# Patient Record
Sex: Female | Born: 1935 | Race: White | Hispanic: No | Smoking: Never smoker
Health system: Southern US, Community
[De-identification: ages and names within clinical notes are randomized; demographics above are authoritative.]

## PROBLEM LIST (undated history)

## (undated) DIAGNOSIS — I340 Nonrheumatic mitral (valve) insufficiency: Secondary | ICD-10-CM

## (undated) DIAGNOSIS — K5790 Diverticulosis of intestine, part unspecified, without perforation or abscess without bleeding: Secondary | ICD-10-CM

## (undated) DIAGNOSIS — D649 Anemia, unspecified: Secondary | ICD-10-CM

## (undated) DIAGNOSIS — L4050 Arthropathic psoriasis, unspecified: Secondary | ICD-10-CM

## (undated) DIAGNOSIS — Z86010 Personal history of colonic polyps: Secondary | ICD-10-CM

## (undated) DIAGNOSIS — Z860101 Personal history of adenomatous and serrated colon polyps: Secondary | ICD-10-CM

## (undated) DIAGNOSIS — C4491 Basal cell carcinoma of skin, unspecified: Secondary | ICD-10-CM

## (undated) DIAGNOSIS — M1990 Unspecified osteoarthritis, unspecified site: Secondary | ICD-10-CM

## (undated) DIAGNOSIS — M810 Age-related osteoporosis without current pathological fracture: Secondary | ICD-10-CM

## (undated) DIAGNOSIS — I10 Essential (primary) hypertension: Secondary | ICD-10-CM

## (undated) DIAGNOSIS — D126 Benign neoplasm of colon, unspecified: Secondary | ICD-10-CM

## (undated) DIAGNOSIS — K625 Hemorrhage of anus and rectum: Secondary | ICD-10-CM

## (undated) DIAGNOSIS — M064 Inflammatory polyarthropathy: Secondary | ICD-10-CM

## (undated) HISTORY — PX: BREAST EXCISIONAL BIOPSY: SUR124

## (undated) HISTORY — DX: Inflammatory polyarthropathy: M06.4

## (undated) HISTORY — DX: Hemorrhage of anus and rectum: K62.5

## (undated) HISTORY — DX: Arthropathic psoriasis, unspecified: L40.50

## (undated) HISTORY — DX: Personal history of adenomatous and serrated colon polyps: Z86.0101

## (undated) HISTORY — DX: Benign neoplasm of colon, unspecified: D12.6

## (undated) HISTORY — DX: Nonrheumatic mitral (valve) insufficiency: I34.0

## (undated) HISTORY — PX: COLONOSCOPY: SHX174

## (undated) HISTORY — DX: Unspecified osteoarthritis, unspecified site: M19.90

## (undated) HISTORY — PX: ESOPHAGOGASTRODUODENOSCOPY: SHX1529

## (undated) HISTORY — DX: Diverticulosis of intestine, part unspecified, without perforation or abscess without bleeding: K57.90

## (undated) HISTORY — PX: ABDOMINAL HYSTERECTOMY: SHX81

## (undated) HISTORY — DX: Basal cell carcinoma of skin, unspecified: C44.91

## (undated) HISTORY — PX: SKIN BIOPSY: SHX1

## (undated) HISTORY — DX: Age-related osteoporosis without current pathological fracture: M81.0

## (undated) HISTORY — DX: Personal history of colonic polyps: Z86.010

## (undated) HISTORY — DX: Essential (primary) hypertension: I10

## (undated) HISTORY — DX: Anemia, unspecified: D64.9

---

## 2004-05-13 ENCOUNTER — Ambulatory Visit: Payer: Self-pay | Admitting: Internal Medicine

## 2004-09-29 ENCOUNTER — Encounter: Payer: Self-pay | Admitting: Rheumatology

## 2004-10-14 ENCOUNTER — Encounter: Payer: Self-pay | Admitting: Rheumatology

## 2005-01-22 ENCOUNTER — Ambulatory Visit: Payer: Self-pay | Admitting: Ophthalmology

## 2005-01-28 ENCOUNTER — Ambulatory Visit: Payer: Self-pay | Admitting: Ophthalmology

## 2005-08-27 ENCOUNTER — Ambulatory Visit: Payer: Self-pay | Admitting: Internal Medicine

## 2005-09-07 ENCOUNTER — Ambulatory Visit: Payer: Self-pay | Admitting: Internal Medicine

## 2006-02-11 ENCOUNTER — Ambulatory Visit: Payer: Self-pay | Admitting: Ophthalmology

## 2006-02-17 ENCOUNTER — Other Ambulatory Visit: Payer: Self-pay

## 2006-02-17 ENCOUNTER — Ambulatory Visit: Payer: Self-pay | Admitting: Ophthalmology

## 2006-09-10 ENCOUNTER — Ambulatory Visit: Payer: Self-pay | Admitting: Internal Medicine

## 2006-12-06 ENCOUNTER — Ambulatory Visit: Payer: Self-pay | Admitting: Unknown Physician Specialty

## 2007-09-12 ENCOUNTER — Ambulatory Visit: Payer: Self-pay | Admitting: Internal Medicine

## 2008-09-12 ENCOUNTER — Ambulatory Visit: Payer: Self-pay | Admitting: Internal Medicine

## 2008-12-18 ENCOUNTER — Ambulatory Visit: Payer: Self-pay | Admitting: Unknown Physician Specialty

## 2009-11-27 ENCOUNTER — Other Ambulatory Visit: Payer: Self-pay | Admitting: Internal Medicine

## 2009-12-19 ENCOUNTER — Ambulatory Visit: Payer: Self-pay | Admitting: Internal Medicine

## 2010-03-16 HISTORY — PX: BREAST BIOPSY: SHX20

## 2011-01-22 ENCOUNTER — Ambulatory Visit: Payer: Self-pay | Admitting: Internal Medicine

## 2011-02-02 ENCOUNTER — Ambulatory Visit: Payer: Self-pay | Admitting: Internal Medicine

## 2011-03-03 ENCOUNTER — Ambulatory Visit: Payer: Self-pay | Admitting: Surgery

## 2011-03-17 HISTORY — PX: BREAST BIOPSY: SHX20

## 2011-03-30 ENCOUNTER — Ambulatory Visit: Payer: Self-pay | Admitting: Surgery

## 2011-04-03 LAB — PATHOLOGY REPORT

## 2012-01-25 ENCOUNTER — Ambulatory Visit: Payer: Self-pay | Admitting: Internal Medicine

## 2012-06-02 ENCOUNTER — Ambulatory Visit: Payer: Self-pay | Admitting: Unknown Physician Specialty

## 2013-01-25 ENCOUNTER — Ambulatory Visit: Payer: Self-pay | Admitting: Internal Medicine

## 2013-08-18 DIAGNOSIS — I10 Essential (primary) hypertension: Secondary | ICD-10-CM | POA: Diagnosis present

## 2013-08-18 DIAGNOSIS — I160 Hypertensive urgency: Secondary | ICD-10-CM | POA: Diagnosis present

## 2014-02-20 ENCOUNTER — Ambulatory Visit: Payer: Self-pay | Admitting: Internal Medicine

## 2014-07-08 NOTE — Op Note (Signed)
PATIENT NAME:  Lisa Koch, Lisa Koch MR#:  [id number] DATE OF BIRTH:  [birth year]  DATE OF PROCEDURE:  03/30/2011  PREOPERATIVE DIAGNOSIS: Left breast mass.   POSTOPERATIVE DIAGNOSIS: Left breast mass.   PROCEDURE: Excision of left breast mass.   SURGEON:  Rochel Brome, MD  ANESTHESIA: Local 1% Xylocaine with monitored anesthesia care.   INDICATIONS: This 81 year old female had a mammogram depicting a small nodule in the upper retroareolar portion of the left breast. She had a core needle biopsy which was suspicious of a lymphoproliferative disorder at this site possibly an intramammary lymph node and excision was recommended for further evaluation.   DESCRIPTION OF PROCEDURE: The patient had preoperative x-ray needle localization. The mammogram images were placed on a light box and viewed in the operating room prior to surgery. The Kopans wire entered the upper aspect left breast and extended towards the retroareolar portion of the breast. The dressing was removed from the left breast exposing the Kopans wire, which was cut 3 cm from the skin. The breast and surrounding chest wall were prepared with ChloraPrep and draped in a sterile manner.   After some intravenous sedation, the skin at the periareolar portion of the left breast from approximately 10:00 to 2:00 was infiltrated with 1% Xylocaine. Next a curvilinear incision was made just above the border of the areola and carried down through subcutaneous tissues to encounter the wire. Next, a sample of tissue which was directly behind the thick portion of the wire was excised. There was some firmness within the tissue demonstrated and a sample of tissue approximately 2 x 2 x 2 cm in dimension was removed. This included some fatty tissue and also some more dense breast tissue. Tissues were submitted for specimen mammogram and for routine pathology. The wound was inspected. Several small bleeding points were cauterized. Hemostasis was subsequently intact.  The wound was closed with a running 4-0 chromic subcuticular suture and Dermabond.   The patient tolerated surgery satisfactorily and was then prepared for transfer to the recovery room.    ____________________________ Lenna Sciara. Rochel Brome, MD [id number] D: 03/30/2011 11:59:07 ET T: 03/30/2011 12:56:19 ET JOB#: [id number]  cc: Loreli Dollar, MD, <Dictator> Loreli Dollar MD ELECTRONICALLY SIGNED 04/19/2011 15:08

## 2014-12-26 ENCOUNTER — Other Ambulatory Visit: Payer: Self-pay | Admitting: Internal Medicine

## 2014-12-26 DIAGNOSIS — Z1231 Encounter for screening mammogram for malignant neoplasm of breast: Secondary | ICD-10-CM

## 2015-02-22 ENCOUNTER — Other Ambulatory Visit: Payer: Self-pay | Admitting: Internal Medicine

## 2015-02-22 ENCOUNTER — Ambulatory Visit
Admission: RE | Admit: 2015-02-22 | Discharge: 2015-02-22 | Disposition: A | Discharge status: Home / Self Care | Payer: Commercial Managed Care - HMO | Source: Ambulatory Visit | Attending: Internal Medicine | Admitting: Internal Medicine

## 2015-02-22 DIAGNOSIS — Z1231 Encounter for screening mammogram for malignant neoplasm of breast: Secondary | ICD-10-CM

## 2015-02-22 DIAGNOSIS — Z9889 Other specified postprocedural states: Secondary | ICD-10-CM | POA: Insufficient documentation

## 2016-01-09 ENCOUNTER — Other Ambulatory Visit: Payer: Self-pay | Admitting: Internal Medicine

## 2016-01-09 DIAGNOSIS — R1031 Right lower quadrant pain: Secondary | ICD-10-CM

## 2016-01-09 DIAGNOSIS — R1032 Left lower quadrant pain: Principal | ICD-10-CM

## 2016-01-13 ENCOUNTER — Other Ambulatory Visit: Payer: Self-pay | Admitting: Internal Medicine

## 2016-01-13 DIAGNOSIS — R1010 Upper abdominal pain, unspecified: Secondary | ICD-10-CM

## 2016-01-14 ENCOUNTER — Ambulatory Visit
Admission: RE | Admit: 2016-01-14 | Discharge: 2016-01-14 | Disposition: A | Discharge status: Home / Self Care | Payer: Commercial Managed Care - HMO | Source: Ambulatory Visit | Attending: Internal Medicine | Admitting: Internal Medicine

## 2016-01-14 DIAGNOSIS — K8020 Calculus of gallbladder without cholecystitis without obstruction: Secondary | ICD-10-CM | POA: Diagnosis not present

## 2016-01-14 DIAGNOSIS — R1010 Upper abdominal pain, unspecified: Secondary | ICD-10-CM

## 2016-01-15 ENCOUNTER — Other Ambulatory Visit: Payer: Self-pay | Admitting: Internal Medicine

## 2016-01-15 DIAGNOSIS — Z1231 Encounter for screening mammogram for malignant neoplasm of breast: Secondary | ICD-10-CM

## 2016-02-24 ENCOUNTER — Ambulatory Visit
Admission: RE | Admit: 2016-02-24 | Discharge: 2016-02-24 | Disposition: A | Discharge status: Home / Self Care | Payer: Commercial Managed Care - HMO | Source: Ambulatory Visit | Attending: Internal Medicine | Admitting: Internal Medicine

## 2016-02-24 DIAGNOSIS — Z1231 Encounter for screening mammogram for malignant neoplasm of breast: Secondary | ICD-10-CM | POA: Diagnosis not present

## 2017-01-18 ENCOUNTER — Other Ambulatory Visit: Payer: Self-pay | Admitting: Internal Medicine

## 2017-01-18 DIAGNOSIS — Z1231 Encounter for screening mammogram for malignant neoplasm of breast: Secondary | ICD-10-CM

## 2017-03-12 ENCOUNTER — Ambulatory Visit
Admission: RE | Admit: 2017-03-12 | Discharge: 2017-03-12 | Disposition: A | Discharge status: Home / Self Care | Payer: Medicare HMO | Source: Ambulatory Visit | Attending: Internal Medicine | Admitting: Internal Medicine

## 2017-03-12 DIAGNOSIS — Z1231 Encounter for screening mammogram for malignant neoplasm of breast: Secondary | ICD-10-CM | POA: Diagnosis present

## 2018-01-27 ENCOUNTER — Other Ambulatory Visit: Payer: Self-pay | Admitting: Internal Medicine

## 2018-01-27 DIAGNOSIS — Z1231 Encounter for screening mammogram for malignant neoplasm of breast: Secondary | ICD-10-CM

## 2018-03-14 ENCOUNTER — Ambulatory Visit
Admission: RE | Admit: 2018-03-14 | Discharge: 2018-03-14 | Disposition: A | Discharge status: Home / Self Care | Payer: Medicare HMO | Source: Ambulatory Visit | Attending: Internal Medicine | Admitting: Internal Medicine

## 2018-03-14 DIAGNOSIS — Z1231 Encounter for screening mammogram for malignant neoplasm of breast: Secondary | ICD-10-CM | POA: Insufficient documentation

## 2019-02-15 ENCOUNTER — Other Ambulatory Visit: Payer: Self-pay | Admitting: Internal Medicine

## 2019-02-15 DIAGNOSIS — Z1231 Encounter for screening mammogram for malignant neoplasm of breast: Secondary | ICD-10-CM

## 2019-03-16 ENCOUNTER — Ambulatory Visit
Admission: RE | Admit: 2019-03-16 | Discharge: 2019-03-16 | Disposition: A | Discharge status: Home / Self Care | Payer: Medicare HMO | Source: Ambulatory Visit | Attending: Internal Medicine | Admitting: Internal Medicine

## 2019-03-16 DIAGNOSIS — Z1231 Encounter for screening mammogram for malignant neoplasm of breast: Secondary | ICD-10-CM

## 2020-02-05 ENCOUNTER — Other Ambulatory Visit: Payer: Self-pay | Admitting: Internal Medicine

## 2020-02-05 DIAGNOSIS — Z1231 Encounter for screening mammogram for malignant neoplasm of breast: Secondary | ICD-10-CM

## 2020-04-08 ENCOUNTER — Other Ambulatory Visit: Payer: Self-pay

## 2020-04-08 ENCOUNTER — Ambulatory Visit
Admission: RE | Admit: 2020-04-08 | Discharge: 2020-04-08 | Disposition: A | Discharge status: Home / Self Care | Payer: Medicare HMO | Source: Ambulatory Visit | Attending: Internal Medicine | Admitting: Internal Medicine

## 2020-04-08 DIAGNOSIS — Z1231 Encounter for screening mammogram for malignant neoplasm of breast: Secondary | ICD-10-CM | POA: Diagnosis present

## 2020-08-27 ENCOUNTER — Other Ambulatory Visit: Payer: Self-pay | Admitting: Internal Medicine

## 2020-08-27 DIAGNOSIS — M71352 Other bursal cyst, left hip: Secondary | ICD-10-CM

## 2020-08-31 ENCOUNTER — Emergency Department: Payer: Medicare HMO

## 2020-08-31 ENCOUNTER — Other Ambulatory Visit: Payer: Self-pay

## 2020-08-31 DIAGNOSIS — S59902A Unspecified injury of left elbow, initial encounter: Secondary | ICD-10-CM | POA: Diagnosis present

## 2020-08-31 DIAGNOSIS — S52022A Displaced fracture of olecranon process without intraarticular extension of left ulna, initial encounter for closed fracture: Secondary | ICD-10-CM | POA: Diagnosis not present

## 2020-08-31 DIAGNOSIS — W01198A Fall on same level from slipping, tripping and stumbling with subsequent striking against other object, initial encounter: Secondary | ICD-10-CM | POA: Insufficient documentation

## 2020-08-31 DIAGNOSIS — Z7982 Long term (current) use of aspirin: Secondary | ICD-10-CM | POA: Insufficient documentation

## 2020-08-31 DIAGNOSIS — Z79899 Other long term (current) drug therapy: Secondary | ICD-10-CM | POA: Diagnosis not present

## 2020-08-31 DIAGNOSIS — S0990XA Unspecified injury of head, initial encounter: Secondary | ICD-10-CM | POA: Insufficient documentation

## 2020-08-31 DIAGNOSIS — S51012A Laceration without foreign body of left elbow, initial encounter: Secondary | ICD-10-CM | POA: Diagnosis not present

## 2020-08-31 DIAGNOSIS — I10 Essential (primary) hypertension: Secondary | ICD-10-CM | POA: Insufficient documentation

## 2020-08-31 DIAGNOSIS — Z23 Encounter for immunization: Secondary | ICD-10-CM | POA: Diagnosis not present

## 2020-08-31 NOTE — ED Triage Notes (Signed)
Pt in from home after fall; got tripped up over a dog and fell on concrete. Pt denies blood thinners. Pt's left elbow wrapped in gauze due to injury; swollen and bleeding per son. Per son, pt has been acting her normal self since fall. Pt has hematoma x2 to back of head per family. Fall was unwitnessed per family. Pt's speech clear; pupils round equal and reactive; this RN assessed back of head--noted hematoma; no bleeding noted at head. Pt c/o pain at elbow but denies anywhere else. Pt denies HA and CP.

## 2020-09-01 ENCOUNTER — Emergency Department: Payer: Medicare HMO

## 2020-09-01 ENCOUNTER — Emergency Department
Admission: EM | Admit: 2020-09-01 | Discharge: 2020-09-01 | Disposition: A | Discharge status: Home / Self Care | Payer: Medicare HMO | Attending: Emergency Medicine | Admitting: Emergency Medicine

## 2020-09-01 DIAGNOSIS — S51012A Laceration without foreign body of left elbow, initial encounter: Secondary | ICD-10-CM

## 2020-09-01 DIAGNOSIS — W19XXXA Unspecified fall, initial encounter: Secondary | ICD-10-CM

## 2020-09-01 DIAGNOSIS — S52022A Displaced fracture of olecranon process without intraarticular extension of left ulna, initial encounter for closed fracture: Secondary | ICD-10-CM

## 2020-09-01 DIAGNOSIS — S0990XA Unspecified injury of head, initial encounter: Secondary | ICD-10-CM

## 2020-09-01 MED ORDER — ONDANSETRON 4 MG PO TBDP
4.0000 mg | ORAL_TABLET | Freq: Once | ORAL | Status: AC
  Administered 2020-09-01: 4 mg via ORAL
  Filled 2020-09-01: qty 1

## 2020-09-01 MED ORDER — ONDANSETRON 4 MG PO TBDP: 4.0000 mg | ORAL_TABLET | Freq: Four times a day (QID) | ORAL | 0 refills | Status: DC | PRN

## 2020-09-01 MED ORDER — TETANUS-DIPHTH-ACELL PERTUSSIS 5-2.5-18.5 LF-MCG/0.5 IM SUSY
0.5000 mL | PREFILLED_SYRINGE | Freq: Once | INTRAMUSCULAR | Status: AC
  Administered 2020-09-01: 0.5 mL via INTRAMUSCULAR
  Filled 2020-09-01: qty 0.5

## 2020-09-01 MED ORDER — BACITRACIN ZINC 500 UNIT/GM EX OINT
TOPICAL_OINTMENT | Freq: Once | CUTANEOUS | Status: AC
  Filled 2020-09-01: qty 1.8

## 2020-09-01 MED ORDER — HYDROCODONE-ACETAMINOPHEN 5-325 MG PO TABS
1.0000 | ORAL_TABLET | Freq: Once | ORAL | Status: AC
  Administered 2020-09-01: 1 via ORAL
  Filled 2020-09-01: qty 1

## 2020-09-01 MED ORDER — HYDROCODONE-ACETAMINOPHEN 5-325 MG PO TABS: 1.0000 | ORAL_TABLET | ORAL | 0 refills | Status: DC | PRN

## 2020-09-01 NOTE — Discharge Instructions (Addendum)
CT of your head and cervical spine showed no significant injury.  X-rays of your left shoulder and hand were normal today.  You do have a fracture and skin tear to the left elbow.  Please keep your splint on at all times and keep it clean and dry.  You may use your sling as needed for comfort.  Please contact orthopedics on Monday morning to schedule outpatient follow-up.   You are being provided a prescription for opiates (also known as narcotics) for pain control.  Opiates can be addictive and should only be used when absolutely necessary for pain control when other alternatives do not work.  We recommend you only use them for the recommended amount of time and only as prescribed.  Please do not take with other sedative medications or alcohol.  Please do not drive, operate machinery, make important decisions while taking opiates.  Please note that these medications can be addictive and have high abuse potential.  Patients can become addicted to narcotics after only taking them for a few days.  Please keep these medications locked away from children, teenagers or any family members with history of substance abuse.  Narcotic pain medicine may also make you constipated.  You may use over-the-counter medications such as MiraLAX, Colace to prevent constipation.  If you become constipated you may use over-the-counter enemas as needed.  Itching and nausea are common side effects of narcotic pain medication.  If you develop uncontrolled vomiting or a rash, please stop these medications.

## 2020-09-01 NOTE — ED Notes (Signed)
Discharge paperwork reviewed and discussed with patient and son. Denies any needs or questions at this time. Educated on the need for close follow up with PCP and orthopedics. Patient to lobby via wheelchair

## 2020-09-01 NOTE — ED Provider Notes (Signed)
[facility] Emergency Department Provider Note ____________________________________________   Event Date/Time   First MD Initiated Contact with Patient 09/01/20 [phone]     (approximate)  I have reviewed the triage vital signs and the nursing notes.   HISTORY  Chief Complaint Fall    HPI Lisa Koch is a 87 y.o. female with history of hypertension, psoriatic arthropathy, hearing loss who presents to the emergency department after she fell tonight after she tripped over her neighbors dog.  Complains of left elbow pain.  Had a skin tear to this area that was bandaged by her son.  Did hit her head but did not lose consciousness.  On aspirin but no other blood thinners.  No neck or back pain.  No numbness, tingling or weakness.  No preceding symptoms that led to her fall.  No chest pain or shortness of breath.         History reviewed. No pertinent past medical history.  There are no problems to display for this patient.   Past Surgical History:  Procedure Laterality Date   BREAST BIOPSY Left 2012   core - neg   BREAST BIOPSY Left 2013   excisional - neg   BREAST EXCISIONAL BIOPSY      Prior to Admission medications   Medication Sig Start Date End Date Taking? Authorizing Provider  HYDROcodone-acetaminophen (NORCO/VICODIN) 5-325 MG tablet Take 1 tablet by mouth every 4 (four) hours as needed. 09/01/20  Yes Naziyah Tieszen, Cyril Mourning N, DO  ondansetron (ZOFRAN ODT) 4 MG disintegrating tablet Take 1 tablet (4 mg total) by mouth every 6 (six) hours as needed for nausea or vomiting. 09/01/20  Yes Mylah Baynes, Delice Bison, DO  aspirin 81 MG EC tablet Take 81 mg by mouth daily.    [provider]  folic acid (FOLVITE) 1 MG tablet Take 2 mg by mouth daily.    [provider]  hydrochlorothiazide (HYDRODIURIL) 12.5 MG tablet Take 12.5 mg by mouth every other day. 06/29/20   [provider]  lisinopril (ZESTRIL) 40 MG tablet Take 40 mg by mouth daily.  06/19/20   [provider]  methotrexate (RHEUMATREX) 2.5 MG tablet Take 10 mg by mouth once a week. 08/05/20   [provider]  omeprazole (PRILOSEC) 10 MG capsule Take 10 mg by mouth daily. 07/03/20   [provider]  Potassium Chloride ER 20 MEQ TBCR Take 20 mEq by mouth daily. 06/20/20   [provider]  verapamil (CALAN-SR) 240 MG CR tablet Take 240 mg by mouth daily. 06/17/20   [provider]    Allergies Patient has no allergy information on record.  Family History  Problem Relation Age of Onset   Breast cancer Neg Hx     Social History Social History   Tobacco Use   Smoking status: Never   Smokeless tobacco: Never  Substance Use Topics   Alcohol use: Never   Drug use: Never    Review of Systems Constitutional: No fever. Eyes: No visual changes. ENT: No sore throat. Cardiovascular: Denies chest pain. Respiratory: Denies shortness of breath. Gastrointestinal: No nausea, vomiting, diarrhea. Genitourinary: Negative for dysuria. Musculoskeletal: Negative for back pain. Skin: Negative for rash. Neurological: Negative for focal weakness or numbness.   ____________________________________________   PHYSICAL EXAM:  VITAL SIGNS: ED Triage Vitals  Enc Vitals Group     BP 08/31/20 2221 (!) 186/90     Pulse Rate 08/31/20 2221 65     Resp 08/31/20 2221 17  Temp 08/31/20 2221 98.2 F (36.8 C)     Temp Source 08/31/20 2221 Oral     SpO2 08/31/20 2221 99 %     Weight 08/31/20 2223 133 lb (60.3 kg)     Height 08/31/20 2223 5' (1.524 m)     Head Circumference --      Peak Flow --      Pain Score 08/31/20 2222 9     Pain Loc --      Pain Edu? --      Excl. in [place]? --    CONSTITUTIONAL: Alert and oriented and responds appropriately to questions. Well-appearing; well-nourished; GCS 19, elderly, hard of hearing HEAD: Normocephalic; hematoma to the posterior scalp, no laceration EYES: Conjunctivae clear, PERRL, EOMI ENT: normal  nose; no rhinorrhea; moist mucous membranes; pharynx without lesions noted; no dental injury; no septal hematoma NECK: Supple, no meningismus, no LAD; no midline spinal tenderness, step-off or deformity; trachea midline CARD: RRR; S1 and S2 appreciated; no murmurs, no clicks, no rubs, no gallops RESP: Normal chest excursion without splinting or tachypnea; breath sounds clear and equal bilaterally; no wheezes, no rhonchi, no rales; no hypoxia or respiratory distress CHEST:  chest wall stable, no crepitus or ecchymosis or deformity, nontender to palpation; no flail chest ABD/GI: Normal bowel sounds; non-distended; soft, non-tender, no rebound, no guarding; no ecchymosis or other lesions noted PELVIS:  stable, nontender to palpation, no leg length discrepancy BACK:  The back appears normal and is non-tender to palpation, there is no CVA tenderness; no midline spinal tenderness, step-off or deformity EXT: Patient has a skin tear to the left posterior elbow with associated ecchymosis, soft tissue swelling and bony tenderness without deformity.  2+ radial pulses bilaterally.  Patient also has bruising and tenderness to the left fifth digit.  Also tender to palpation without deformity, soft tissue swelling or ecchymosis to the left shoulder.  Full range of motion in all joints.  Otherwise extremities are nontender to palpation. SKIN: Normal color for age and race; warm NEURO: Moves all extremities equally, normal speech, normal gait PSYCH: The patient's mood and manner are appropriate. Grooming and personal hygiene are appropriate.      Left elbow  Patient gave verbal permission to utilize photo for medical documentation only. The image was not stored on any personal device.  ____________________________________________   LABS (all labs ordered are listed, but only abnormal results are displayed)  Labs Reviewed - No data to  display ____________________________________________  EKG   ____________________________________________  RADIOLOGY I, Deaken Jurgens, personally viewed and evaluated these images (plain radiographs) as part of my medical decision making, as well as reviewing the written report by the radiologist.  ED MD interpretation: X-ray showed fracture of the olecranon.  CT head and cervical spine show no acute abnormality.  Official radiology report(s): DG Elbow Complete Left  Result Date: 08/31/2020 CLINICAL DATA:  [place], left elbow pain EXAM: LEFT ELBOW - COMPLETE 3+ VIEW COMPARISON:  None. FINDINGS: Frontal, bilateral oblique, lateral views of the left elbow are obtained. There is a mildly comminuted fracture through the olecranon, with significant distraction. Fracture fragments are separated by approximately 15 mm. No dislocation. Small joint effusion. Prominent dorsal soft tissue swelling. IMPRESSION: 1. Mildly comminuted intra-articular fracture of the olecranon, with approximately 15 mm of distraction of the fracture fragments. 2. Small joint effusion. 3. Dorsal soft tissue swelling. Electronically Signed   By: Randa Ngo M.D.   On: 08/31/2020 23:14   CT Head Wo Contrast  Result  Date: 08/31/2020 CLINICAL DATA:  Tripped over dog at home leading to fall onto concrete. Head trauma. EXAM: CT HEAD WITHOUT CONTRAST TECHNIQUE: Contiguous axial images were obtained from the base of the skull through the vertex without intravenous contrast. COMPARISON:  None. FINDINGS: Brain: Age related atrophy. Moderate periventricular and deep chronic small vessel ischemia. No intracranial hemorrhage, mass effect, or midline shift. No hydrocephalus. The basilar cisterns are patent. No evidence of territorial infarct or acute ischemia. No extra-axial or intracranial fluid collection. Vascular: No hyperdense vessel. Skull: No fracture or focal lesion. Sinuses/Orbits: Paranasal sinuses and mastoid air cells are clear. The  visualized orbits are unremarkable. Bilateral cataract resection. Other: Left parietal scalp hematoma. IMPRESSION: 1. Left parietal scalp hematoma. No acute intracranial abnormality. No skull fracture. 2. Age related atrophy and chronic small vessel ischemia. Electronically Signed   By: Keith Rake M.D.   On: 08/31/2020 23:30   CT Cervical Spine Wo Contrast  Result Date: 08/31/2020 CLINICAL DATA:  Tripped over dog at home leading to fall onto concrete. Neck trauma. EXAM: CT CERVICAL SPINE WITHOUT CONTRAST TECHNIQUE: Multidetector CT imaging of the cervical spine was performed without intravenous contrast. Multiplanar CT image reconstructions were also generated. COMPARISON:  None. FINDINGS: Alignment: Reversal of upper lordosis with slight anterolisthesis of C2 on C3, C3 on C4, and C4 on C5. No jumped or perched facets. No traumatic subluxation. Skull base and vertebrae: No acute fracture. Vertebral body heights are maintained. The dens and skull base are intact. Soft tissues and spinal canal: No prevertebral fluid or swelling. No visible canal hematoma. Disc levels: Multilevel degenerative disc disease, most prominently affecting C5-C6 and C6-C7. There is multilevel facet hypertrophy. Upper chest: No acute or unexpected findings. Other: None. IMPRESSION: 1. No acute fracture or subluxation of the cervical spine. 2. Multilevel degenerative disc disease and facet hypertrophy. Electronically Signed   By: Keith Rake M.D.   On: 08/31/2020 23:34   DG Shoulder Left  Result Date: 09/01/2020 CLINICAL DATA:  87 year old female tripped over dog and fell on concrete. EXAM: LEFT SHOULDER - 2+ VIEW COMPARISON:  None available. FINDINGS: Bone mineralization is within normal limits for age. No glenohumeral joint dislocation. Proximal left humerus intact. Left clavicle and scapula appear intact with mild to moderate AC joint degeneration. Visible left ribs and lung appear negative. IMPRESSION: No acute fracture  or dislocation identified about the left shoulder. Electronically Signed   By: Genevie Ann M.D.   On: 09/01/2020 06:09   DG Hand Complete Left  Result Date: 09/01/2020 CLINICAL DATA:  87 year old female tripped over dog and fell on concrete. EXAM: LEFT HAND - COMPLETE 3+ VIEW COMPARISON:  None. FINDINGS: Arthropathic changes throughout the left hand. 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 Distal radius and ulna appear intact. No carpal bone fracture. Severe osteoarthritis at the [facility]st [facility] joint, thumb IP joint. No metacarpal or phalanx fracture identified. IMPRESSION: Advanced arthropathic changes in the left hand. No acute fracture or dislocation identified. Electronically Signed   By: Genevie Ann M.D.   On: 09/01/2020 06:08    ____________________________________________   PROCEDURES  Procedure(s) performed (including Critical Care):  Procedures  SPLINT APPLICATION Date/Time: 5:17 AM Authorized by: Cyril Mourning Ellanore Vanhook Consent: Verbal consent obtained. Risks and benefits: risks, benefits and alternatives were discussed Consent given by: patient Splint applied by: technician Location details: Left arm Splint type: Posterior splint with sling Supplies used: Fiberglas, cotton padding, Ace wrap, sling Post-procedure: The splinted body part was neurovascularly unchanged following the procedure. Patient tolerance:  Patient tolerated the procedure well with no immediate complications.   ____________________________________________   INITIAL IMPRESSION / ASSESSMENT AND PLAN / ED COURSE  As part of my medical decision making, I reviewed the following data within the [place] History obtained from family, Nursing notes reviewed and incorporated, Radiograph reviewed , Notes from prior ED visits, and [place] Controlled Substance Database         Patient here with mechanical fall.  Has left olecranon fracture.  Has overlying skin tear but no signs of an open fracture.  Neurovascularly intact  distally.  CT head and cervical spine showed no intracranial abnormality, cervical spine fracture or dislocation.  Will update tetanus vaccination, clean wound and apply sterile nonadherent dressing.  Will place patient in a posterior left long-arm splint with sling.  Complaining of left fifth digit pain as well as left shoulder pain.  Will obtain x-rays of these areas.  No other signs of traumatic injury on exam.  Will give pain medication.   ED PROGRESS  Patient's x-ray of the hand and shoulder show no acute traumatic injury.  Patient is in posterior splint with sling.  Continues to be neurovascularly intact distally.  Hemodynamically stable without complaints.  Pain well controlled with Vicodin.  Will discharge home with outpatient orthopedic follow-up and prescription for pain and nausea medicine.  Discussed return precautions with patient and son at bedside.  At this time, I do not feel there is any life-threatening condition present. I have reviewed, interpreted and discussed all results (EKG, imaging, lab, urine as appropriate) and exam findings with patient/family. I have reviewed nursing notes and appropriate previous records.  I feel the patient is safe to be discharged home without further emergent workup and can continue workup as an outpatient as needed. Discussed usual and customary return precautions. Patient/family verbalize understanding and are comfortable with this plan.  Outpatient follow-up has been provided as needed. All questions have been answered.    ____________________________________________   FINAL CLINICAL IMPRESSION(S) / ED DIAGNOSES  Final diagnoses:  Fall, initial encounter  Injury of head, initial encounter  Skin tear of left elbow without complication, initial encounter  Closed olecranon fracture, left, initial encounter     ED Discharge Orders          Ordered    HYDROcodone-acetaminophen (NORCO/VICODIN) 5-325 MG tablet  Every 4 hours PRN         09/01/20 0721    ondansetron (ZOFRAN ODT) 4 MG disintegrating tablet  Every 6 hours PRN        09/01/20 4098            *Please note:  Estelle June was evaluated in Emergency Department on 09/01/2020 for the symptoms described in the history of present illness. She was evaluated in the context of the global COVID-19 pandemic, which necessitated consideration that the patient might be at risk for infection with the SARS-CoV-2 virus that causes COVID-19. Institutional protocols and algorithms that pertain to the evaluation of patients at risk for COVID-19 are in a state of rapid change based on information released by regulatory bodies including the CDC and federal and state organizations. These policies and algorithms were followed during the patient's care in the ED.  Some ED evaluations and interventions may be delayed as a result of limited staffing during and the pandemic.*   Note:  This document was prepared using Dragon voice recognition software and may include unintentional dictation errors.    Meka Lewan, Delice Bison, DO 09/01/20  0724  

## 2020-09-03 ENCOUNTER — Ambulatory Visit: Payer: Self-pay | Admitting: *Deleted

## 2020-09-03 NOTE — Telephone Encounter (Signed)
Reason for Disposition  Small cut (scratch) or abrasion (scrape) is also present  Answer Assessment - Initial Assessment Questions 1. MECHANISM: "How did the injury happen?"     Son Lisa Koch calling in.   She fell on Saturday and broke her elbow.   Went to the ED.   There is a torn skin flap on her elbow.   I stopped her 81 mg of aspirin today.    She's on 2 BP medications.  2. ONSET: "When did the injury happen?" (Minutes or hours ago)      Saturday  She fell and broke her elbow 3. LOCATION: "What part of the elbow is the injured?"      It's broken       They drsged the wound in the ED.   It bled through in the ED so they reinforced it while in the ED. 4. APPEARANCE of INJURY: "What does the injury look like?"      I had to reinforce the drsg.     She has an appt with the ortho surgery guy tomorrow.    She's bleeding.   She was knocked down by a dog while watering her flowers on Saturday. 5. SEVERITY: "Can you use the elbow normally?"  "Can you bend it and straighten it fully?"     It's in a long arm splint.   6. SIZE: For cuts, bruises, or swelling, ask: "How large is it?" (e.g., inches or centimeters; entire joint)      It's bleeding.   I've reinforced the drsg.    7. PAIN: "Is there pain?" If Yes, ask: "How bad is the pain?"    (Scale 1-10; or mild, moderate, severe)   - NONE (0): no pain.   - MILD (1-3): doesn't interfere with normal activities.   - MODERATE (4-7): interferes with normal activities (e.g., work or school) or awakens from sleep.   - SEVERE (8-10): excruciating pain, unable to do any normal activities, unable to use arm at all.     Burning where skin is gone 8. TETANUS: For any breaks in the skin, ask: "When was the last tetanus booster?"     Seen in the ED 9. OTHER SYMPTOMS: "Do you have any other symptoms?"  (e.g., numbness in hand)     Denies having dizziness or weakness 10. PREGNANCY: "Is there any chance you are pregnant?" "When was your last menstrual period?"        N/A due to age  Protocols used: Elbow Injury-A-AH

## 2020-09-03 NOTE — Telephone Encounter (Signed)
Pt's son called in [place].  His mother was knocked down by a dog on Saturday.   She has a broken elbow and a skin flap that was torn during the fall.   She was treated in the ED at [facility].   She has a broken elbow and it's in a splint.   It is slowly bleeding through the drsg.   Jenny Reichmann has reinforced it.   "It's not saturated but it's a slow bleeding".   "I stopped her 81 mg a day aspirin today".    She is not on any other blood thinners.   Just 2 BP medications.   She has an appt tomorrow at 2:30 with the [facility].    I know the directions were not to disturb the splint or wound until seen by the ortho dr.     I let him know about the walk in clinic [facility] has that is open until 9:00 PM.  I suggested he take her there if she continued to bleed or started c/o being dizzy or weak.    "She is acting fine".   "She's really doing fantastic".   "Her only complaint is the wound where the skin has been torn is burning".   "There is a big loose flap of skin that was torn over the elbow".    "That's what was bleeding in the ED".   John was agreeable to taking her to the [facility] walk in clinic if it was needed.   He thanked me greatly for my help and advice.   He wasn't aware of the walk in clinic available through [facility].   He has to take his mother to an eye dr appt where she gets a special shot in her eye for macular degeneration todaay.

## 2020-09-05 ENCOUNTER — Ambulatory Visit: Payer: Medicare HMO

## 2021-03-03 ENCOUNTER — Other Ambulatory Visit: Payer: Self-pay | Admitting: Internal Medicine

## 2021-03-03 DIAGNOSIS — Z1231 Encounter for screening mammogram for malignant neoplasm of breast: Secondary | ICD-10-CM

## 2021-05-21 ENCOUNTER — Ambulatory Visit
Admission: RE | Admit: 2021-05-21 | Discharge: 2021-05-21 | Disposition: A | Discharge status: Home / Self Care | Payer: Medicare HMO | Source: Ambulatory Visit | Attending: Internal Medicine | Admitting: Internal Medicine

## 2021-05-21 ENCOUNTER — Other Ambulatory Visit: Payer: Self-pay

## 2021-05-21 DIAGNOSIS — Z1231 Encounter for screening mammogram for malignant neoplasm of breast: Secondary | ICD-10-CM | POA: Insufficient documentation

## 2021-07-02 DIAGNOSIS — Z9181 History of falling: Secondary | ICD-10-CM

## 2021-12-14 ENCOUNTER — Emergency Department
Admission: EM | Admit: 2021-12-14 | Discharge: 2021-12-14 | Disposition: A | Discharge status: Home / Self Care | Payer: Medicare HMO | Source: Home / Self Care | Attending: Emergency Medicine | Admitting: Emergency Medicine

## 2021-12-14 ENCOUNTER — Other Ambulatory Visit: Payer: Self-pay

## 2021-12-14 ENCOUNTER — Encounter: Payer: Self-pay | Admitting: Radiology

## 2021-12-14 ENCOUNTER — Emergency Department: Payer: Medicare HMO

## 2021-12-14 DIAGNOSIS — R110 Nausea: Secondary | ICD-10-CM | POA: Insufficient documentation

## 2021-12-14 DIAGNOSIS — S0001XA Abrasion of scalp, initial encounter: Secondary | ICD-10-CM

## 2021-12-14 DIAGNOSIS — E871 Hypo-osmolality and hyponatremia: Secondary | ICD-10-CM | POA: Diagnosis not present

## 2021-12-14 DIAGNOSIS — I272 Pulmonary hypertension, unspecified: Secondary | ICD-10-CM | POA: Insufficient documentation

## 2021-12-14 DIAGNOSIS — Y92000 Kitchen of unspecified non-institutional (private) residence as  the place of occurrence of the external cause: Secondary | ICD-10-CM | POA: Insufficient documentation

## 2021-12-14 DIAGNOSIS — W2209XA Striking against other stationary object, initial encounter: Secondary | ICD-10-CM | POA: Insufficient documentation

## 2021-12-14 DIAGNOSIS — Y93G1 Activity, food preparation and clean up: Secondary | ICD-10-CM | POA: Insufficient documentation

## 2021-12-14 DIAGNOSIS — N390 Urinary tract infection, site not specified: Secondary | ICD-10-CM

## 2021-12-14 DIAGNOSIS — S0101XA Laceration without foreign body of scalp, initial encounter: Secondary | ICD-10-CM | POA: Insufficient documentation

## 2021-12-14 DIAGNOSIS — W19XXXA Unspecified fall, initial encounter: Secondary | ICD-10-CM

## 2021-12-14 DIAGNOSIS — Z7982 Long term (current) use of aspirin: Secondary | ICD-10-CM | POA: Insufficient documentation

## 2021-12-14 LAB — COMPREHENSIVE METABOLIC PANEL
ALT: 25 U/L (ref 0–44)
AST: 28 U/L (ref 15–41)
Albumin: 3.8 g/dL (ref 3.5–5.0)
Alkaline Phosphatase: 68 U/L (ref 38–126)
Anion gap: 8 (ref 5–15)
BUN: 28 mg/dL — ABNORMAL HIGH (ref 8–23)
CO2: 24 mmol/L (ref 22–32)
Calcium: 9.3 mg/dL (ref 8.9–10.3)
Chloride: 100 mmol/L (ref 98–111)
Creatinine, Ser: 1.09 mg/dL — ABNORMAL HIGH (ref 0.44–1.00)
GFR, Estimated: 47 mL/min — ABNORMAL LOW (ref >60)
Glucose, Bld: 237 mg/dL — ABNORMAL HIGH (ref 70–99)
Potassium: 4.2 mmol/L (ref 3.5–5.1)
Sodium: 132 mmol/L — ABNORMAL LOW (ref 135–145)
Total Bilirubin: 0.6 mg/dL (ref 0.3–1.2)
Total Protein: 6.2 g/dL — ABNORMAL LOW (ref 6.5–8.1)

## 2021-12-14 LAB — TROPONIN I (HIGH SENSITIVITY): Troponin I (High Sensitivity): 10 ng/L (ref <18)

## 2021-12-14 LAB — URINALYSIS, ROUTINE W REFLEX MICROSCOPIC
Bilirubin Urine: NEGATIVE
Glucose, UA: NEGATIVE mg/dL
Hgb urine dipstick: NEGATIVE
Ketones, ur: NEGATIVE mg/dL
Nitrite: NEGATIVE
Protein, ur: NEGATIVE mg/dL
Specific Gravity, Urine: 1.018 (ref 1.005–1.030)
Squamous Epithelial / HPF: NONE SEEN (ref 0–5)
WBC, UA: >50 WBC/hpf — ABNORMAL HIGH (ref 0–5)
pH: 5 (ref 5.0–8.0)

## 2021-12-14 LAB — CBC WITH DIFFERENTIAL/PLATELET
Abs Immature Granulocytes: 0.27 10*3/uL — ABNORMAL HIGH (ref 0.00–0.07)
Basophils Absolute: 0.1 10*3/uL (ref 0.0–0.1)
Basophils Relative: 0 %
Eosinophils Absolute: 0.1 10*3/uL (ref 0.0–0.5)
Eosinophils Relative: 1 %
HCT: 37.7 % (ref 36.0–46.0)
Hemoglobin: 12.7 g/dL (ref 12.0–15.0)
Immature Granulocytes: 2 %
Lymphocytes Relative: 8 %
Lymphs Abs: 1.1 10*3/uL (ref 0.7–4.0)
MCH: 32.6 pg (ref 26.0–34.0)
MCHC: 33.7 g/dL (ref 30.0–36.0)
MCV: 96.9 fL (ref 80.0–100.0)
Monocytes Absolute: 1.3 10*3/uL — ABNORMAL HIGH (ref 0.1–1.0)
Monocytes Relative: 9 %
Neutro Abs: 11.1 10*3/uL — ABNORMAL HIGH (ref 1.7–7.7)
Neutrophils Relative %: 80 %
Platelets: 200 10*3/uL (ref 150–400)
RBC: 3.89 MIL/uL (ref 3.87–5.11)
RDW: 14.1 % (ref 11.5–15.5)
WBC: 13.9 10*3/uL — ABNORMAL HIGH (ref 4.0–10.5)
nRBC: 0 % (ref 0.0–0.2)

## 2021-12-14 LAB — LIPASE, BLOOD: Lipase: 47 U/L (ref 11–51)

## 2021-12-14 MED ORDER — ACETAMINOPHEN 325 MG PO TABS
650.0000 mg | ORAL_TABLET | Freq: Once | ORAL | Status: AC
  Administered 2021-12-14: 650 mg via ORAL
  Filled 2021-12-14: qty 2

## 2021-12-14 MED ORDER — SODIUM CHLORIDE 0.9 % IV SOLN
1.0000 g | Freq: Once | INTRAVENOUS | Status: AC
  Administered 2021-12-14: 1 g via INTRAVENOUS
  Filled 2021-12-14: qty 10

## 2021-12-14 MED ORDER — CEPHALEXIN 250 MG PO CAPS: 250.0000 mg | ORAL_CAPSULE | Freq: Three times a day (TID) | ORAL | 0 refills | Status: DC

## 2021-12-14 NOTE — ED Provider Notes (Signed)
[facility] Provider Note    Event Date/Time   First MD Initiated Contact with Patient 12/14/21 0012     (approximate)   History   Fall   HPI  Lisa Koch is a 88 y.o. female brought to the ED via EMS from home status post fall.  Patient was in the kitchen making spaghetti when she felt nauseated and weak in her knees.  She began to sit down but fell, striking her posterior head on a doorknob.  Tetanus is up-to-date.  Presents with laceration to her scalp.  Denies recent fever, cough, chest pain, shortness of breath, abdominal pain, vomiting, dysuria or diarrhea.  Takes a baby aspirin daily.     Past Medical History  Adenomatous polyps  Allergic state  Anemia, unspecified  Benign neoplasm of colon  Degenerative arthritis  Diverticulosis  Essential hypertension, malignant  Hemorrhage of rectum and anus  Hx of adenomatous colonic polyps  Hx of adenomatous colonic polyps  Inflammatory arthritis ([place]) 10/17/2013  a. Psoriasis/positive rheumatoid factor, low titer negative anti-CCP antibodies. b. Sulfasalazine c. Methotrexate.  Mitral insufficiency ([place]) 10/17/2013  a. Pulmonary hypertension.  Osteoarthrosis involving, or with mention of more than one site, but not specified as generalized, multiple sites  Osteoporosis ([place]) 10/17/2013  a. Fosamax 8 years.  Psoriatic arthropathy (CMS-HCC)  Senile osteoporosis  Unspecified inflammatory polyarthropathy (CMS-HCC)     Active Problem List  There are no problems to display for this patient.    Past Surgical History   Past Surgical History:  Procedure Laterality Date  . BREAST BIOPSY Left 2012   core - neg  . BREAST BIOPSY Left 2013   excisional - neg  . BREAST EXCISIONAL BIOPSY       Home Medications   Prior to Admission medications   Medication Sig Start Date End Date Taking? Authorizing Provider  aspirin 81 MG EC tablet Take 81 mg by mouth daily.    [provider]  folic acid  (FOLVITE) 1 MG tablet Take 2 mg by mouth daily.    [provider]  hydrochlorothiazide (HYDRODIURIL) 12.5 MG tablet Take 12.5 mg by mouth every other day. 06/29/20   [provider]  HYDROcodone-acetaminophen (NORCO/VICODIN) 5-325 MG tablet Take 1 tablet by mouth every 4 (four) hours as needed. 09/01/20   Ward, Delice Bison, DO  lisinopril (ZESTRIL) 40 MG tablet Take 40 mg by mouth daily. 06/19/20   [provider]  methotrexate (RHEUMATREX) 2.5 MG tablet Take 10 mg by mouth once a week. 08/05/20   [provider]  omeprazole (PRILOSEC) 10 MG capsule Take 10 mg by mouth daily. 07/03/20   [provider]  ondansetron (ZOFRAN ODT) 4 MG disintegrating tablet Take 1 tablet (4 mg total) by mouth every 6 (six) hours as needed for nausea or vomiting. 09/01/20   Ward, Delice Bison, DO  Potassium Chloride ER 20 MEQ TBCR Take 20 mEq by mouth daily. 06/20/20   [provider]  verapamil (CALAN-SR) 240 MG CR tablet Take 240 mg by mouth daily. 06/17/20   [provider]     Allergies  Patient has no allergy information on record.   Family History   Family History  Problem Relation Age of Onset  . Breast cancer Neg Hx      Physical Exam  Triage Vital Signs: ED Triage Vitals  Enc Vitals Group     BP 12/14/21 0008 (!) 146/74     Pulse Rate 12/14/21 0008 70  Resp 12/14/21 0008 18     Temp 12/14/21 0008 98.6 F (37 C)     Temp Source 12/14/21 0008 Oral     SpO2 12/14/21 0008 100 %     Weight 12/14/21 0007 130 lb (59 kg)     Height 12/14/21 0007 5' (1.524 m)     Head Circumference --      Peak Flow --      Pain Score --      Pain Loc --      Pain Edu? --      Excl. in [place]? --     Updated Vital Signs: BP (!) 160/71   Pulse 68   Temp 98.6 F (37 C) (Oral)   Resp 16   Ht 5' (1.524 m)   Wt 59 kg   SpO2 97%   BMI 25.39 kg/m    General: Awake, no distress.  Hard of hearing. CV:  RRR.  Good peripheral perfusion.  Resp:  Normal effort.   CTA B. Abd:  Nontender.  No distention.  Other:  Bloodied right parietal scalp which will be reexamined after cleaning.  Nose is atraumatic.  No dental malocclusion.  No cervical spine tenderness to palpation.  Pelvis is stable.  Full range of hips bilaterally without pain.  Alert and oriented x3.  CN II-XII grossly intact.  5/5 motor strength and sensation all extremities. MAEx4.   ED Results / Procedures / Treatments  Labs (all labs ordered are listed, but only abnormal results are displayed) Labs Reviewed  CBC WITH DIFFERENTIAL/PLATELET - Abnormal; Notable for the following components:      Result Value   WBC 13.9 (*)    Neutro Abs 11.1 (*)    Monocytes Absolute 1.3 (*)    Abs Immature Granulocytes 0.27 (*)    All other components within normal limits  COMPREHENSIVE METABOLIC PANEL - Abnormal; Notable for the following components:   Sodium 132 (*)    Glucose, Bld 237 (*)    BUN 28 (*)    Creatinine, Ser 1.09 (*)    Total Protein 6.2 (*)    GFR, Estimated 47 (*)    All other components within normal limits  URINALYSIS, ROUTINE W REFLEX MICROSCOPIC - Abnormal; Notable for the following components:   Color, Urine YELLOW (*)    APPearance HAZY (*)    Leukocytes,Ua LARGE (*)    WBC, UA >50 (*)    Bacteria, UA RARE (*)    Non Squamous Epithelial PRESENT (*)    All other components within normal limits  URINE CULTURE  LIPASE, BLOOD  TROPONIN I (HIGH SENSITIVITY)     EKG  ED ECG REPORT I, Jj Enyeart J, the attending physician, personally viewed and interpreted this ECG.   Date: 12/14/2021  EKG Time: 0013  Rate: 66  Rhythm: normal sinus rhythm  Axis: Normal  Intervals:nonspecific intraventricular conduction delay  ST&T Change: Nonspecific    RADIOLOGY I have independently visualized and interpreted patient's CT head, cervical spine and chest x-ray as well as noted the radiology interpretation:  CT head: No ICH  CT cervical spine: No acute fracture or  dislocation  Chest x-ray: No acute cardiopulmonary process  Thoracic spine x-ray: Mild vertebral body height loss at T4 and G95 of uncertain chronicity  Lumbar spine x-ray: Mild degenerative change without acute abnormality  Official radiology report(s): DG Lumbar Spine Complete  Result Date: 12/14/2021 CLINICAL DATA:  Recent fall with back pain, initial encounter EXAM: LUMBAR SPINE - COMPLETE 4+ VIEW  COMPARISON:  None Available. FINDINGS: Five lumbar type vertebral bodies are well visualized. Vertebral body height is well maintained. No anterolisthesis is noted. Disc space narrowing with osteophytic changes is seen at L4-5. No compression deformity is seen. IMPRESSION: Mild degenerative change without acute abnormality. Electronically Signed   By: Inez Catalina M.D.   On: 12/14/2021 02:14   DG Thoracic Spine 2 View  Result Date: 12/14/2021 CLINICAL DATA:  Recent fall with back pain, initial encounter EXAM: THORACIC SPINE 2 VIEWS COMPARISON:  None Available. FINDINGS: Pedicles are within normal limits and no paraspinal mass lesion is noted. Mild vertebral body height loss is noted in the upper thoracic spine at T4. Mild height loss is also noted at T10. These changes are of uncertain chronicity. IMPRESSION: Height loss of T4 and P50 of uncertain chronicity. Correlate to point tenderness. Nonemergent MRI may be helpful as clinically necessary. Electronically Signed   By: Inez Catalina M.D.   On: 12/14/2021 02:13   CT Head Wo Contrast  Result Date: 12/14/2021 CLINICAL DATA:  Fall EXAM: CT HEAD WITHOUT CONTRAST CT CERVICAL SPINE WITHOUT CONTRAST TECHNIQUE: Multidetector CT imaging of the head and cervical spine was performed following the standard protocol without intravenous contrast. Multiplanar CT image reconstructions of the cervical spine were also generated. RADIATION DOSE REDUCTION: This exam was performed according to the departmental dose-optimization program which includes automated exposure  control, adjustment of the mA and/or kV according to patient size and/or use of iterative reconstruction technique. COMPARISON:  08/31/2020 CT head and cervical spine. FINDINGS: CT HEAD FINDINGS Brain: No evidence of acute infarct, hemorrhage, mass, mass effect, or midline shift. No hydrocephalus or extra-axial fluid collection. Periventricular white matter changes, likely the sequela of chronic small vessel ischemic disease. Age related cerebral atrophy. Vascular: No hyperdense vessel. Skull: Normal. Negative for fracture or focal lesion. Sinuses/Orbits: Mucous retention cyst in the right maxillary sinus. Status post bilateral lens replacements. Other: The mastoid air cells are well aerated. CT CERVICAL SPINE FINDINGS Alignment: Unchanged reversal of the normal cervical lordosis with trace anterolisthesis of C2 on C3, C3 on C4, and C4 on C5. No traumatic listhesis. Skull base and vertebrae: No acute fracture. No primary bone lesion or focal pathologic process. Soft tissues and spinal canal: No prevertebral fluid or swelling. No visible canal hematoma. Disc levels: Multilevel degenerative changes, most prominent C5-C6 and C6-C7. Multilevel facet and uncovertebral hypertrophy. Upper chest: No focal pulmonary opacity or pleural effusion. Other: None. IMPRESSION: 1. No acute intracranial process. 2. No acute fracture or traumatic listhesis in the cervical spine. Electronically Signed   By: Merilyn Baba M.D.   On: 12/14/2021 00:48   CT Cervical Spine Wo Contrast  Result Date: 12/14/2021 CLINICAL DATA:  Fall EXAM: CT HEAD WITHOUT CONTRAST CT CERVICAL SPINE WITHOUT CONTRAST TECHNIQUE: Multidetector CT imaging of the head and cervical spine was performed following the standard protocol without intravenous contrast. Multiplanar CT image reconstructions of the cervical spine were also generated. RADIATION DOSE REDUCTION: This exam was performed according to the departmental dose-optimization program which includes  automated exposure control, adjustment of the mA and/or kV according to patient size and/or use of iterative reconstruction technique. COMPARISON:  08/31/2020 CT head and cervical spine. FINDINGS: CT HEAD FINDINGS Brain: No evidence of acute infarct, hemorrhage, mass, mass effect, or midline shift. No hydrocephalus or extra-axial fluid collection. Periventricular white matter changes, likely the sequela of chronic small vessel ischemic disease. Age related cerebral atrophy. Vascular: No hyperdense vessel. Skull: Normal. Negative for fracture or  focal lesion. Sinuses/Orbits: Mucous retention cyst in the right maxillary sinus. Status post bilateral lens replacements. Other: The mastoid air cells are well aerated. CT CERVICAL SPINE FINDINGS Alignment: Unchanged reversal of the normal cervical lordosis with trace anterolisthesis of C2 on C3, C3 on C4, and C4 on C5. No traumatic listhesis. Skull base and vertebrae: No acute fracture. No primary bone lesion or focal pathologic process. Soft tissues and spinal canal: No prevertebral fluid or swelling. No visible canal hematoma. Disc levels: Multilevel degenerative changes, most prominent C5-C6 and C6-C7. Multilevel facet and uncovertebral hypertrophy. Upper chest: No focal pulmonary opacity or pleural effusion. Other: None. IMPRESSION: 1. No acute intracranial process. 2. No acute fracture or traumatic listhesis in the cervical spine. Electronically Signed   By: Merilyn Baba M.D.   On: 12/14/2021 00:48   DG Chest Port 1 View  Result Date: 12/14/2021 CLINICAL DATA:  Nausea with syncopal episode, initial encounter EXAM: PORTABLE CHEST 1 VIEW COMPARISON:  None Available. FINDINGS: Cardiac shadow is mildly prominent. Tortuous thoracic aorta is seen. Old healed left rib fractures are noted. Lungs are clear. Elevation of the right hemidiaphragm is noted. No acute bony abnormality is seen. IMPRESSION: No acute abnormality noted. Electronically Signed   By: Inez Catalina M.D.    On: 12/14/2021 00:34     PROCEDURES:  Critical Care performed: No  .1-3 Lead EKG Interpretation  Performed by: Paulette Blanch, MD Authorized by: Paulette Blanch, MD     Interpretation: normal     ECG rate:  60   ECG rate assessment: normal     Rhythm: sinus rhythm     Ectopy: none     Conduction: normal   Comments:     Patient placed on cardiac monitor to evaluate for arrhythmias    MEDICATIONS ORDERED IN ED: Medications  cefTRIAXone (ROCEPHIN) 1 g in sodium chloride 0.9 % 100 mL IVPB (1 g Intravenous New Bag/Given 12/14/21 0228)  acetaminophen (TYLENOL) tablet 650 mg (650 mg Oral Given 12/14/21 0152)     IMPRESSION / MDM / ASSESSMENT AND PLAN / ED COURSE  I reviewed the triage vital signs and the nursing notes.                             88 year old female presenting with nausea and fall.  Differential diagnosis includes but is not limited to ACS, ACS, metabolic, infectious etiologies, etc.  I have personally reviewed patient's records and note a PCP office visit on 11/24/2021 for contact dermatitis.  Patient's presentation is most consistent with acute presentation with potential threat to life or bodily function.  The patient is on the cardiac monitor to evaluate for evidence of arrhythmia and/or significant heart rate changes.  We will obtain basic lab work, UA, CT head/cervical spine.  Nursing to cleanse head wound for reexamination after patient is cleared after CT.  Clinical Course as of 12/14/21 0250  Nancy Fetter Dec 14, 2021  0112 Updated patient and her sons on laboratory and imaging results.  Patient now complaining of thoracic and lumbar back pain.  There are no step-offs or deformities noted.  Will obtain plain film imaging of thoracic and lumbar spine and administer Tylenol.  Awaiting urine.  Head cleaned to find small nonbleeding abrasion not requiring sutures or staples. [JS]  [phone] Updated patient and sons on UA remarkable for large leukocytes with greater than 50 WBC,  thoracic and lumbar x-rays negative for acute osseous injury.  Reexamined along T4 and T10 which are not point tender to palpation.  Patient receiving IV Rocephin and will be discharged home on Keflex.  Strict return precautions given.  All verbalized understanding agree with plan of care. [JS]    Clinical Course User Index [JS] Paulette Blanch, MD     FINAL CLINICAL IMPRESSION(S) / ED DIAGNOSES   Final diagnoses:  Fall, initial encounter  Laceration of scalp, initial encounter  Nausea without vomiting  Lower urinary tract infectious disease     Rx / DC Orders   ED Discharge Orders     None        Note:  This document was prepared using Dragon voice recognition software and may include unintentional dictation errors.   Paulette Blanch, MD 12/14/21 [phone]

## 2021-12-14 NOTE — ED Triage Notes (Signed)
Per ems pt was in kitchen making food when she felt nauseated, began to feel weak in knees and fell. Pt states she was nauseated "and the next thing I knew I woke up on the floor". Pt with laceration to posterior scalp.

## 2021-12-14 NOTE — Discharge Instructions (Addendum)
1.  Take antibiotic as prescribed (Keflex 250 mg 3 times daily x7 days). 2.  You may take Tylenol as needed for pain. 3.  Drink plenty of fluids daily. 4.  Return to the ER for worsening symptoms, persistent vomiting, difficulty breathing or other concerns.

## 2021-12-15 LAB — URINE CULTURE: Culture: 10000 — AB

## 2021-12-16 ENCOUNTER — Emergency Department: Payer: Medicare HMO

## 2021-12-16 ENCOUNTER — Other Ambulatory Visit: Payer: Self-pay

## 2021-12-16 ENCOUNTER — Inpatient Hospital Stay
Admission: EM | Admit: 2021-12-16 | Discharge: 2021-12-22 | DRG: 640 | Disposition: A | Discharge status: Home / Self Care | Payer: Medicare HMO | Attending: Osteopathic Medicine | Admitting: Osteopathic Medicine

## 2021-12-16 ENCOUNTER — Encounter: Payer: Self-pay | Admitting: Emergency Medicine

## 2021-12-16 DIAGNOSIS — D649 Anemia, unspecified: Secondary | ICD-10-CM | POA: Diagnosis present

## 2021-12-16 DIAGNOSIS — W19XXXA Unspecified fall, initial encounter: Secondary | ICD-10-CM | POA: Diagnosis present

## 2021-12-16 DIAGNOSIS — K5900 Constipation, unspecified: Secondary | ICD-10-CM | POA: Diagnosis present

## 2021-12-16 DIAGNOSIS — N390 Urinary tract infection, site not specified: Secondary | ICD-10-CM | POA: Diagnosis present

## 2021-12-16 DIAGNOSIS — L4050 Arthropathic psoriasis, unspecified: Secondary | ICD-10-CM | POA: Diagnosis present

## 2021-12-16 DIAGNOSIS — I272 Pulmonary hypertension, unspecified: Secondary | ICD-10-CM | POA: Diagnosis present

## 2021-12-16 DIAGNOSIS — Z7982 Long term (current) use of aspirin: Secondary | ICD-10-CM

## 2021-12-16 DIAGNOSIS — E871 Hypo-osmolality and hyponatremia: Secondary | ICD-10-CM | POA: Diagnosis present

## 2021-12-16 DIAGNOSIS — Z888 Allergy status to other drugs, medicaments and biological substances status: Secondary | ICD-10-CM | POA: Diagnosis not present

## 2021-12-16 DIAGNOSIS — N816 Rectocele: Secondary | ICD-10-CM | POA: Diagnosis present

## 2021-12-16 DIAGNOSIS — I10 Essential (primary) hypertension: Secondary | ICD-10-CM | POA: Diagnosis present

## 2021-12-16 DIAGNOSIS — Z66 Do not resuscitate: Secondary | ICD-10-CM | POA: Diagnosis present

## 2021-12-16 DIAGNOSIS — S0101XA Laceration without foreign body of scalp, initial encounter: Secondary | ICD-10-CM | POA: Diagnosis present

## 2021-12-16 DIAGNOSIS — R0981 Nasal congestion: Secondary | ICD-10-CM | POA: Diagnosis present

## 2021-12-16 DIAGNOSIS — S22070A Wedge compression fracture of T9-T10 vertebra, initial encounter for closed fracture: Secondary | ICD-10-CM

## 2021-12-16 DIAGNOSIS — R001 Bradycardia, unspecified: Secondary | ICD-10-CM | POA: Diagnosis not present

## 2021-12-16 DIAGNOSIS — Z9181 History of falling: Secondary | ICD-10-CM

## 2021-12-16 DIAGNOSIS — Z86010 Personal history of colonic polyps: Secondary | ICD-10-CM | POA: Diagnosis not present

## 2021-12-16 DIAGNOSIS — E860 Dehydration: Secondary | ICD-10-CM | POA: Diagnosis present

## 2021-12-16 DIAGNOSIS — I700 Atherosclerosis of aorta: Secondary | ICD-10-CM | POA: Diagnosis present

## 2021-12-16 DIAGNOSIS — T502X5A Adverse effect of carbonic-anhydrase inhibitors, benzothiadiazides and other diuretics, initial encounter: Secondary | ICD-10-CM | POA: Diagnosis present

## 2021-12-16 DIAGNOSIS — G9341 Metabolic encephalopathy: Secondary | ICD-10-CM | POA: Diagnosis present

## 2021-12-16 DIAGNOSIS — R110 Nausea: Secondary | ICD-10-CM | POA: Diagnosis present

## 2021-12-16 DIAGNOSIS — R17 Unspecified jaundice: Secondary | ICD-10-CM

## 2021-12-16 DIAGNOSIS — I160 Hypertensive urgency: Secondary | ICD-10-CM | POA: Diagnosis present

## 2021-12-16 DIAGNOSIS — Z79899 Other long term (current) drug therapy: Secondary | ICD-10-CM

## 2021-12-16 DIAGNOSIS — M810 Age-related osteoporosis without current pathological fracture: Secondary | ICD-10-CM | POA: Diagnosis present

## 2021-12-16 DIAGNOSIS — Z881 Allergy status to other antibiotic agents status: Secondary | ICD-10-CM

## 2021-12-16 DIAGNOSIS — K8020 Calculus of gallbladder without cholecystitis without obstruction: Secondary | ICD-10-CM

## 2021-12-16 DIAGNOSIS — E861 Hypovolemia: Secondary | ICD-10-CM | POA: Diagnosis present

## 2021-12-16 LAB — CBC WITH DIFFERENTIAL/PLATELET
Abs Immature Granulocytes: 0.18 10*3/uL — ABNORMAL HIGH (ref 0.00–0.07)
Basophils Absolute: 0 10*3/uL (ref 0.0–0.1)
Basophils Relative: 0 %
Eosinophils Absolute: 0.1 10*3/uL (ref 0.0–0.5)
Eosinophils Relative: 0 %
HCT: 34.8 % — ABNORMAL LOW (ref 36.0–46.0)
Hemoglobin: 12.2 g/dL (ref 12.0–15.0)
Immature Granulocytes: 1 %
Lymphocytes Relative: 6 %
Lymphs Abs: 0.8 10*3/uL (ref 0.7–4.0)
MCH: 32.7 pg (ref 26.0–34.0)
MCHC: 35.1 g/dL (ref 30.0–36.0)
MCV: 93.3 fL (ref 80.0–100.0)
Monocytes Absolute: 1.4 10*3/uL — ABNORMAL HIGH (ref 0.1–1.0)
Monocytes Relative: 10 %
Neutro Abs: 11.1 10*3/uL — ABNORMAL HIGH (ref 1.7–7.7)
Neutrophils Relative %: 83 %
Platelets: 196 10*3/uL (ref 150–400)
RBC: 3.73 MIL/uL — ABNORMAL LOW (ref 3.87–5.11)
RDW: 13.2 % (ref 11.5–15.5)
WBC: 13.5 10*3/uL — ABNORMAL HIGH (ref 4.0–10.5)
nRBC: 0 % (ref 0.0–0.2)

## 2021-12-16 LAB — LACTIC ACID, PLASMA
Lactic Acid, Venous: 1.3 mmol/L (ref 0.5–1.9)
Lactic Acid, Venous: 1.1 mmol/L (ref 0.5–1.9)

## 2021-12-16 LAB — COMPREHENSIVE METABOLIC PANEL
ALT: 27 U/L (ref 0–44)
AST: 27 U/L (ref 15–41)
Albumin: 3.5 g/dL (ref 3.5–5.0)
Alkaline Phosphatase: 53 U/L (ref 38–126)
Anion gap: 11 (ref 5–15)
BUN: 17 mg/dL (ref 8–23)
CO2: 24 mmol/L (ref 22–32)
Calcium: 10.1 mg/dL (ref 8.9–10.3)
Chloride: 85 mmol/L — ABNORMAL LOW (ref 98–111)
Creatinine, Ser: 0.68 mg/dL (ref 0.44–1.00)
GFR, Estimated: >60 mL/min (ref >60)
Glucose, Bld: 106 mg/dL — ABNORMAL HIGH (ref 70–99)
Potassium: 4.3 mmol/L (ref 3.5–5.1)
Sodium: 120 mmol/L — ABNORMAL LOW (ref 135–145)
Total Bilirubin: 1.3 mg/dL — ABNORMAL HIGH (ref 0.3–1.2)
Total Protein: 6 g/dL — ABNORMAL LOW (ref 6.5–8.1)

## 2021-12-16 LAB — OSMOLALITY, URINE: Osmolality, Ur: 215 mOsm/kg — ABNORMAL LOW (ref 300–900)

## 2021-12-16 LAB — OSMOLALITY: Osmolality: 252 mOsm/kg — ABNORMAL LOW (ref 275–295)

## 2021-12-16 LAB — SODIUM, URINE, RANDOM: Sodium, Ur: 12 mmol/L

## 2021-12-16 MED ORDER — IOHEXOL 300 MG/ML  SOLN
75.0000 mL | Freq: Once | INTRAMUSCULAR | Status: AC | PRN
  Administered 2021-12-16: 75 mL via INTRAVENOUS

## 2021-12-16 MED ORDER — SODIUM CHLORIDE 0.9 % IV BOLUS
500.0000 mL | Freq: Once | INTRAVENOUS | Status: AC
  Administered 2021-12-16: 500 mL via INTRAVENOUS

## 2021-12-16 NOTE — Assessment & Plan Note (Signed)
Etiology of fall uncertain.  Patient has improving occipitotemporal scalp hematoma without intracranial injury Fall precautions PT eval Neurologic checks

## 2021-12-16 NOTE — ED Notes (Signed)
Patient transported to CT 

## 2021-12-16 NOTE — Assessment & Plan Note (Signed)
Acuity uncertain CT chest thoracic spine showed "Lower plate transverse oblique compression fracture of the T10 vertebral body with up to 15% loss of the overall vertebral body height, with cortical buckling anteriorly and posteriorly and trace retropulsion Continue TLSO brace ordered from the ED Pain control Consider neurosurgery consult/IR for kyphoplasty

## 2021-12-16 NOTE — ED Provider Notes (Signed)
[facility] Provider Note    Event Date/Time   First MD Initiated Contact with Patient 12/16/21 1941     (approximate)   History   Abnormal Lab   HPI  Lisa Koch is a 88 y.o. female here with abnormal lab.  The patient had a fall several days ago and was seen in the ED.  Since then, she has had increasing thoracic back pain.  She was treated for UTI at that time.  She has had nausea and vomiting since then as well.  She is also had increased confusion.  She went to her PCP today, who performed lab work and noted severe hyponatremia for which she was sent to the ED.  Patient has been more confused.  She has had intermittent slurred speech.  She has complained of thoracic and lower back pain.  She is also complaining of abdominal distention, which has been ongoing for the last several days and progressively worsening.     Physical Exam   Triage Vital Signs: ED Triage Vitals  Enc Vitals Group     BP      Pulse      Resp      Temp      Temp src      SpO2      Weight      Height      Head Circumference      Peak Flow      Pain Score      Pain Loc      Pain Edu?      Excl. in [place]?     Most recent vital signs: Vitals:   12/16/21 2200 12/16/21 2230  BP: (!) 150/66 138/61  Pulse: 68 64  Resp: 18 (!) 21  Temp:  98.2 F (36.8 C)  SpO2: 96% 95%     General: Awake, no distress.  CV:  Good peripheral perfusion.  Regular rate and rhythm. Resp:  Normal effort.  Lungs clear. Abd:  Moderate distention, no rebound or guarding.  No peritonitis. Other:  Trace edema bilateral lower extremities.   ED Results / Procedures / Treatments   Labs (all labs ordered are listed, but only abnormal results are displayed) Labs Reviewed  CBC WITH DIFFERENTIAL/PLATELET - Abnormal; Notable for the following components:      Result Value   WBC 13.5 (*)    RBC 3.73 (*)    HCT 34.8 (*)    Neutro Abs 11.1 (*)    Monocytes Absolute 1.4 (*)    Abs Immature  Granulocytes 0.18 (*)    All other components within normal limits  COMPREHENSIVE METABOLIC PANEL - Abnormal; Notable for the following components:   Sodium 120 (*)    Chloride 85 (*)    Glucose, Bld 106 (*)    Total Protein 6.0 (*)    Total Bilirubin 1.3 (*)    All other components within normal limits  OSMOLALITY - Abnormal; Notable for the following components:   Osmolality 252 (*)    All other components within normal limits  OSMOLALITY, URINE - Abnormal; Notable for the following components:   Osmolality, Ur 215 (*)    All other components within normal limits  CULTURE, BLOOD (SINGLE)  SODIUM, URINE, RANDOM  LACTIC ACID, PLASMA  LACTIC ACID, PLASMA  URINALYSIS, ROUTINE W REFLEX MICROSCOPIC     EKG Normal sinus rhythm, ventricular rate 60.  PR 185, QRS 1 2, QTc 411.  No acute ST elevations or depressions.  No EKG evidence of acute ischemia or infarct.   RADIOLOGY CT head/C-spine: No acute abnormality, no new cervical injuries CT thoracic spine: Oblique compression fracture of the T10 vertebral body with up to 50% height loss CT evidence of pelvis: No acute findings   I also independently reviewed and agree with radiologist interpretations.   PROCEDURES:  Critical Care performed: Yes, see critical care procedure note(s)   MEDICATIONS ORDERED IN ED: Medications  sodium chloride 0.9 % bolus 500 mL (0 mLs Intravenous Stopped 12/16/21 2118)  iohexol (OMNIPAQUE) 300 MG/ML solution 75 mL (75 mLs Intravenous Contrast Given 12/16/21 2037)     IMPRESSION / MDM / ASSESSMENT AND PLAN / ED COURSE  I reviewed the triage vital signs and the nursing notes.                               The patient is on the cardiac monitor to evaluate for evidence of arrhythmia and/or significant heart rate changes.   Ddx:  Differential includes the following, with pertinent life- or limb-threatening emergencies considered:  Acute hyponatremia in the setting of fall, pain, HCTZ use,  vomiting, possible renal failure, possible CHF, possible intra-abdominal pathology such as ovarian cancer given her abdominal distention  Patient's presentation is most consistent with acute presentation with potential threat to life or bodily function.  MDM:  88 year old female here with acute hyponatremia and ongoing back pain.  Suspect multifactorial hypovolemic hyponatremia in the setting of poor p.o. intake, medications, severe pain, and possibly medications.  She does appear dehydrated.  Sodium was just over 132 days ago.  We will give fluids and plan to monitor closely.  Additional labs sent.  CBC shows mild leukocytosis which is suspect is related to her recent fall as well as possible UTI.  She does not appear septic.  Urine culture was negative.  CT imaging repeated given her ongoing pain, does show an acute T10 compression fracture.  Otherwise, no acute abnormality is noted.  Discussed this with Dr. Cari Caraway, who recommends TLSO for pain control.  Will admit to medicine for management of acute hyponatremia and pain control with T10 compression fracture which will be placed in a TLSO.   MEDICATIONS GIVEN IN ED: Medications  sodium chloride 0.9 % bolus 500 mL (0 mLs Intravenous Stopped 12/16/21 2118)  iohexol (OMNIPAQUE) 300 MG/ML solution 75 mL (75 mLs Intravenous Contrast Given 12/16/21 2037)     Consults:  Hospitalist Discussed with Dr. Izora Ribas, [place]   EMR reviewed  Prior ED records from 10/1 visit     FINAL CLINICAL IMPRESSION(S) / ED DIAGNOSES   Final diagnoses:  Acute hyponatremia  Closed wedge compression fracture of T10 vertebra, initial encounter ([place])     Rx / DC Orders   ED Discharge Orders     None        Note:  This document was prepared using Dragon voice recognition software and may include unintentional dictation errors.   Duffy Bruce, MD 12/16/21 2328

## 2021-12-16 NOTE — H&P (Signed)
History and Physical    Patient: Lisa Koch ASN:[id number] DOB: [birth year] DOA: 12/16/2021 DOS: the patient was seen and examined on 12/16/2021 PCP: Tracie Harrier, MD  Patient coming from: Home  Chief Complaint:  Chief Complaint  Patient presents with   Abnormal Lab    HPI: Lisa Koch is a 88 y.o. female with medical history significant for ST-T wave personally viewed and interpreted showing sinus at 60 with no acute changes. Trauma imaging including CT head, C-spine and abdomen and pelvis with mostly nonacute findings.  Notable findings included a T10 vertebral body compression fracture with 15% height loss, 1.4 cm gallstone impacted in the gallbladder neck without gallbladder wall thickening.  Detailed results below: IMPRESSION: 1. No acute intracranial CT findings or depressed skull fractures. Calvarial osteopenia. 2. Small right occipitotemporal scalp hematoma, smaller than 2 days ago. 3. Osteopenia, degenerative changes, reversed lordosis in the cervical spine without evidence of fractures or traumatic listhesis. 4. Lower plate transverse oblique compression fracture of the T10 vertebral body with up to 15% loss of the overall vertebral body height, with cortical buckling anteriorly and posteriorly and trace retropulsion. No spinal canal hematoma is seen. 5. 8 mm irregular nodule in the right middle lobe. The usual recommendation per Fleischner guidelines would be for 6-12 month follow-up CT to ensure stability, with additional CT depending on whether or not the patient is high risk. In this case, follow-up imaging should be considered in light of the patient's advanced age and likelihood of intervention if the nodule shows growth. 6. 5 cm simple cyst right ovary. Three-month follow-up ultrasound for reassessment/recharacterization is suggested, taking into account advanced age. 7. No other acute CT findings. Chronic changes including constipation and  diverticulosis. 8. Renal cysts and scarring with heavy calcification in the renal artery ostia. 9. 1.4 cm gallstone appears impacted in the gallbladder neck but there is no gallbladder wall thickening. 10. Aortic atherosclerosis. 11. Small rectocele protruding below the level of the pelvic floor. Additional evidence of pelvic floor laxity with low pelvic ureteral insertions but no cystocele.  Patient given an sodium chloride bolus.  A TLSO brace was applied.  Hospitalist consulted for admission.  Marland Kitchen   History reviewed. No pertinent past medical history. Past Surgical History:  Procedure Laterality Date   BREAST BIOPSY Left 2012   core - neg   BREAST BIOPSY Left 2013   excisional - neg   BREAST EXCISIONAL BIOPSY     Social History:  reports that she has never smoked. She has never used smokeless tobacco. She reports that she does not drink alcohol and does not use drugs.  No Known Allergies  Family History  Problem Relation Age of Onset   Breast cancer Neg Hx     Prior to Admission medications   Medication Sig Start Date End Date Taking? Authorizing Provider  aspirin 81 MG EC tablet Take 81 mg by mouth daily.    [provider]  cephALEXin (KEFLEX) 250 MG capsule Take 1 capsule (250 mg total) by mouth 3 (three) times daily. 12/14/21   Paulette Blanch, MD  folic acid (FOLVITE) 1 MG tablet Take 2 mg by mouth daily.    [provider]  hydrochlorothiazide (HYDRODIURIL) 12.5 MG tablet Take 12.5 mg by mouth every other day. 06/29/20   [provider]  HYDROcodone-acetaminophen (NORCO/VICODIN) 5-325 MG tablet Take 1 tablet by mouth every 4 (four) hours as needed. 09/01/20   Ward, Delice Bison, DO  lisinopril (ZESTRIL) 40 MG tablet Take  40 mg by mouth daily. 06/19/20   [provider]  methotrexate (RHEUMATREX) 2.5 MG tablet Take 10 mg by mouth once a week. 08/05/20   [provider]  omeprazole (PRILOSEC) 10 MG capsule Take 10 mg by mouth daily.  07/03/20   [provider]  ondansetron (ZOFRAN ODT) 4 MG disintegrating tablet Take 1 tablet (4 mg total) by mouth every 6 (six) hours as needed for nausea or vomiting. 09/01/20   Ward, Delice Bison, DO  Potassium Chloride ER 20 MEQ TBCR Take 20 mEq by mouth daily. 06/20/20   [provider]  verapamil (CALAN-SR) 240 MG CR tablet Take 240 mg by mouth daily. 06/17/20   [provider]    Physical Exam: Vitals:   12/16/21 2000 12/16/21 2030 12/16/21 2100 12/16/21 2130  BP: (!) 159/91 (!) 169/59 (!) 164/53 (!) 150/65  Pulse: 62 62 62 64  Resp: 17 (!) 27 (!) 21 18  Temp:      TempSrc:      SpO2: 96% 96% 96% 93%  Weight:      Height:       Physical Exam  Labs on Admission: I have personally reviewed following labs and imaging studies  CBC: Recent Labs  Lab 12/14/21 0022 12/16/21 1953  WBC 13.9* 13.5*  NEUTROABS 11.1* 11.1*  HGB 12.7 12.2  HCT 37.7 34.8*  MCV 96.9 93.3  PLT 200 096   Basic Metabolic Panel: Recent Labs  Lab 12/14/21 0022 12/16/21 1953  NA 132* 120*  K 4.2 4.3  CL 100 85*  CO2 24 24  GLUCOSE 237* 106*  BUN 28* 17  CREATININE 1.09* 0.68  CALCIUM 9.3 10.1   GFR: Estimated Creatinine Clearance: 34.1 mL/min (by C-G formula based on SCr of 0.68 mg/dL). Liver Function Tests: Recent Labs  Lab 12/14/21 0022 12/16/21 1953  AST 28 27  ALT 25 27  ALKPHOS 68 53  BILITOT 0.6 1.3*  PROT 6.2* 6.0*  ALBUMIN 3.8 3.5   Recent Labs  Lab 12/14/21 0022  LIPASE 47   No results for input(s): "AMMONIA" in the last 168 hours. Coagulation Profile: No results for input(s): "INR", "PROTIME" in the last 168 hours. Cardiac Enzymes: No results for input(s): "CKTOTAL", "CKMB", "CKMBINDEX", "TROPONINI" in the last 168 hours. BNP (last 3 results) No results for input(s): "PROBNP" in the last 8760 hours. HbA1C: No results for input(s): "HGBA1C" in the last 72 hours. CBG: No results for input(s): "GLUCAP" in the last 168 hours. Lipid Profile: No  results for input(s): "CHOL", "HDL", "LDLCALC", "TRIG", "CHOLHDL", "LDLDIRECT" in the last 72 hours. Thyroid Function Tests: No results for input(s): "TSH", "T4TOTAL", "FREET4", "T3FREE", "THYROIDAB" in the last 72 hours. Anemia Panel: No results for input(s): "VITAMINB12", "FOLATE", "FERRITIN", "TIBC", "IRON", "RETICCTPCT" in the last 72 hours. Urine analysis:    Component Value Date/Time   COLORURINE YELLOW (A) 12/14/2021 0140   APPEARANCEUR HAZY (A) 12/14/2021 0140   LABSPEC 1.018 12/14/2021 0140   PHURINE 5.0 12/14/2021 0140   GLUCOSEU NEGATIVE 12/14/2021 0140   HGBUR NEGATIVE 12/14/2021 0140   BILIRUBINUR NEGATIVE 12/14/2021 0140   KETONESUR NEGATIVE 12/14/2021 0140   PROTEINUR NEGATIVE 12/14/2021 0140   NITRITE NEGATIVE 12/14/2021 0140   LEUKOCYTESUR LARGE (A) 12/14/2021 0140    Radiological Exams on Admission: CT HEAD WO CONTRAST (5MM)  Result Date: 12/16/2021 CLINICAL DATA:  [place] 3 days ago, with hyponatremia and confusion. Neck pain. EXAM: CT HEAD WITHOUT CONTRAST CT CERVICAL SPINE WITHOUT CONTRAST CT ABDOMEN AND PELVIS WITH CONTRAST  TECHNIQUE: Contiguous axial images were obtained from the base of the skull through the vertex without intravenous contrast. Multidetector CT imaging of the cervical spine was performed without intravenous contrast. Multiplanar CT image reconstructions were also generated. Multidetector CT imaging of the abdomen and pelvis was performed using the standard protocol following bolus administration of intravenous contrast. RADIATION DOSE REDUCTION: This exam was performed according to the departmental dose-optimization program which includes automated exposure control, adjustment of the mA and/or kV according to patient size and/or use of iterative reconstruction technique. CONTRAST:  63m OMNIPAQUE IOHEXOL 300 MG/ML  SOLN COMPARISON:  Head CT and cervical spine CT both 12/14/2021. No prior abdomen pelvis CT. FINDINGS: CT HEAD FINDINGS Brain: There is  moderately advanced cerebral atrophy with atrophic ventriculomegaly and mild cerebellar atrophy. There is moderate to severe small vessel disease of the cerebral white matter. No asymmetry is seen concerning for an acute infarct, hemorrhage or mass. There is no midline shift. Basal cisterns are clear. Vascular: There are calcifications in the carotid siphons. No hyperdense central vessel is seen. Skull: Negative for fractures or focal lesions. 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 There is a small right occipitotemporal scalp hematoma which was larger previously. No new scalp hematoma is seen. Sinuses/Orbits: Old lens replacements. Visualized sinuses, bilateral mastoid air cells are clear. Other: None. CT CERVICAL SPINE FINDINGS Alignment: Stable findings. Reversed lordosis again noted centered at C5 and 2 mm grade 1 anterolisthesis C2-3, C3-4 and C4-5, trace retrolisthesis C5-6 and C6-7 all likely degenerative etiology. There is no widening of the anterior atlantodental joint. There is spurring along the C1-2 lateral mass articulations. Spurring of the anterior atlantodental joint. Skull base and vertebrae: Osteopenia. No fracture or pathologic bone lesion is seen. Soft tissues and spinal canal: No prevertebral fluid or swelling. No visible canal hematoma. A small left dorsal epidural calcified meningioma is again noted at C4-5, but there is no associated mass effect. Disc levels: There is advanced degenerative disc collapse with bidirectional osteophytes at C5-6 and C6-7, posterior disc osteophyte complexes flattening of the ventral cord surface at these levels. Other levels show lesser degenerative disc space loss minimal endplate spurring without mass effect. There is multilevel facet joint and uncinate hypertrophy with facet joint ankylosis on the left at C3-4. Degenerative foraminal stenosis is greatest on the left at C2-3 and C3-4, and on both sides at C4-5, and on the left again at C5-6 and C6-7. Upper chest:  Negative. Other: None. CT ABDOMEN AND PELVIS FINDINGS Lower chest: There are atelectatic bands in the lower lobes and in the elevated right hemidiaphragm. There is an irregular 8 mm noncalcified nodule medially in the right middle lobe on series 4 axial image 2. Remaining lung bases are clear. There are scattered calcifications in the aortic valve leaflets and coronary arteries, mild cardiomegaly. Hepatobiliary: Breathing motion limits evaluation. There is no obvious mass. There is a 1.4 cm stone in the gallbladder neck, which may be impacted but there is no gallbladder thickening or biliary dilatation. Pancreas: No focal abnormality is seen through the breathing motion. Spleen: No focal abnormality is seen through the breathing motion. No splenomegaly. Adrenals/Urinary Tract: There is no adrenal mass. There is a wedge-shaped cortical scar laterally in the upper pole left kidney. There is a homogeneous 2.1 cm thin walled cyst in the upper pole of the right kidney or 14.1 Hounsfield units, 9 mm low-density lesion medial to this in the right upper pole which is too small to characterize. No imaging follow-up is recommended. Remainder  of both kidneys are unremarkable. There is no urinary stone or obstruction. There is no bladder thickening. The ureters insert low in the pelvis indicating pelvic floor laxity but there is no cystocele. Stomach/Bowel: The stomach is contracted. The small bowel is normal in caliber. An appendix is not seen in this patient. There is moderate stool retention particularly in the cecum and ascending colon, proximal transverse segment with left-sided diverticulosis without evidence of diverticulitis. Diverticulosis most advanced in the sigmoid segment. There is a small rectocele protruding just below the level of the pelvic floor. Vascular/Lymphatic: Aortic atherosclerosis. No enlarged abdominal or pelvic lymph nodes. Branch artery calcifications are noted, greatest in the renal artery ostia.  Reproductive: Surgically absent uterus. The left ovary is unremarkable. The right ovary demonstrates a 5 cm thin walled homogeneous simple appearing cyst of 13.2 Hounsfield units. No adnexal solid mass is seen. Other: There is no free air, free hemorrhage or free fluid. There are small umbilical fat hernias. There is no incarcerated abdominal wall hernia. Multiple pelvic phleboliths. Musculoskeletal: There is osteopenia. There is a compression fracture of the T10 vertebral body, with transverse oblique linear fracture line through the inferior vertebral body, buckling along the anterior and posterior cortex, trace retropulsion of the posteroinferior cortex and approximately 15% loss of the overall vertebral body height. There is no extension into the pedicles and posterior elements. There is no associated herniated disc. There are degenerative changes of the lumbar spine greatest at L4-5 and L5-S1. Mild hip DJD and chondrocalcinosis. IMPRESSION: 1. No acute intracranial CT findings or depressed skull fractures. Calvarial osteopenia. 2. Small right occipitotemporal scalp hematoma, smaller than 2 days ago. 3. Osteopenia, degenerative changes, reversed lordosis in the cervical spine without evidence of fractures or traumatic listhesis. 4. Lower plate transverse oblique compression fracture of the T10 vertebral body with up to 15% loss of the overall vertebral body height, with cortical buckling anteriorly and posteriorly and trace retropulsion. No spinal canal hematoma is seen. 5. 8 mm irregular nodule in the right middle lobe. The usual recommendation per Fleischner guidelines would be for 6-12 month follow-up CT to ensure stability, with additional CT depending on whether or not the patient is high risk. In this case, follow-up imaging should be considered in light of the patient's advanced age and likelihood of intervention if the nodule shows growth. 6. 5 cm simple cyst right ovary. Three-month follow-up ultrasound  for reassessment/recharacterization is suggested, taking into account advanced age. 7. No other acute CT findings. Chronic changes including constipation and diverticulosis. 8. Renal cysts and scarring with heavy calcification in the renal artery ostia. 9. 1.4 cm gallstone appears impacted in the gallbladder neck but there is no gallbladder wall thickening. 10. Aortic atherosclerosis. 11. Small rectocele protruding below the level of the pelvic floor. Additional evidence of pelvic floor laxity with low pelvic ureteral insertions but no cystocele. Electronically Signed   By: Telford Nab M.D.   On: 12/16/2021 21:36   CT Cervical Spine Wo Contrast  Result Date: 12/16/2021 CLINICAL DATA:  [place] 3 days ago, with hyponatremia and confusion. Neck pain. EXAM: CT HEAD WITHOUT CONTRAST CT CERVICAL SPINE WITHOUT CONTRAST CT ABDOMEN AND PELVIS WITH CONTRAST TECHNIQUE: Contiguous axial images were obtained from the base of the skull through the vertex without intravenous contrast. Multidetector CT imaging of the cervical spine was performed without intravenous contrast. Multiplanar CT image reconstructions were also generated. Multidetector CT imaging of the abdomen and pelvis was performed using the standard protocol following bolus administration of  intravenous contrast. RADIATION DOSE REDUCTION: This exam was performed according to the departmental dose-optimization program which includes automated exposure control, adjustment of the mA and/or kV according to patient size and/or use of iterative reconstruction technique. CONTRAST:  40m OMNIPAQUE IOHEXOL 300 MG/ML  SOLN COMPARISON:  Head CT and cervical spine CT both 12/14/2021. No prior abdomen pelvis CT. FINDINGS: CT HEAD FINDINGS Brain: There is moderately advanced cerebral atrophy with atrophic ventriculomegaly and mild cerebellar atrophy. There is moderate to severe small vessel disease of the cerebral white matter. No asymmetry is seen concerning for an acute  infarct, hemorrhage or mass. There is no midline shift. Basal cisterns are clear. Vascular: There are calcifications in the carotid siphons. No hyperdense central vessel is seen. Skull: Negative for fractures or focal lesions. 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 There is a small right occipitotemporal scalp hematoma which was larger previously. No new scalp hematoma is seen. Sinuses/Orbits: Old lens replacements. Visualized sinuses, bilateral mastoid air cells are clear. Other: None. CT CERVICAL SPINE FINDINGS Alignment: Stable findings. Reversed lordosis again noted centered at C5 and 2 mm grade 1 anterolisthesis C2-3, C3-4 and C4-5, trace retrolisthesis C5-6 and C6-7 all likely degenerative etiology. There is no widening of the anterior atlantodental joint. There is spurring along the C1-2 lateral mass articulations. Spurring of the anterior atlantodental joint. Skull base and vertebrae: Osteopenia. No fracture or pathologic bone lesion is seen. Soft tissues and spinal canal: No prevertebral fluid or swelling. No visible canal hematoma. A small left dorsal epidural calcified meningioma is again noted at C4-5, but there is no associated mass effect. Disc levels: There is advanced degenerative disc collapse with bidirectional osteophytes at C5-6 and C6-7, posterior disc osteophyte complexes flattening of the ventral cord surface at these levels. Other levels show lesser degenerative disc space loss minimal endplate spurring without mass effect. There is multilevel facet joint and uncinate hypertrophy with facet joint ankylosis on the left at C3-4. Degenerative foraminal stenosis is greatest on the left at C2-3 and C3-4, and on both sides at C4-5, and on the left again at C5-6 and C6-7. Upper chest: Negative. Other: None. CT ABDOMEN AND PELVIS FINDINGS Lower chest: There are atelectatic bands in the lower lobes and in the elevated right hemidiaphragm. There is an irregular 8 mm noncalcified nodule medially in the right  middle lobe on series 4 axial image 2. Remaining lung bases are clear. There are scattered calcifications in the aortic valve leaflets and coronary arteries, mild cardiomegaly. Hepatobiliary: Breathing motion limits evaluation. There is no obvious mass. There is a 1.4 cm stone in the gallbladder neck, which may be impacted but there is no gallbladder thickening or biliary dilatation. Pancreas: No focal abnormality is seen through the breathing motion. Spleen: No focal abnormality is seen through the breathing motion. No splenomegaly. Adrenals/Urinary Tract: There is no adrenal mass. There is a wedge-shaped cortical scar laterally in the upper pole left kidney. There is a homogeneous 2.1 cm thin walled cyst in the upper pole of the right kidney or 14.1 Hounsfield units, 9 mm low-density lesion medial to this in the right upper pole which is too small to characterize. No imaging follow-up is recommended. Remainder of both kidneys are unremarkable. There is no urinary stone or obstruction. There is no bladder thickening. The ureters insert low in the pelvis indicating pelvic floor laxity but there is no cystocele. Stomach/Bowel: The stomach is contracted. The small bowel is normal in caliber. An appendix is not seen in this patient. There is  moderate stool retention particularly in the cecum and ascending colon, proximal transverse segment with left-sided diverticulosis without evidence of diverticulitis. Diverticulosis most advanced in the sigmoid segment. There is a small rectocele protruding just below the level of the pelvic floor. Vascular/Lymphatic: Aortic atherosclerosis. No enlarged abdominal or pelvic lymph nodes. Branch artery calcifications are noted, greatest in the renal artery ostia. Reproductive: Surgically absent uterus. The left ovary is unremarkable. The right ovary demonstrates a 5 cm thin walled homogeneous simple appearing cyst of 13.2 Hounsfield units. No adnexal solid mass is seen. Other: There  is no free air, free hemorrhage or free fluid. There are small umbilical fat hernias. There is no incarcerated abdominal wall hernia. Multiple pelvic phleboliths. Musculoskeletal: There is osteopenia. There is a compression fracture of the T10 vertebral body, with transverse oblique linear fracture line through the inferior vertebral body, buckling along the anterior and posterior cortex, trace retropulsion of the posteroinferior cortex and approximately 15% loss of the overall vertebral body height. There is no extension into the pedicles and posterior elements. There is no associated herniated disc. There are degenerative changes of the lumbar spine greatest at L4-5 and L5-S1. Mild hip DJD and chondrocalcinosis. IMPRESSION: 1. No acute intracranial CT findings or depressed skull fractures. Calvarial osteopenia. 2. Small right occipitotemporal scalp hematoma, smaller than 2 days ago. 3. Osteopenia, degenerative changes, reversed lordosis in the cervical spine without evidence of fractures or traumatic listhesis. 4. Lower plate transverse oblique compression fracture of the T10 vertebral body with up to 15% loss of the overall vertebral body height, with cortical buckling anteriorly and posteriorly and trace retropulsion. No spinal canal hematoma is seen. 5. 8 mm irregular nodule in the right middle lobe. The usual recommendation per Fleischner guidelines would be for 6-12 month follow-up CT to ensure stability, with additional CT depending on whether or not the patient is high risk. In this case, follow-up imaging should be considered in light of the patient's advanced age and likelihood of intervention if the nodule shows growth. 6. 5 cm simple cyst right ovary. Three-month follow-up ultrasound for reassessment/recharacterization is suggested, taking into account advanced age. 7. No other acute CT findings. Chronic changes including constipation and diverticulosis. 8. Renal cysts and scarring with heavy  calcification in the renal artery ostia. 9. 1.4 cm gallstone appears impacted in the gallbladder neck but there is no gallbladder wall thickening. 10. Aortic atherosclerosis. 11. Small rectocele protruding below the level of the pelvic floor. Additional evidence of pelvic floor laxity with low pelvic ureteral insertions but no cystocele. Electronically Signed   By: Telford Nab M.D.   On: 12/16/2021 21:36   CT ABDOMEN PELVIS W CONTRAST  Result Date: 12/16/2021 CLINICAL DATA:  [place] 3 days ago, with hyponatremia and confusion. Neck pain. EXAM: CT HEAD WITHOUT CONTRAST CT CERVICAL SPINE WITHOUT CONTRAST CT ABDOMEN AND PELVIS WITH CONTRAST TECHNIQUE: Contiguous axial images were obtained from the base of the skull through the vertex without intravenous contrast. Multidetector CT imaging of the cervical spine was performed without intravenous contrast. Multiplanar CT image reconstructions were also generated. Multidetector CT imaging of the abdomen and pelvis was performed using the standard protocol following bolus administration of intravenous contrast. RADIATION DOSE REDUCTION: This exam was performed according to the departmental dose-optimization program which includes automated exposure control, adjustment of the mA and/or kV according to patient size and/or use of iterative reconstruction technique. CONTRAST:  36m OMNIPAQUE IOHEXOL 300 MG/ML  SOLN COMPARISON:  Head CT and cervical spine CT both 12/14/2021.  No prior abdomen pelvis CT. FINDINGS: CT HEAD FINDINGS Brain: There is moderately advanced cerebral atrophy with atrophic ventriculomegaly and mild cerebellar atrophy. There is moderate to severe small vessel disease of the cerebral white matter. No asymmetry is seen concerning for an acute infarct, hemorrhage or mass. There is no midline shift. Basal cisterns are clear. Vascular: There are calcifications in the carotid siphons. No hyperdense central vessel is seen. Skull: Negative for fractures or focal  lesions. 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 There is a small right occipitotemporal scalp hematoma which was larger previously. No new scalp hematoma is seen. Sinuses/Orbits: Old lens replacements. Visualized sinuses, bilateral mastoid air cells are clear. Other: None. CT CERVICAL SPINE FINDINGS Alignment: Stable findings. Reversed lordosis again noted centered at C5 and 2 mm grade 1 anterolisthesis C2-3, C3-4 and C4-5, trace retrolisthesis C5-6 and C6-7 all likely degenerative etiology. There is no widening of the anterior atlantodental joint. There is spurring along the C1-2 lateral mass articulations. Spurring of the anterior atlantodental joint. Skull base and vertebrae: Osteopenia. No fracture or pathologic bone lesion is seen. Soft tissues and spinal canal: No prevertebral fluid or swelling. No visible canal hematoma. A small left dorsal epidural calcified meningioma is again noted at C4-5, but there is no associated mass effect. Disc levels: There is advanced degenerative disc collapse with bidirectional osteophytes at C5-6 and C6-7, posterior disc osteophyte complexes flattening of the ventral cord surface at these levels. Other levels show lesser degenerative disc space loss minimal endplate spurring without mass effect. There is multilevel facet joint and uncinate hypertrophy with facet joint ankylosis on the left at C3-4. Degenerative foraminal stenosis is greatest on the left at C2-3 and C3-4, and on both sides at C4-5, and on the left again at C5-6 and C6-7. Upper chest: Negative. Other: None. CT ABDOMEN AND PELVIS FINDINGS Lower chest: There are atelectatic bands in the lower lobes and in the elevated right hemidiaphragm. There is an irregular 8 mm noncalcified nodule medially in the right middle lobe on series 4 axial image 2. Remaining lung bases are clear. There are scattered calcifications in the aortic valve leaflets and coronary arteries, mild cardiomegaly. Hepatobiliary: Breathing motion limits  evaluation. There is no obvious mass. There is a 1.4 cm stone in the gallbladder neck, which may be impacted but there is no gallbladder thickening or biliary dilatation. Pancreas: No focal abnormality is seen through the breathing motion. Spleen: No focal abnormality is seen through the breathing motion. No splenomegaly. Adrenals/Urinary Tract: There is no adrenal mass. There is a wedge-shaped cortical scar laterally in the upper pole left kidney. There is a homogeneous 2.1 cm thin walled cyst in the upper pole of the right kidney or 14.1 Hounsfield units, 9 mm low-density lesion medial to this in the right upper pole which is too small to characterize. No imaging follow-up is recommended. Remainder of both kidneys are unremarkable. There is no urinary stone or obstruction. There is no bladder thickening. The ureters insert low in the pelvis indicating pelvic floor laxity but there is no cystocele. Stomach/Bowel: The stomach is contracted. The small bowel is normal in caliber. An appendix is not seen in this patient. There is moderate stool retention particularly in the cecum and ascending colon, proximal transverse segment with left-sided diverticulosis without evidence of diverticulitis. Diverticulosis most advanced in the sigmoid segment. There is a small rectocele protruding just below the level of the pelvic floor. Vascular/Lymphatic: Aortic atherosclerosis. No enlarged abdominal or pelvic lymph nodes. Branch artery calcifications are  noted, greatest in the renal artery ostia. Reproductive: Surgically absent uterus. The left ovary is unremarkable. The right ovary demonstrates a 5 cm thin walled homogeneous simple appearing cyst of 13.2 Hounsfield units. No adnexal solid mass is seen. Other: There is no free air, free hemorrhage or free fluid. There are small umbilical fat hernias. There is no incarcerated abdominal wall hernia. Multiple pelvic phleboliths. Musculoskeletal: There is osteopenia. There is a  compression fracture of the T10 vertebral body, with transverse oblique linear fracture line through the inferior vertebral body, buckling along the anterior and posterior cortex, trace retropulsion of the posteroinferior cortex and approximately 15% loss of the overall vertebral body height. There is no extension into the pedicles and posterior elements. There is no associated herniated disc. There are degenerative changes of the lumbar spine greatest at L4-5 and L5-S1. Mild hip DJD and chondrocalcinosis. IMPRESSION: 1. No acute intracranial CT findings or depressed skull fractures. Calvarial osteopenia. 2. Small right occipitotemporal scalp hematoma, smaller than 2 days ago. 3. Osteopenia, degenerative changes, reversed lordosis in the cervical spine without evidence of fractures or traumatic listhesis. 4. Lower plate transverse oblique compression fracture of the T10 vertebral body with up to 15% loss of the overall vertebral body height, with cortical buckling anteriorly and posteriorly and trace retropulsion. No spinal canal hematoma is seen. 5. 8 mm irregular nodule in the right middle lobe. The usual recommendation per Fleischner guidelines would be for 6-12 month follow-up CT to ensure stability, with additional CT depending on whether or not the patient is high risk. In this case, follow-up imaging should be considered in light of the patient's advanced age and likelihood of intervention if the nodule shows growth. 6. 5 cm simple cyst right ovary. Three-month follow-up ultrasound for reassessment/recharacterization is suggested, taking into account advanced age. 7. No other acute CT findings. Chronic changes including constipation and diverticulosis. 8. Renal cysts and scarring with heavy calcification in the renal artery ostia. 9. 1.4 cm gallstone appears impacted in the gallbladder neck but there is no gallbladder wall thickening. 10. Aortic atherosclerosis. 11. Small rectocele protruding below the level  of the pelvic floor. Additional evidence of pelvic floor laxity with low pelvic ureteral insertions but no cystocele. Electronically Signed   By: Telford Nab M.D.   On: 12/16/2021 21:36   CT L-SPINE NO CHARGE  Result Date: 12/16/2021 CLINICAL DATA:  Fall 3 days prior.  Hyponatremia. EXAM: CT LUMBAR SPINE WITHOUT CONTRAST TECHNIQUE: Multidetector CT imaging of the lumbar spine was performed without intravenous contrast administration. Multiplanar CT image reconstructions were also generated. RADIATION DOSE REDUCTION: This exam was performed according to the departmental dose-optimization program which includes automated exposure control, adjustment of the mA and/or kV according to patient size and/or use of iterative reconstruction technique. COMPARISON:  Lumbar spine radiographs 12/14/2021 FINDINGS: Segmentation: There are 5 non-rib-bearing lumbar-type vertebral bodies. Alignment: No sagittal spondylolisthesis. Minimal right lateral listhesis of L4 on L5. No significant scoliotic curvature. Vertebrae: No lumbar vertebral body height loss. Please see contemporaneous CT thoracic spine report for evaluation of acute to subacute T10 vertebral body compression fracture. Severe left-greater-than-right L4-5 and severe right and moderate left L5-S1 disc space narrowing, endplate sclerosis, and peripheral osteophytosis. Paraspinal and other soft tissues: Moderate atherosclerotic calcifications within the abdominal aorta and iliac arteries. Tiny low density lesion within the partially visualized anterior right kidney measuring up to 11 x 9 mm, too small to further characterize but statistically most likely a cyst. No follow-up imaging is recommended. Minimally partially  visualized distal colonic diverticulosis. Disc levels: L1-2: Mild-to-moderate bilateral facet joint hypertrophy. No central canal or neuroforaminal stenosis. L2-3: Mild-to-moderate bilateral facet joint hypertrophy. Mild posterior endplate spurring.  Mild left-greater-than-right neuroforaminal stenosis. Moderate narrowing of the lateral recesses and mild central canal stenosis. L3-4: Moderate bilateral facet joint hypertrophy. Mild broad-based posterior disc osteophyte complex. Borderline mild right neuroforaminal stenosis. Moderate to severe central canal stenosis. L4-5: Moderate bilateral facet joint hypertrophy. Mild broad-based posterior disc osteophyte complex with moderate left intraforaminal endplate spurring. Moderate left and mild right neuroforaminal stenosis. Moderate narrowing of the lateral recesses and moderate central canal stenosis. L5-S1: Moderate bilateral facet joint hypertrophy. Minimal broad-based posterior disc osteophyte complex with moderate right intraforaminal endplate spurring. Mild-to-moderate right neuroforaminal stenosis. No central canal stenosis. IMPRESSION: 1. Please see contemporaneous CT of the thoracic spine for description of an acute to subacute inferior T10 vertebral body compression fracture. 2. No lumbar vertebral body acute fracture. 3. Multilevel degenerative disc and joint changes as above. Mild L2-3, moderate to severe L3-4, and moderate L4-5 central canal stenosis. 4. Multilevel neuroforaminal stenosis as above. The greatest levels include moderate left L4-5 and mild-to-moderate right L5-S1 neuroforaminal stenosis. Electronically Signed   By: Yvonne Kendall M.D.   On: 12/16/2021 21:32   CT Thoracic Spine Wo Contrast  Result Date: 12/16/2021 CLINICAL DATA:  Back trauma.  Fall 3 days prior.  Hyponatremia. EXAM: CT THORACIC SPINE WITHOUT CONTRAST TECHNIQUE: Multidetector CT images of the thoracic were obtained using the standard protocol without intravenous contrast. RADIATION DOSE REDUCTION: This exam was performed according to the departmental dose-optimization program which includes automated exposure control, adjustment of the mA and/or kV according to patient size and/or use of iterative reconstruction  technique. COMPARISON:  Thoracic spine radiographs 12/14/2021 FINDINGS: Alignment: No sagittal spondylolisthesis. Vertebrae: There is minimal approximate 5% height loss of the mid T10 vertebral body. This was seen on recent 12/14/2021 radiographs. There is an acute to subacute horizontal linear fracture line extending from the anterior mid to inferior T10 vertebral body cortex through the posteroinferior vertebral body cortex. Mild 3 mm anterior and 3 mm posterior T10 vertebral body cortical buckling (sagittal series 6, image 23). There is approximate 30% height loss of the mid to anterior T4 vertebral body, similar to recent 12/15/2021 radiographs. There is chronic sclerosis within the T4 superior endplate. No acute fracture line is seen. No significant retropulsion. Mild T3-4 through T6-7 disc space narrowing. Paraspinal and other soft tissues: Mild-to-moderate atherosclerotic calcifications within the partially visualized thoracic aorta. Mild curvilinear likely subsegmental atelectasis within the partially visualized medial aspect of the lungs. Coronary artery calcifications are noted. Disc levels: T10-11: Mild retropulsion of the posteroinferior T10 vertebral body. Mild bilateral facet joint hypertrophy. Moderate right and borderline mild left neuroforaminal stenosis. No central canal stenosis. IMPRESSION: 1. Acute to subacute T10 vertebral body compression fracture with minimal 5 mm height loss. There is mild posteroinferior T10 vertebral body retropulsion. Moderate right and borderline mild left T10-11 neuroforaminal stenosis. 2. Chronic appearing 30% height loss of the mid to anterior T4 vertebral body. Aortic Atherosclerosis (ICD10-I70.0). Electronically Signed   By: Yvonne Kendall M.D.   On: 12/16/2021 21:20     Data Reviewed: Relevant notes from primary care and specialist visits, past discharge summaries as available in EHR, including Care Everywhere. Prior diagnostic testing as pertinent to current  admission diagnoses Updated medications and problem lists for reconciliation ED course, including vitals, labs, imaging, treatment and response to treatment Triage notes, nursing and pharmacy notes and ED provider's  notes Notable results as noted in HPI   Assessment and Plan: * Acute metabolic encephalopathy Secondary to acute hyponatremia, possible postconcussive from fall, possible underlying infection Neurologic checks, fall precautions Patient had urinalysis consistent with UTI but urine culture showed multiple species with recommendation for recollection Urine recollected We will continue home Keflex   Back pain secondary to closed wedge compression fracture of tenth thoracic vertebra (HCC) Acuity uncertain CT chest thoracic spine showed "Lower plate transverse oblique compression fracture of the T10 vertebral body with up to 15% loss of the overall vertebral body height, with cortical buckling anteriorly and posteriorly and trace retropulsion Continue TLSO brace ordered from the ED Pain control Consider neurosurgery consult/IR for kyphoplasty  Cholelithiasis Elevated bilirubin Bilirubin elevated at 1.4 above 0.6 a couple days prior patient symptomatic for nausea.  WBC elevated.  LFTs normal CT shows 1.4 cm gallstone appears impacted in the gallbladder neck but there is no gallbladder wall thickening. Continue to monitor renal function.  We will add on lipase   Recent fall with LOC 12/14/21 Etiology of fall uncertain.  Patient has improving occipitotemporal scalp hematoma without intracranial injury Fall precautions PT eval Neurologic checks  HTN (hypertension) Elevated BP with systolic in the 741O to 878M Continue lisinopril and verapamil We will DC HCTZ given hyponatremia  Acute hyponatremia Hypotonic and suspect hypovolemic IV NS bolus and recheck sodium We will check urine sodium        DVT prophylaxis: Lovenox  Consults: none  Advance Care Planning:  full  Family Communication: none  Disposition Plan: Back to previous home environment  Severity of Illness: The appropriate patient status for this patient is INPATIENT. Inpatient status is judged to be reasonable and necessary in order to provide the required intensity of service to ensure the patient's safety. The patient's presenting symptoms, physical exam findings, and initial radiographic and laboratory data in the context of their chronic comorbidities is felt to place them at high risk for further clinical deterioration. Furthermore, it is not anticipated that the patient will be medically stable for discharge from the hospital within 2 midnights of admission.   * I certify that at the point of admission it is my clinical judgment that the patient will require inpatient hospital care spanning beyond 2 midnights from the point of admission due to high intensity of service, high risk for further deterioration and high frequency of surveillance required.*  Author: Athena Masse, MD 12/16/2021 10:43 PM  For on call review [email or web address].

## 2021-12-16 NOTE — ED Triage Notes (Signed)
Pt to ED from home with family c/o hyponatremia.  Patient had follow up with PCP today d/t fall on Saturday.  Blood work showed sodium of 118 and was told to come to ED.  Family states patient acting more confused, moving slower than normal, and "talks like she's drunk".  Family states concerns of abd bloating, no BM for 3 days.  Pt alert, hard of hearing, decreased appetite, chest rise even and unlabored, bruises from previous fall.  Dr. Ellender Hose to bedside on arrival to room.

## 2021-12-16 NOTE — Assessment & Plan Note (Addendum)
Elevated bilirubin Bilirubin elevated at 1.4 above 0.6 a couple days prior patient symptomatic for nausea.  WBC elevated.  LFTs normal CT shows 1.4 cm gallstone appears impacted in the gallbladder neck but there is no gallbladder wall thickening. Continue to monitor renal function.  We will add on lipase

## 2021-12-16 NOTE — Assessment & Plan Note (Addendum)
Elevated BP with systolic in the 031R to 945O Continue lisinopril and verapamil We will DC HCTZ given hyponatremia

## 2021-12-16 NOTE — ED Notes (Signed)
[facility] called ortho tech for a TLSO brace for pt.  Tech reports that since pt will be admitted they will get it for her in the am.

## 2021-12-16 NOTE — Assessment & Plan Note (Addendum)
Secondary to acute hyponatremia, possible postconcussive from fall, possible underlying infection Neurologic checks, fall precautions Patient had urinalysis consistent with UTI but urine culture showed multiple species with recommendation for recollection Urine recollected We will continue home Keflex

## 2021-12-16 NOTE — Assessment & Plan Note (Signed)
Hypotonic and suspect hypovolemic IV NS bolus and recheck sodium We will check urine sodium

## 2021-12-17 ENCOUNTER — Encounter: Payer: Self-pay | Admitting: Internal Medicine

## 2021-12-17 DIAGNOSIS — S22070A Wedge compression fracture of T9-T10 vertebra, initial encounter for closed fracture: Secondary | ICD-10-CM

## 2021-12-17 DIAGNOSIS — Z66 Do not resuscitate: Secondary | ICD-10-CM

## 2021-12-17 DIAGNOSIS — G9341 Metabolic encephalopathy: Secondary | ICD-10-CM | POA: Diagnosis not present

## 2021-12-17 LAB — HEPATIC FUNCTION PANEL
ALT: 25 U/L (ref 0–44)
AST: 24 U/L (ref 15–41)
Albumin: 3.4 g/dL — ABNORMAL LOW (ref 3.5–5.0)
Alkaline Phosphatase: 50 U/L (ref 38–126)
Bilirubin, Direct: 0.2 mg/dL (ref 0.0–0.2)
Indirect Bilirubin: 1.1 mg/dL — ABNORMAL HIGH (ref 0.3–0.9)
Total Bilirubin: 1.3 mg/dL — ABNORMAL HIGH (ref 0.3–1.2)
Total Protein: 5.5 g/dL — ABNORMAL LOW (ref 6.5–8.1)

## 2021-12-17 LAB — BASIC METABOLIC PANEL
Anion gap: 6 (ref 5–15)
Anion gap: 7 (ref 5–15)
BUN: 14 mg/dL (ref 8–23)
BUN: 13 mg/dL (ref 8–23)
CO2: 26 mmol/L (ref 22–32)
CO2: 24 mmol/L (ref 22–32)
Calcium: 9.3 mg/dL (ref 8.9–10.3)
Calcium: 9 mg/dL (ref 8.9–10.3)
Chloride: 93 mmol/L — ABNORMAL LOW (ref 98–111)
Chloride: 97 mmol/L — ABNORMAL LOW (ref 98–111)
Creatinine, Ser: 0.68 mg/dL (ref 0.44–1.00)
Creatinine, Ser: 0.68 mg/dL (ref 0.44–1.00)
GFR, Estimated: >60 mL/min (ref >60)
GFR, Estimated: >60 mL/min (ref >60)
Glucose, Bld: 95 mg/dL (ref 70–99)
Glucose, Bld: 146 mg/dL — ABNORMAL HIGH (ref 70–99)
Potassium: 3.9 mmol/L (ref 3.5–5.1)
Potassium: 3.7 mmol/L (ref 3.5–5.1)
Sodium: 125 mmol/L — ABNORMAL LOW (ref 135–145)
Sodium: 128 mmol/L — ABNORMAL LOW (ref 135–145)

## 2021-12-17 LAB — URINALYSIS, ROUTINE W REFLEX MICROSCOPIC
Bilirubin Urine: NEGATIVE
Glucose, UA: NEGATIVE mg/dL
Hgb urine dipstick: NEGATIVE
Ketones, ur: 20 mg/dL — AB
Leukocytes,Ua: NEGATIVE
Nitrite: NEGATIVE
Protein, ur: NEGATIVE mg/dL
Specific Gravity, Urine: 1.013 (ref 1.005–1.030)
pH: 6 (ref 5.0–8.0)

## 2021-12-17 LAB — SODIUM, URINE, RANDOM: Sodium, Ur: 18 mmol/L

## 2021-12-17 LAB — CBC
HCT: 33.1 % — ABNORMAL LOW (ref 36.0–46.0)
Hemoglobin: 11.7 g/dL — ABNORMAL LOW (ref 12.0–15.0)
MCH: 33.1 pg (ref 26.0–34.0)
MCHC: 35.3 g/dL (ref 30.0–36.0)
MCV: 93.8 fL (ref 80.0–100.0)
Platelets: 168 10*3/uL (ref 150–400)
RBC: 3.53 MIL/uL — ABNORMAL LOW (ref 3.87–5.11)
RDW: 13.4 % (ref 11.5–15.5)
WBC: 9.5 10*3/uL (ref 4.0–10.5)
nRBC: 0 % (ref 0.0–0.2)

## 2021-12-17 LAB — LIPASE, BLOOD: Lipase: 34 U/L (ref 11–51)

## 2021-12-17 LAB — OSMOLALITY: Osmolality: 266 mOsm/kg — ABNORMAL LOW (ref 275–295)

## 2021-12-17 LAB — SODIUM: Sodium: 128 mmol/L — ABNORMAL LOW (ref 135–145)

## 2021-12-17 LAB — OSMOLALITY, URINE: Osmolality, Ur: 241 mOsm/kg — ABNORMAL LOW (ref 300–900)

## 2021-12-17 MED ORDER — SODIUM CHLORIDE 0.9 % IV SOLN: INTRAVENOUS | Status: DC

## 2021-12-17 MED ORDER — VERAPAMIL HCL ER 240 MG PO TBCR
240.0000 mg | EXTENDED_RELEASE_TABLET | Freq: Every day | ORAL | Status: DC
  Filled 2021-12-17: qty 1

## 2021-12-17 MED ORDER — POTASSIUM CHLORIDE CRYS ER 10 MEQ PO TBCR
20.0000 meq | EXTENDED_RELEASE_TABLET | Freq: Every day | ORAL | Status: DC
  Administered 2021-12-17 – 2021-12-22 (×6): 20 meq via ORAL
  Filled 2021-12-17 (×7): qty 2

## 2021-12-17 MED ORDER — POLYETHYLENE GLYCOL 3350 17 G PO PACK
17.0000 g | PACK | Freq: Two times a day (BID) | ORAL | Status: AC
  Administered 2021-12-17 (×2): 17 g via ORAL
  Filled 2021-12-17 (×2): qty 1

## 2021-12-17 MED ORDER — ONDANSETRON HCL 4 MG/2ML IJ SOLN: 4.0000 mg | Freq: Four times a day (QID) | INTRAMUSCULAR | Status: DC | PRN

## 2021-12-17 MED ORDER — HYDROCODONE-ACETAMINOPHEN 5-325 MG PO TABS
1.0000 | ORAL_TABLET | ORAL | Status: DC | PRN
  Administered 2021-12-17 – 2021-12-19 (×5): 1 via ORAL
  Filled 2021-12-17 (×5): qty 1

## 2021-12-17 MED ORDER — LISINOPRIL 20 MG PO TABS
40.0000 mg | ORAL_TABLET | Freq: Every day | ORAL | Status: DC
  Administered 2021-12-17 – 2021-12-22 (×6): 40 mg via ORAL
  Filled 2021-12-17: qty 2
  Filled 2021-12-17: qty 4
  Filled 2021-12-17 (×4): qty 2

## 2021-12-17 MED ORDER — ONDANSETRON HCL 4 MG PO TABS
4.0000 mg | ORAL_TABLET | Freq: Four times a day (QID) | ORAL | Status: DC | PRN
  Administered 2021-12-18 (×2): 4 mg via ORAL
  Filled 2021-12-17 (×2): qty 1

## 2021-12-17 MED ORDER — ASPIRIN 81 MG PO TBEC
81.0000 mg | DELAYED_RELEASE_TABLET | Freq: Every day | ORAL | Status: DC
  Administered 2021-12-17 – 2021-12-18 (×2): 81 mg via ORAL
  Filled 2021-12-17 (×2): qty 1

## 2021-12-17 MED ORDER — ENOXAPARIN SODIUM 40 MG/0.4ML IJ SOSY
40.0000 mg | PREFILLED_SYRINGE | INTRAMUSCULAR | Status: DC
  Administered 2021-12-17 – 2021-12-22 (×6): 40 mg via SUBCUTANEOUS
  Filled 2021-12-17 (×6): qty 0.4

## 2021-12-17 MED ORDER — ACETAMINOPHEN 650 MG RE SUPP: 650.0000 mg | Freq: Four times a day (QID) | RECTAL | Status: DC | PRN

## 2021-12-17 MED ORDER — METHOTREXATE 2.5 MG PO TABS
10.0000 mg | ORAL_TABLET | ORAL | Status: DC
  Administered 2021-12-22: 10 mg via ORAL
  Filled 2021-12-17: qty 4

## 2021-12-17 MED ORDER — SENNOSIDES-DOCUSATE SODIUM 8.6-50 MG PO TABS
2.0000 | ORAL_TABLET | Freq: Two times a day (BID) | ORAL | Status: AC
  Administered 2021-12-17 (×2): 2 via ORAL
  Filled 2021-12-17 (×2): qty 2

## 2021-12-17 MED ORDER — PANTOPRAZOLE SODIUM 40 MG PO TBEC
40.0000 mg | DELAYED_RELEASE_TABLET | Freq: Every day | ORAL | Status: DC
  Administered 2021-12-17 – 2021-12-22 (×6): 40 mg via ORAL
  Filled 2021-12-17 (×6): qty 1

## 2021-12-17 MED ORDER — ACETAMINOPHEN 325 MG PO TABS
650.0000 mg | ORAL_TABLET | Freq: Four times a day (QID) | ORAL | Status: DC | PRN
  Administered 2021-12-19 – 2021-12-22 (×10): 650 mg via ORAL
  Filled 2021-12-17 (×10): qty 2

## 2021-12-17 NOTE — Progress Notes (Addendum)
PROGRESS NOTE    Lisa Koch   ZOX:[id number] DOB: Jul 18, 1924  DOA: 12/16/2021 Date of Service: 12/17/21 PCP: Tracie Harrier, MD     Brief Narrative / Hospital Course:  Lisa Koch is a 88 y.o. female with medical history significant for HTN, seen in the ED on 10/01 with a fall with brief LOC, treated for UTI(culture with 10,000 urogenital flora) and discharged who was sent by her PCP for admission for hyponatremia.  Patient presented to her PCP for post ED follow-up and family was concerned about ongoing confusion, persistent nausea and persistent thoracic and lower back pain.  She had a CT scan on 10/one of her spine that showed chronic compression fractures.  They also noted that she had a slightly distended abdomen that she had not had a bowel movement in 3 days. 10/03: BP 171/67, pulse 60, respirations 21 with O2 sat 96% on room air and afebrile.  Labs significant for sodium 128, down from 132 on 10/01.  Bilirubin 3.1 up from 0.6 on 10/01.  WBC 13.5, about the same as 2 days ago in spite of treatment with Keflex for UTI.  Serum osmolality 252, urine osmolality 215. Trauma imaging including CT head, C-spine and abdomen and pelvis with mostly nonacute findings.  Notable findings included a T10 vertebral body compression fracture with 15% height loss, 1.4 cm gallstone impacted in the gallbladder neck without gallbladder wall thickening. Patient given sodium chloride bolus.  A TLSO brace was applied.  Hospitalist consulted for admission - admitted for acute metabolic encephalopathy secondary to hyponatremia, possibly postconcussive from fall, versus possible underlying infection.  Continued on home Keflex, urine recollected.  Continued on IV normal saline for hyponatremia.  Concern for cholelithiasis/elevated bilirubin, monitoring hepatic function and added on lipase.  Neurosurgery consulted: Maintain TLSO brace but okay to remove while in bed, PT/OT, arrange outpatient follow-up. 10/04:  bradycardic into 30s-40s per RN early AM, EKG no concerns, sinus. HR improved on rounds. Continue treat pain and hyponatremia.   Consultants:  Neurosurgery - no procedures planned  Procedures: none      ASSESSMENT & PLAN:   Principal Problem:   Acute metabolic encephalopathy Active Problems:   Acute hyponatremia   Cholelithiasis   HTN (hypertension)   Recent fall with LOC 12/14/21   Elevated bilirubin   Back pain secondary to closed wedge compression fracture of tenth thoracic vertebra (HCC)  Acute hyponatremia Hypotonic and suspect hypovolemic, also thiazide effect  IV NS bolus and continuous fluids after that have been done  D/c home HCTZ Serum osmolality 252, urine osmolality 215 and urine sodium 12 Continue IV NS --> discontinued afternoon 12/17/2021 due to sodium going from 120-128 in less than 24 hours, will recehck in PM  Cholelithiasis Elevated bilirubin Bilirubin elevated at 1.4 above 0.6 a couple days prior patient symptomatic for nausea.  WBC elevated.  LFTs normal CT shows 1.4 cm gallstone appears impacted in the gallbladder neck but there is no gallbladder wall thickening. Continue to monitor hepatic function --> stable and WNL will add on lipase --> WNL  Acute metabolic encephalopathy Secondary to acute hyponatremia, possible postconcussive from fall, possible underlying infection Patient had urinalysis consistent with UTI but urine culture showed multiple species with recommendation for recollection Mental status appears improved today 12/17/21 alert/oriented  Neurologic checks, fall precautions Urine recollected Continue home Keflex  HTN (hypertension) Elevated BP with systolic in the 811B to 147W Continue lisinopril and verapamil DC HCTZ given hyponatremia  Back pain secondary to closed wedge  compression fracture of tenth thoracic vertebra (HCC) Continue TLSO brace while OOB Pain control Outpatient follow-up with neurosurgery  Recent fall with  LOC 12/14/21 Etiology of fall uncertain.  Patient has improving occipitotemporal scalp hematoma without intracranial injury Fall precautions PT eval Neurologic checks      DVT prophylaxis: lovenox Pertinent IV fluids/nutrition: NS treating hyponatremia Central lines / invasive devices: none  Code Status: spoke w/ patient who is oriented x4, and family also present. She and family report DNR, her papaerwork is at her house but she verbally confirms today and family agrees Family Communication: son and his wife are at bedside on rounds   Disposition: inpatient TOC needs: may need HH, family states would not want SNF, PT/OT to eval tomorrow.  Barriers to discharge / significant pending items: hyponatremia             Subjective:  Patient reports pain in back and complaints that the coffee is cold, otherwise no concerns. No chest pain or SOB, mild coughing, Son and his wife have no additional concerns besides abdominal bloating.        Objective:  Vitals:   12/17/21 1230 12/17/21 1300 12/17/21 1401 12/17/21 1612  BP: (!) 150/70 138/63 (!) 166/95 (!) 156/75  Pulse: 67 73 74 76  Resp: (!) '24 20 16 16  '$ Temp:  97.8 F (36.6 C) 98 F (36.7 C) 99 F (37.2 C)  TempSrc:  Oral Oral   SpO2: 97% 97% 97% 96%  Weight:      Height:        Intake/Output Summary (Last 24 hours) at 12/17/2021 1643 Last data filed at 12/17/2021 1503 Gross per 24 hour  Intake 993.28 ml  Output 900 ml  Net 93.28 ml   Filed Weights   12/16/21 1944  Weight: 60 kg    Examination:  Constitutional:  VS as above General Appearance: alert, NAD. Very hard of hearing.  Ears, Nose, Mouth, Throat: Normal external appearance MMM Neck: No masses, trachea midline Respiratory: - exam limited d/t brace Normal respiratory effort No wheeze No rhonchi No rales Cardiovascular: S1/S2 normal RRR No murmur No rub/gallop auscultated No lower extremity edema Gastrointestinal: No  tenderness Musculoskeletal:  No clubbing/cyanosis of digits Symmetrical movement in all extremities Neurological: No cranial nerve deficit on limited exam Alert, oriented x4 (person, place, time, situation) Psychiatric: Normal judgment/insight Pleasant mood and affect       Scheduled Medications:   aspirin EC  81 mg Oral Daily   enoxaparin (LOVENOX) injection  40 mg Subcutaneous Q24H   lisinopril  40 mg Oral Daily   [START ON 12/22/2021] methotrexate  10 mg Oral Weekly   pantoprazole  40 mg Oral Daily   polyethylene glycol  17 g Oral BID   potassium chloride  20 mEq Oral q1800   senna-docusate  2 tablet Oral BID    Continuous Infusions:    PRN Medications:  acetaminophen **OR** acetaminophen, HYDROcodone-acetaminophen, ondansetron **OR** ondansetron (ZOFRAN) IV  Antimicrobials:  Anti-infectives (From admission, onward)    None       Data Reviewed: I have personally reviewed following labs and imaging studies  CBC: Recent Labs  Lab 12/14/21 0022 12/16/21 1953 12/17/21 0400  WBC 13.9* 13.5* 9.5  NEUTROABS 11.1* 11.1*  --   HGB 12.7 12.2 11.7*  HCT 37.7 34.8* 33.1*  MCV 96.9 93.3 93.8  PLT 200 196 578   Basic Metabolic Panel: Recent Labs  Lab 12/14/21 0022 12/16/21 1953 12/17/21 0400 12/17/21 1049  NA 132* 120*  125* 128*  K 4.2 4.3 3.9 3.7  CL 100 85* 93* 97*  CO2 '24 24 26 24  '$ GLUCOSE 237* 106* 95 146*  BUN 28* '17 14 13  '$ CREATININE 1.09* 0.68 0.68 0.68  CALCIUM 9.3 10.1 9.3 9.0   GFR: Estimated Creatinine Clearance: 34.1 mL/min (by C-G formula based on SCr of 0.68 mg/dL). Liver Function Tests: Recent Labs  Lab 12/14/21 0022 12/16/21 1953 12/17/21 0400  AST '28 27 24  '$ ALT '25 27 25  '$ ALKPHOS 68 53 50  BILITOT 0.6 1.3* 1.3*  PROT 6.2* 6.0* 5.5*  ALBUMIN 3.8 3.5 3.4*   Recent Labs  Lab 12/14/21 0022 12/17/21 0400  LIPASE 47 34   No results for input(s): "AMMONIA" in the last 168 hours. Coagulation Profile: No results for input(s):  "INR", "PROTIME" in the last 168 hours. Cardiac Enzymes: No results for input(s): "CKTOTAL", "CKMB", "CKMBINDEX", "TROPONINI" in the last 168 hours. BNP (last 3 results) No results for input(s): "PROBNP" in the last 8760 hours. HbA1C: No results for input(s): "HGBA1C" in the last 72 hours. CBG: No results for input(s): "GLUCAP" in the last 168 hours. Lipid Profile: No results for input(s): "CHOL", "HDL", "LDLCALC", "TRIG", "CHOLHDL", "LDLDIRECT" in the last 72 hours. Thyroid Function Tests: No results for input(s): "TSH", "T4TOTAL", "FREET4", "T3FREE", "THYROIDAB" in the last 72 hours. Anemia Panel: No results for input(s): "VITAMINB12", "FOLATE", "FERRITIN", "TIBC", "IRON", "RETICCTPCT" in the last 72 hours. Urine analysis:    Component Value Date/Time   COLORURINE STRAW (A) 12/16/2021 2349   APPEARANCEUR CLEAR (A) 12/16/2021 2349   LABSPEC 1.013 12/16/2021 2349   PHURINE 6.0 12/16/2021 2349   GLUCOSEU NEGATIVE 12/16/2021 2349   HGBUR NEGATIVE 12/16/2021 2349   BILIRUBINUR NEGATIVE 12/16/2021 2349   KETONESUR 20 (A) 12/16/2021 2349   PROTEINUR NEGATIVE 12/16/2021 2349   NITRITE NEGATIVE 12/16/2021 2349   LEUKOCYTESUR NEGATIVE 12/16/2021 2349   Sepsis Labs: '@LABRCNTIP'$ (procalcitonin:4,lacticidven:4)  Recent Results (from the past 240 hour(s))  Urine Culture     Status: Abnormal   Collection Time: 12/14/21  1:40 AM   Specimen: Urine, Clean Catch  Result Value Ref Range Status   Specimen Description   Final    URINE, CLEAN CATCH Performed at [facility], [address], [place], [place] [address]    Special Requests   Final    NONE Performed at [facility], [address], [place], [place] [address]    Culture (A)  Final    10,000 COLONIES/mL MULTIPLE SPECIES PRESENT, SUGGEST RECOLLECTION   Report Status 12/15/2021 FINAL  Final  Blood culture (single)     Status: None (Preliminary result)   Collection Time: 12/16/21  7:54 PM   Specimen: BLOOD   Result Value Ref Range Status   Specimen Description BLOOD LEFT FOREARM  Final   Special Requests   Final    BOTTLES DRAWN AEROBIC AND ANAEROBIC Blood Culture results may not be optimal due to an inadequate volume of blood received in culture bottles   Culture   Final    NO GROWTH < 12 HOURS Performed at [facility], [address], [place], [place] [address]    Report Status PENDING  Incomplete         Radiology Studies: CT HEAD WO CONTRAST (5MM)  Result Date: 12/16/2021 CLINICAL DATA:  [place] 3 days ago, with hyponatremia and confusion. Neck pain. EXAM: CT HEAD WITHOUT CONTRAST CT CERVICAL SPINE WITHOUT CONTRAST CT ABDOMEN AND PELVIS WITH CONTRAST TECHNIQUE: Contiguous axial images were obtained  from the base of the skull through the vertex without intravenous contrast. Multidetector CT imaging of the cervical spine was performed without intravenous contrast. Multiplanar CT image reconstructions were also generated. Multidetector CT imaging of the abdomen and pelvis was performed using the standard protocol following bolus administration of intravenous contrast. RADIATION DOSE REDUCTION: This exam was performed according to the departmental dose-optimization program which includes automated exposure control, adjustment of the mA and/or kV according to patient size and/or use of iterative reconstruction technique. CONTRAST:  60m OMNIPAQUE IOHEXOL 300 MG/ML  SOLN COMPARISON:  Head CT and cervical spine CT both 12/14/2021. No prior abdomen pelvis CT. FINDINGS: CT HEAD FINDINGS Brain: There is moderately advanced cerebral atrophy with atrophic ventriculomegaly and mild cerebellar atrophy. There is moderate to severe small vessel disease of the cerebral white matter. No asymmetry is seen concerning for an acute infarct, hemorrhage or mass. There is no midline shift. Basal cisterns are clear. Vascular: There are calcifications in the carotid siphons. No hyperdense central vessel is seen.  Skull: Negative for fractures or focal lesions. 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 There is a small right occipitotemporal scalp hematoma which was larger previously. No new scalp hematoma is seen. Sinuses/Orbits: Old lens replacements. Visualized sinuses, bilateral mastoid air cells are clear. Other: None. CT CERVICAL SPINE FINDINGS Alignment: Stable findings. Reversed lordosis again noted centered at C5 and 2 mm grade 1 anterolisthesis C2-3, C3-4 and C4-5, trace retrolisthesis C5-6 and C6-7 all likely degenerative etiology. There is no widening of the anterior atlantodental joint. There is spurring along the C1-2 lateral mass articulations. Spurring of the anterior atlantodental joint. Skull base and vertebrae: Osteopenia. No fracture or pathologic bone lesion is seen. Soft tissues and spinal canal: No prevertebral fluid or swelling. No visible canal hematoma. A small left dorsal epidural calcified meningioma is again noted at C4-5, but there is no associated mass effect. Disc levels: There is advanced degenerative disc collapse with bidirectional osteophytes at C5-6 and C6-7, posterior disc osteophyte complexes flattening of the ventral cord surface at these levels. Other levels show lesser degenerative disc space loss minimal endplate spurring without mass effect. There is multilevel facet joint and uncinate hypertrophy with facet joint ankylosis on the left at C3-4. Degenerative foraminal stenosis is greatest on the left at C2-3 and C3-4, and on both sides at C4-5, and on the left again at C5-6 and C6-7. Upper chest: Negative. Other: None. CT ABDOMEN AND PELVIS FINDINGS Lower chest: There are atelectatic bands in the lower lobes and in the elevated right hemidiaphragm. There is an irregular 8 mm noncalcified nodule medially in the right middle lobe on series 4 axial image 2. Remaining lung bases are clear. There are scattered calcifications in the aortic valve leaflets and coronary arteries, mild cardiomegaly.  Hepatobiliary: Breathing motion limits evaluation. There is no obvious mass. There is a 1.4 cm stone in the gallbladder neck, which may be impacted but there is no gallbladder thickening or biliary dilatation. Pancreas: No focal abnormality is seen through the breathing motion. Spleen: No focal abnormality is seen through the breathing motion. No splenomegaly. Adrenals/Urinary Tract: There is no adrenal mass. There is a wedge-shaped cortical scar laterally in the upper pole left kidney. There is a homogeneous 2.1 cm thin walled cyst in the upper pole of the right kidney or 14.1 Hounsfield units, 9 mm low-density lesion medial to this in the right upper pole which is too small to characterize. No imaging follow-up is recommended. Remainder of both kidneys are unremarkable. There  is no urinary stone or obstruction. There is no bladder thickening. The ureters insert low in the pelvis indicating pelvic floor laxity but there is no cystocele. Stomach/Bowel: The stomach is contracted. The small bowel is normal in caliber. An appendix is not seen in this patient. There is moderate stool retention particularly in the cecum and ascending colon, proximal transverse segment with left-sided diverticulosis without evidence of diverticulitis. Diverticulosis most advanced in the sigmoid segment. There is a small rectocele protruding just below the level of the pelvic floor. Vascular/Lymphatic: Aortic atherosclerosis. No enlarged abdominal or pelvic lymph nodes. Branch artery calcifications are noted, greatest in the renal artery ostia. Reproductive: Surgically absent uterus. The left ovary is unremarkable. The right ovary demonstrates a 5 cm thin walled homogeneous simple appearing cyst of 13.2 Hounsfield units. No adnexal solid mass is seen. Other: There is no free air, free hemorrhage or free fluid. There are small umbilical fat hernias. There is no incarcerated abdominal wall hernia. Multiple pelvic phleboliths.  Musculoskeletal: There is osteopenia. There is a compression fracture of the T10 vertebral body, with transverse oblique linear fracture line through the inferior vertebral body, buckling along the anterior and posterior cortex, trace retropulsion of the posteroinferior cortex and approximately 15% loss of the overall vertebral body height. There is no extension into the pedicles and posterior elements. There is no associated herniated disc. There are degenerative changes of the lumbar spine greatest at L4-5 and L5-S1. Mild hip DJD and chondrocalcinosis. IMPRESSION: 1. No acute intracranial CT findings or depressed skull fractures. Calvarial osteopenia. 2. Small right occipitotemporal scalp hematoma, smaller than 2 days ago. 3. Osteopenia, degenerative changes, reversed lordosis in the cervical spine without evidence of fractures or traumatic listhesis. 4. Lower plate transverse oblique compression fracture of the T10 vertebral body with up to 15% loss of the overall vertebral body height, with cortical buckling anteriorly and posteriorly and trace retropulsion. No spinal canal hematoma is seen. 5. 8 mm irregular nodule in the right middle lobe. The usual recommendation per Fleischner guidelines would be for 6-12 month follow-up CT to ensure stability, with additional CT depending on whether or not the patient is high risk. In this case, follow-up imaging should be considered in light of the patient's advanced age and likelihood of intervention if the nodule shows growth. 6. 5 cm simple cyst right ovary. Three-month follow-up ultrasound for reassessment/recharacterization is suggested, taking into account advanced age. 7. No other acute CT findings. Chronic changes including constipation and diverticulosis. 8. Renal cysts and scarring with heavy calcification in the renal artery ostia. 9. 1.4 cm gallstone appears impacted in the gallbladder neck but there is no gallbladder wall thickening. 10. Aortic  atherosclerosis. 11. Small rectocele protruding below the level of the pelvic floor. Additional evidence of pelvic floor laxity with low pelvic ureteral insertions but no cystocele. Electronically Signed   By: Telford Nab M.D.   On: 12/16/2021 21:36   CT Cervical Spine Wo Contrast  Result Date: 12/16/2021 CLINICAL DATA:  [place] 3 days ago, with hyponatremia and confusion. Neck pain. EXAM: CT HEAD WITHOUT CONTRAST CT CERVICAL SPINE WITHOUT CONTRAST CT ABDOMEN AND PELVIS WITH CONTRAST TECHNIQUE: Contiguous axial images were obtained from the base of the skull through the vertex without intravenous contrast. Multidetector CT imaging of the cervical spine was performed without intravenous contrast. Multiplanar CT image reconstructions were also generated. Multidetector CT imaging of the abdomen and pelvis was performed using the standard protocol following bolus administration of intravenous contrast. RADIATION DOSE REDUCTION: This  exam was performed according to the departmental dose-optimization program which includes automated exposure control, adjustment of the mA and/or kV according to patient size and/or use of iterative reconstruction technique. CONTRAST:  67m OMNIPAQUE IOHEXOL 300 MG/ML  SOLN COMPARISON:  Head CT and cervical spine CT both 12/14/2021. No prior abdomen pelvis CT. FINDINGS: CT HEAD FINDINGS Brain: There is moderately advanced cerebral atrophy with atrophic ventriculomegaly and mild cerebellar atrophy. There is moderate to severe small vessel disease of the cerebral white matter. No asymmetry is seen concerning for an acute infarct, hemorrhage or mass. There is no midline shift. Basal cisterns are clear. Vascular: There are calcifications in the carotid siphons. No hyperdense central vessel is seen. Skull: Negative for fractures or focal lesions. 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 There is a small right occipitotemporal scalp hematoma which was larger previously. No new scalp hematoma is seen.  Sinuses/Orbits: Old lens replacements. Visualized sinuses, bilateral mastoid air cells are clear. Other: None. CT CERVICAL SPINE FINDINGS Alignment: Stable findings. Reversed lordosis again noted centered at C5 and 2 mm grade 1 anterolisthesis C2-3, C3-4 and C4-5, trace retrolisthesis C5-6 and C6-7 all likely degenerative etiology. There is no widening of the anterior atlantodental joint. There is spurring along the C1-2 lateral mass articulations. Spurring of the anterior atlantodental joint. Skull base and vertebrae: Osteopenia. No fracture or pathologic bone lesion is seen. Soft tissues and spinal canal: No prevertebral fluid or swelling. No visible canal hematoma. A small left dorsal epidural calcified meningioma is again noted at C4-5, but there is no associated mass effect. Disc levels: There is advanced degenerative disc collapse with bidirectional osteophytes at C5-6 and C6-7, posterior disc osteophyte complexes flattening of the ventral cord surface at these levels. Other levels show lesser degenerative disc space loss minimal endplate spurring without mass effect. There is multilevel facet joint and uncinate hypertrophy with facet joint ankylosis on the left at C3-4. Degenerative foraminal stenosis is greatest on the left at C2-3 and C3-4, and on both sides at C4-5, and on the left again at C5-6 and C6-7. Upper chest: Negative. Other: None. CT ABDOMEN AND PELVIS FINDINGS Lower chest: There are atelectatic bands in the lower lobes and in the elevated right hemidiaphragm. There is an irregular 8 mm noncalcified nodule medially in the right middle lobe on series 4 axial image 2. Remaining lung bases are clear. There are scattered calcifications in the aortic valve leaflets and coronary arteries, mild cardiomegaly. Hepatobiliary: Breathing motion limits evaluation. There is no obvious mass. There is a 1.4 cm stone in the gallbladder neck, which may be impacted but there is no gallbladder thickening or biliary  dilatation. Pancreas: No focal abnormality is seen through the breathing motion. Spleen: No focal abnormality is seen through the breathing motion. No splenomegaly. Adrenals/Urinary Tract: There is no adrenal mass. There is a wedge-shaped cortical scar laterally in the upper pole left kidney. There is a homogeneous 2.1 cm thin walled cyst in the upper pole of the right kidney or 14.1 Hounsfield units, 9 mm low-density lesion medial to this in the right upper pole which is too small to characterize. No imaging follow-up is recommended. Remainder of both kidneys are unremarkable. There is no urinary stone or obstruction. There is no bladder thickening. The ureters insert low in the pelvis indicating pelvic floor laxity but there is no cystocele. Stomach/Bowel: The stomach is contracted. The small bowel is normal in caliber. An appendix is not seen in this patient. There is moderate stool retention particularly in the  cecum and ascending colon, proximal transverse segment with left-sided diverticulosis without evidence of diverticulitis. Diverticulosis most advanced in the sigmoid segment. There is a small rectocele protruding just below the level of the pelvic floor. Vascular/Lymphatic: Aortic atherosclerosis. No enlarged abdominal or pelvic lymph nodes. Branch artery calcifications are noted, greatest in the renal artery ostia. Reproductive: Surgically absent uterus. The left ovary is unremarkable. The right ovary demonstrates a 5 cm thin walled homogeneous simple appearing cyst of 13.2 Hounsfield units. No adnexal solid mass is seen. Other: There is no free air, free hemorrhage or free fluid. There are small umbilical fat hernias. There is no incarcerated abdominal wall hernia. Multiple pelvic phleboliths. Musculoskeletal: There is osteopenia. There is a compression fracture of the T10 vertebral body, with transverse oblique linear fracture line through the inferior vertebral body, buckling along the anterior and  posterior cortex, trace retropulsion of the posteroinferior cortex and approximately 15% loss of the overall vertebral body height. There is no extension into the pedicles and posterior elements. There is no associated herniated disc. There are degenerative changes of the lumbar spine greatest at L4-5 and L5-S1. Mild hip DJD and chondrocalcinosis. IMPRESSION: 1. No acute intracranial CT findings or depressed skull fractures. Calvarial osteopenia. 2. Small right occipitotemporal scalp hematoma, smaller than 2 days ago. 3. Osteopenia, degenerative changes, reversed lordosis in the cervical spine without evidence of fractures or traumatic listhesis. 4. Lower plate transverse oblique compression fracture of the T10 vertebral body with up to 15% loss of the overall vertebral body height, with cortical buckling anteriorly and posteriorly and trace retropulsion. No spinal canal hematoma is seen. 5. 8 mm irregular nodule in the right middle lobe. The usual recommendation per Fleischner guidelines would be for 6-12 month follow-up CT to ensure stability, with additional CT depending on whether or not the patient is high risk. In this case, follow-up imaging should be considered in light of the patient's advanced age and likelihood of intervention if the nodule shows growth. 6. 5 cm simple cyst right ovary. Three-month follow-up ultrasound for reassessment/recharacterization is suggested, taking into account advanced age. 7. No other acute CT findings. Chronic changes including constipation and diverticulosis. 8. Renal cysts and scarring with heavy calcification in the renal artery ostia. 9. 1.4 cm gallstone appears impacted in the gallbladder neck but there is no gallbladder wall thickening. 10. Aortic atherosclerosis. 11. Small rectocele protruding below the level of the pelvic floor. Additional evidence of pelvic floor laxity with low pelvic ureteral insertions but no cystocele. Electronically Signed   By: Telford Nab  M.D.   On: 12/16/2021 21:36   CT ABDOMEN PELVIS W CONTRAST  Result Date: 12/16/2021 CLINICAL DATA:  [place] 3 days ago, with hyponatremia and confusion. Neck pain. EXAM: CT HEAD WITHOUT CONTRAST CT CERVICAL SPINE WITHOUT CONTRAST CT ABDOMEN AND PELVIS WITH CONTRAST TECHNIQUE: Contiguous axial images were obtained from the base of the skull through the vertex without intravenous contrast. Multidetector CT imaging of the cervical spine was performed without intravenous contrast. Multiplanar CT image reconstructions were also generated. Multidetector CT imaging of the abdomen and pelvis was performed using the standard protocol following bolus administration of intravenous contrast. RADIATION DOSE REDUCTION: This exam was performed according to the departmental dose-optimization program which includes automated exposure control, adjustment of the mA and/or kV according to patient size and/or use of iterative reconstruction technique. CONTRAST:  63m OMNIPAQUE IOHEXOL 300 MG/ML  SOLN COMPARISON:  Head CT and cervical spine CT both 12/14/2021. No prior abdomen pelvis CT. FINDINGS:  CT HEAD FINDINGS Brain: There is moderately advanced cerebral atrophy with atrophic ventriculomegaly and mild cerebellar atrophy. There is moderate to severe small vessel disease of the cerebral white matter. No asymmetry is seen concerning for an acute infarct, hemorrhage or mass. There is no midline shift. Basal cisterns are clear. Vascular: There are calcifications in the carotid siphons. No hyperdense central vessel is seen. Skull: Negative for fractures or focal lesions. 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 There is a small right occipitotemporal scalp hematoma which was larger previously. No new scalp hematoma is seen. Sinuses/Orbits: Old lens replacements. Visualized sinuses, bilateral mastoid air cells are clear. Other: None. CT CERVICAL SPINE FINDINGS Alignment: Stable findings. Reversed lordosis again noted centered at C5 and 2 mm grade 1  anterolisthesis C2-3, C3-4 and C4-5, trace retrolisthesis C5-6 and C6-7 all likely degenerative etiology. There is no widening of the anterior atlantodental joint. There is spurring along the C1-2 lateral mass articulations. Spurring of the anterior atlantodental joint. Skull base and vertebrae: Osteopenia. No fracture or pathologic bone lesion is seen. Soft tissues and spinal canal: No prevertebral fluid or swelling. No visible canal hematoma. A small left dorsal epidural calcified meningioma is again noted at C4-5, but there is no associated mass effect. Disc levels: There is advanced degenerative disc collapse with bidirectional osteophytes at C5-6 and C6-7, posterior disc osteophyte complexes flattening of the ventral cord surface at these levels. Other levels show lesser degenerative disc space loss minimal endplate spurring without mass effect. There is multilevel facet joint and uncinate hypertrophy with facet joint ankylosis on the left at C3-4. Degenerative foraminal stenosis is greatest on the left at C2-3 and C3-4, and on both sides at C4-5, and on the left again at C5-6 and C6-7. Upper chest: Negative. Other: None. CT ABDOMEN AND PELVIS FINDINGS Lower chest: There are atelectatic bands in the lower lobes and in the elevated right hemidiaphragm. There is an irregular 8 mm noncalcified nodule medially in the right middle lobe on series 4 axial image 2. Remaining lung bases are clear. There are scattered calcifications in the aortic valve leaflets and coronary arteries, mild cardiomegaly. Hepatobiliary: Breathing motion limits evaluation. There is no obvious mass. There is a 1.4 cm stone in the gallbladder neck, which may be impacted but there is no gallbladder thickening or biliary dilatation. Pancreas: No focal abnormality is seen through the breathing motion. Spleen: No focal abnormality is seen through the breathing motion. No splenomegaly. Adrenals/Urinary Tract: There is no adrenal mass. There is a  wedge-shaped cortical scar laterally in the upper pole left kidney. There is a homogeneous 2.1 cm thin walled cyst in the upper pole of the right kidney or 14.1 Hounsfield units, 9 mm low-density lesion medial to this in the right upper pole which is too small to characterize. No imaging follow-up is recommended. Remainder of both kidneys are unremarkable. There is no urinary stone or obstruction. There is no bladder thickening. The ureters insert low in the pelvis indicating pelvic floor laxity but there is no cystocele. Stomach/Bowel: The stomach is contracted. The small bowel is normal in caliber. An appendix is not seen in this patient. There is moderate stool retention particularly in the cecum and ascending colon, proximal transverse segment with left-sided diverticulosis without evidence of diverticulitis. Diverticulosis most advanced in the sigmoid segment. There is a small rectocele protruding just below the level of the pelvic floor. Vascular/Lymphatic: Aortic atherosclerosis. No enlarged abdominal or pelvic lymph nodes. Branch artery calcifications are noted, greatest in the renal artery  ostia. Reproductive: Surgically absent uterus. The left ovary is unremarkable. The right ovary demonstrates a 5 cm thin walled homogeneous simple appearing cyst of 13.2 Hounsfield units. No adnexal solid mass is seen. Other: There is no free air, free hemorrhage or free fluid. There are small umbilical fat hernias. There is no incarcerated abdominal wall hernia. Multiple pelvic phleboliths. Musculoskeletal: There is osteopenia. There is a compression fracture of the T10 vertebral body, with transverse oblique linear fracture line through the inferior vertebral body, buckling along the anterior and posterior cortex, trace retropulsion of the posteroinferior cortex and approximately 15% loss of the overall vertebral body height. There is no extension into the pedicles and posterior elements. There is no associated herniated  disc. There are degenerative changes of the lumbar spine greatest at L4-5 and L5-S1. Mild hip DJD and chondrocalcinosis. IMPRESSION: 1. No acute intracranial CT findings or depressed skull fractures. Calvarial osteopenia. 2. Small right occipitotemporal scalp hematoma, smaller than 2 days ago. 3. Osteopenia, degenerative changes, reversed lordosis in the cervical spine without evidence of fractures or traumatic listhesis. 4. Lower plate transverse oblique compression fracture of the T10 vertebral body with up to 15% loss of the overall vertebral body height, with cortical buckling anteriorly and posteriorly and trace retropulsion. No spinal canal hematoma is seen. 5. 8 mm irregular nodule in the right middle lobe. The usual recommendation per Fleischner guidelines would be for 6-12 month follow-up CT to ensure stability, with additional CT depending on whether or not the patient is high risk. In this case, follow-up imaging should be considered in light of the patient's advanced age and likelihood of intervention if the nodule shows growth. 6. 5 cm simple cyst right ovary. Three-month follow-up ultrasound for reassessment/recharacterization is suggested, taking into account advanced age. 7. No other acute CT findings. Chronic changes including constipation and diverticulosis. 8. Renal cysts and scarring with heavy calcification in the renal artery ostia. 9. 1.4 cm gallstone appears impacted in the gallbladder neck but there is no gallbladder wall thickening. 10. Aortic atherosclerosis. 11. Small rectocele protruding below the level of the pelvic floor. Additional evidence of pelvic floor laxity with low pelvic ureteral insertions but no cystocele. Electronically Signed   By: Telford Nab M.D.   On: 12/16/2021 21:36   CT L-SPINE NO CHARGE  Result Date: 12/16/2021 CLINICAL DATA:  Fall 3 days prior.  Hyponatremia. EXAM: CT LUMBAR SPINE WITHOUT CONTRAST TECHNIQUE: Multidetector CT imaging of the lumbar spine was  performed without intravenous contrast administration. Multiplanar CT image reconstructions were also generated. RADIATION DOSE REDUCTION: This exam was performed according to the departmental dose-optimization program which includes automated exposure control, adjustment of the mA and/or kV according to patient size and/or use of iterative reconstruction technique. COMPARISON:  Lumbar spine radiographs 12/14/2021 FINDINGS: Segmentation: There are 5 non-rib-bearing lumbar-type vertebral bodies. Alignment: No sagittal spondylolisthesis. Minimal right lateral listhesis of L4 on L5. No significant scoliotic curvature. Vertebrae: No lumbar vertebral body height loss. Please see contemporaneous CT thoracic spine report for evaluation of acute to subacute T10 vertebral body compression fracture. Severe left-greater-than-right L4-5 and severe right and moderate left L5-S1 disc space narrowing, endplate sclerosis, and peripheral osteophytosis. Paraspinal and other soft tissues: Moderate atherosclerotic calcifications within the abdominal aorta and iliac arteries. Tiny low density lesion within the partially visualized anterior right kidney measuring up to 11 x 9 mm, too small to further characterize but statistically most likely a cyst. No follow-up imaging is recommended. Minimally partially visualized distal colonic diverticulosis. Disc levels:  L1-2: Mild-to-moderate bilateral facet joint hypertrophy. No central canal or neuroforaminal stenosis. L2-3: Mild-to-moderate bilateral facet joint hypertrophy. Mild posterior endplate spurring. Mild left-greater-than-right neuroforaminal stenosis. Moderate narrowing of the lateral recesses and mild central canal stenosis. L3-4: Moderate bilateral facet joint hypertrophy. Mild broad-based posterior disc osteophyte complex. Borderline mild right neuroforaminal stenosis. Moderate to severe central canal stenosis. L4-5: Moderate bilateral facet joint hypertrophy. Mild broad-based  posterior disc osteophyte complex with moderate left intraforaminal endplate spurring. Moderate left and mild right neuroforaminal stenosis. Moderate narrowing of the lateral recesses and moderate central canal stenosis. L5-S1: Moderate bilateral facet joint hypertrophy. Minimal broad-based posterior disc osteophyte complex with moderate right intraforaminal endplate spurring. Mild-to-moderate right neuroforaminal stenosis. No central canal stenosis. IMPRESSION: 1. Please see contemporaneous CT of the thoracic spine for description of an acute to subacute inferior T10 vertebral body compression fracture. 2. No lumbar vertebral body acute fracture. 3. Multilevel degenerative disc and joint changes as above. Mild L2-3, moderate to severe L3-4, and moderate L4-5 central canal stenosis. 4. Multilevel neuroforaminal stenosis as above. The greatest levels include moderate left L4-5 and mild-to-moderate right L5-S1 neuroforaminal stenosis. Electronically Signed   By: Yvonne Kendall M.D.   On: 12/16/2021 21:32   CT Thoracic Spine Wo Contrast  Result Date: 12/16/2021 CLINICAL DATA:  Back trauma.  Fall 3 days prior.  Hyponatremia. EXAM: CT THORACIC SPINE WITHOUT CONTRAST TECHNIQUE: Multidetector CT images of the thoracic were obtained using the standard protocol without intravenous contrast. RADIATION DOSE REDUCTION: This exam was performed according to the departmental dose-optimization program which includes automated exposure control, adjustment of the mA and/or kV according to patient size and/or use of iterative reconstruction technique. COMPARISON:  Thoracic spine radiographs 12/14/2021 FINDINGS: Alignment: No sagittal spondylolisthesis. Vertebrae: There is minimal approximate 5% height loss of the mid T10 vertebral body. This was seen on recent 12/14/2021 radiographs. There is an acute to subacute horizontal linear fracture line extending from the anterior mid to inferior T10 vertebral body cortex through the  posteroinferior vertebral body cortex. Mild 3 mm anterior and 3 mm posterior T10 vertebral body cortical buckling (sagittal series 6, image 23). There is approximate 30% height loss of the mid to anterior T4 vertebral body, similar to recent 12/15/2021 radiographs. There is chronic sclerosis within the T4 superior endplate. No acute fracture line is seen. No significant retropulsion. Mild T3-4 through T6-7 disc space narrowing. Paraspinal and other soft tissues: Mild-to-moderate atherosclerotic calcifications within the partially visualized thoracic aorta. Mild curvilinear likely subsegmental atelectasis within the partially visualized medial aspect of the lungs. Coronary artery calcifications are noted. Disc levels: T10-11: Mild retropulsion of the posteroinferior T10 vertebral body. Mild bilateral facet joint hypertrophy. Moderate right and borderline mild left neuroforaminal stenosis. No central canal stenosis. IMPRESSION: 1. Acute to subacute T10 vertebral body compression fracture with minimal 5 mm height loss. There is mild posteroinferior T10 vertebral body retropulsion. Moderate right and borderline mild left T10-11 neuroforaminal stenosis. 2. Chronic appearing 30% height loss of the mid to anterior T4 vertebral body. Aortic Atherosclerosis (ICD10-I70.0). Electronically Signed   By: Yvonne Kendall M.D.   On: 12/16/2021 21:20            LOS: 1 day       Emeterio Reeve, DO [facility] 12/17/2021, 4:43 PM   Staff may message me via secure chat in [place]  but this may not receive immediate response,  please page for urgent matters!  If 7PM-7AM, please contact night-coverage [email or web address]  Dictation software was used to generate  the above note. Typos may occur and escape review, as with typed/written notes. Please contact Dr Sheppard Coil directly for clarity if needed.

## 2021-12-17 NOTE — ED Notes (Signed)
Pts heart rate noted to be in the 30's and 40's. Pts other vital stable. MD paged.

## 2021-12-17 NOTE — ED Notes (Addendum)
Pt with urine output noted. Pts family expresses concern over no bowel movement for 4 days. MD notified.

## 2021-12-17 NOTE — ED Notes (Signed)
MD paged about pts lack of any urine output this morning.

## 2021-12-17 NOTE — ED Notes (Signed)
EKG completed and MD notified. Pt sleeping on and off but easily aroused. Denies any complaints at this time.

## 2021-12-17 NOTE — ED Notes (Signed)
Pts purwic noted to be leaking. Pts brief, purwic, and chucks pad changed.

## 2021-12-17 NOTE — Consult Note (Signed)
Referring Physician:  No referring provider defined for this encounter.  Primary Physician:  Tracie Harrier, MD  History of Present Illness: 12/17/2021 [name] is here today with a chief complaint of back pain after admission to ER for hyponatremia, weakness, fatigue, and confusion.  She has no complaints other than back pain this morning. She is being admitted to the hospitalist service for medical reasons.  Review of Systems:  A 10 point review of systems is negative, except for the pertinent positives and negatives detailed in the HPI.  Past Medical History: History reviewed. No pertinent past medical history.  Past Surgical History: Past Surgical History:  Procedure Laterality Date   BREAST BIOPSY Left 2012   core - neg   BREAST BIOPSY Left 2013   excisional - neg   BREAST EXCISIONAL BIOPSY      Allergies: Allergies as of 12/16/2021 - Review Complete 12/16/2021  Allergen Reaction Noted   Nabumetone Swelling 08/17/2013   Clindamycin Other (See Comments) 01/08/2016    Medications: Current Meds  Medication Sig   aspirin 81 MG EC tablet Take 81 mg by mouth daily.   cephALEXin (KEFLEX) 250 MG capsule Take 1 capsule (250 mg total) by mouth 3 (three) times daily.   meloxicam (MOBIC) 7.5 MG tablet Take 7.5 mg by mouth daily as needed for pain.   methotrexate (RHEUMATREX) 2.5 MG tablet Take 10 mg by mouth once a week.   ondansetron (ZOFRAN-ODT) 4 MG disintegrating tablet Take 4 mg by mouth every 8 (eight) hours as needed. Take 1 tablet (4 mg total) by mouth every 8 (eight) hours as needed for Nausea for up to 7 days   Potassium Chloride ER 20 MEQ TBCR Take 20 mEq by mouth daily.    Social History: Social History   Tobacco Use   Smoking status: Never   Smokeless tobacco: Never  Substance Use Topics   Alcohol use: Never   Drug use: Never    Family Medical History: Family History  Problem Relation Age of Onset   Breast cancer Neg Hx      Physical Examination: Vitals:   12/17/21 0430 12/17/21 0530  BP: (!) 164/78 (!) 170/66  Pulse: 70 78  Resp: 20 (!) 23  Temp:  98.7 F (37.1 C)  SpO2: 96% 96%    General: Patient is well developed, well nourished, calm, collected, and in no apparent distress. Attention to examination is appropriate.  Neck:   Supple.  Full range of motion.  Respiratory: Patient is breathing without any difficulty.   NEUROLOGICAL:     Awake, alert, oriented.  Speech is clear and fluent. Fund of knowledge is appropriate. She is hard of hearing.  Cranial Nerves: Pupils equal round and reactive to light.  Facial tone is symmetric.  Facial sensation is symmetric. Shoulder shrug is symmetric. Tongue protrusion is midline.    Strength: Side Biceps Triceps Deltoid Interossei Grip Wrist Ext. Wrist Flex.  R '5 5 5 5 5 5 5  '$ L '5 5 5 5 5 5 5   '$ Side Iliopsoas Quads Hamstring PF DF EHL  R '5 5 5 5 5 5  '$ L '5 5 5 5 5 5   '$ Reflexes are 1+ and symmetric at the biceps, triceps, brachioradialis, patella and achilles.   Hoffman's is absent.   Bilateral upper and lower extremity sensation is intact to light touch.    No evidence of dysmetria noted.  Gait is untested   Medical Decision Making  Imaging: CT TL  spine 12/16/21 IMPRESSION: 1. Acute to subacute T10 vertebral body compression fracture with minimal 5 mm height loss. There is mild posteroinferior T10 vertebral body retropulsion. Moderate right and borderline mild left T10-11 neuroforaminal stenosis. 2. Chronic appearing 30% height loss of the mid to anterior T4 vertebral body.   Aortic Atherosclerosis (ICD10-I70.0).     Electronically Signed   By: Yvonne Kendall M.D.   On: 12/16/2021 21:20  I have personally reviewed the images and agree with the above interpretation.  Assessment and Plan: [name] is a pleasant 88 y.o. female with T10 compression fracture.  This has minimal height loss.  She has no neurological symptoms from this.  -  TLSO brace.  Ok to remove in bed - Wear brace when oob - OK to do PTOT - Will arrange outpatient follow up  Thank you for involving me in the care of this patient.      Dreyden Rohrman K. Izora Ribas MD, [facility] Neurosurgery

## 2021-12-17 NOTE — Hospital Course (Addendum)
Lisa Koch is a 88 y.o. female with medical history significant for HTN, seen in the ED on 10/01 with a fall with brief LOC, treated for UTI(culture with 10,000 urogenital flora) and discharged who was sent by her PCP for admission for hyponatremia.  Patient presented to her PCP for post ED follow-up and family was concerned about ongoing confusion, persistent nausea and persistent thoracic and lower back pain.  She had a CT scan on 10/one of her spine that showed chronic compression fractures.  They also noted that she had a slightly distended abdomen that she had not had a bowel movement in 3 days. 10/03: BP 171/67, pulse 60, respirations 21 with O2 sat 96% on room air and afebrile.  Labs significant for sodium 120, down from 132 on 10/01.  Bilirubin 3.1 up from 0.6 on 10/01.  WBC 13.5, about the same as 2 days ago in spite of treatment with Keflex for UTI.  Serum osmolality 252, urine osmolality 215. Trauma imaging including CT head, C-spine and abdomen and pelvis with mostly nonacute findings.  Notable findings included a T10 vertebral body compression fracture with 15% height loss, 1.4 cm gallstone impacted in the gallbladder neck without gallbladder wall thickening. Patient given sodium chloride bolus.  A TLSO brace was applied.  Hospitalist consulted for admission - admitted for acute metabolic encephalopathy secondary to hyponatremia, possibly postconcussive from fall, versus possible underlying infection.  Continued on home Keflex, urine recollected.  Continued on IV normal saline for hyponatremia.  Concern for cholelithiasis/elevated bilirubin, monitoring hepatic function and added on lipase.  Neurosurgery consulted: Maintain TLSO brace but okay to remove while in bed, PT/OT, arrange outpatient follow-up. 10/04: bradycardic into 30s-40s per RN early AM, EKG no concerns, sinus. HR improved on rounds. Continue treat pain and hyponatremia.  Caused NS infusion due to concern for rapid correction, sodium  was stable in the afternoon 128 10/05: Sodium on AM labs to 125, restarted NS --> 128 mid-day, recheck pending later this afternoon  10/06: Sodium to 131 then 134 today. Will trial reduce IV fluids and maintain on salt tabs.  10/07: Sodium stable at 134. D/c IV fluids. Remains stable in afternoon. Pt and family requesting DME for bedside commode, walker. Pt very anxious hasn't had BM in 2 days and feels constipated. Bowel regimen administered. If normal or stable sodium tomorrow will plan for discharge.  10/08: sodium dropping thru today. Minimal po intake d/t constipation, but also has been off NS. Have asked nephrology to give second opinion.  10/09: sodium stable, no concerns from nephrology, KUB no obstruction. Pt cleared for discharge.   Consultants:  Neurosurgery - no procedures planned Nephrology   Procedures: none      ASSESSMENT & PLAN:   Principal Problem:   Acute metabolic encephalopathy Active Problems:   Acute hyponatremia   Cholelithiasis   HTN (hypertension)   Recent fall with LOC 12/14/21   Elevated bilirubin   Back pain secondary to closed wedge compression fracture of tenth thoracic vertebra (HCC)  Acute hyponatremia - RESOLVED Hypotonic and suspect hypovolemic, also thiazide effect, poor por intake Follow outpatient  Acute metabolic encephalopathy - RESOLVED Secondary to acute hyponatremia, possible postconcussive from fall, unlikely underlying infection Mental status appears improved 12/17/21 alert/oriented   Cholelithiasis Elevated bilirubin Bilirubin elevated at 1.4 above 0.6 a couple days prior patient symptomatic for nausea.  WBC elevated.  LFTs normal CT shows 1.4 cm gallstone appears impacted in the gallbladder neck but there is no gallbladder wall thickening. Continue  to monitor hepatic function --> stable and WNL will add on lipase --> WNL  HTN (hypertension) Elevated BP with systolic in the 940H to 680S Continue lisinopril and verapamil DC  HCTZ given hyponatremia  Back pain secondary to closed wedge compression fracture of tenth thoracic vertebra (HCC) Continue TLSO brace while OOB Pain control PT/OT - no follow-up needed Outpatient follow-up with neurosurgery  Recent fall with LOC 12/14/21 Etiology of fall likely d/t hyponatremia/weakness.   Patient has improving occipitotemporal scalp hematoma without intracranial injury Fall precautions PT/OT eval - no follow-up needed  Neurologic checks can d/c  Nasal Congestion Family requesting something to help patient cough up "frog in her throat" DuoNeb x1 Saline nasal spry

## 2021-12-17 NOTE — Progress Notes (Signed)
Orthopedic Tech Progress Note Patient Details:  Lisa Koch [birth year] [id number]  Ortho Devices Type of Ortho Device: Thoracolumbar corset (TLSO) Ortho Device/Splint Location: BACK Ortho Device/Splint Interventions: Ordered      [name] L Talal Fritchman 12/17/2021, 12:29 AM

## 2021-12-17 NOTE — ED Notes (Signed)
Informed RN bed assigned 

## 2021-12-18 DIAGNOSIS — G9341 Metabolic encephalopathy: Secondary | ICD-10-CM | POA: Diagnosis not present

## 2021-12-18 LAB — BASIC METABOLIC PANEL
Anion gap: 9 (ref 5–15)
BUN: 14 mg/dL (ref 8–23)
CO2: 22 mmol/L (ref 22–32)
Calcium: 9.1 mg/dL (ref 8.9–10.3)
Chloride: 94 mmol/L — ABNORMAL LOW (ref 98–111)
Creatinine, Ser: 0.69 mg/dL (ref 0.44–1.00)
GFR, Estimated: >60 mL/min (ref >60)
Glucose, Bld: 136 mg/dL — ABNORMAL HIGH (ref 70–99)
Potassium: 4 mmol/L (ref 3.5–5.1)
Sodium: 125 mmol/L — ABNORMAL LOW (ref 135–145)

## 2021-12-18 LAB — SODIUM
Sodium: 128 mmol/L — ABNORMAL LOW (ref 135–145)
Sodium: 128 mmol/L — ABNORMAL LOW (ref 135–145)

## 2021-12-18 MED ORDER — SODIUM CHLORIDE 0.9 % IV SOLN: INTRAVENOUS | Status: DC

## 2021-12-18 NOTE — Evaluation (Signed)
Physical Therapy Evaluation Patient Details Name: Lisa Koch MRN: [id number] DOB: 08-Mar-1925 Today's Date: 12/18/2021  History of Present Illness  Demecia TRENACE COUGHLIN is a 88 y.o. female with medical history significant for HTN, seen in the ED on 10/01 with a fall with brief LOC, treated for UTI(culture with 10,000 urogenital flora) and discharged who was sent by her PCP for admission for hyponatremia.    Clinical Impression  Pt received seated in recliner upon arrival to room and pt agreeable to therapy.  Pt enthusiastic about beginning therapy and is ready to ambulate.  Pt performs well with all aspects of mobility and CGA only applied as safety measure for first time working with pt.  Pt able to tolerate standing while TLSO brace was donned for ambulation attempt.  Pt ambulates well with RW and is able to mobilize around the nursing station and back to room without any complications.  Pt returned to recliner and TLSO brace doffed at this time per request and not being necessary per MD orders.  D/c options presented to pt and DIL and everyone in agreement with pt returning to live with son and DIL.  Pt requires no PT follow-up, but will be kept on PT caseload for advancement of gait and furthe instruction of brace as necessary.       Recommendations for follow up therapy are one component of a multi-disciplinary discharge planning process, led by the attending physician.  Recommendations may be updated based on patient status, additional functional criteria and insurance authorization.  Follow Up Recommendations No PT follow up      Assistance Recommended at Discharge PRN  Patient can return home with the following  A little help with walking and/or transfers;A little help with bathing/dressing/bathroom;Assist for transportation;Help with stairs or ramp for entrance    Equipment Recommendations None recommended by PT  Recommendations for Other Services       Functional Status Assessment  Patient has had a recent decline in their functional status and demonstrates the ability to make significant improvements in function in a reasonable and predictable amount of time.     Precautions / Restrictions Precautions Precautions: Fall;Back Required Braces or Orthoses: Spinal Brace Spinal Brace: Thoracolumbosacral orthotic;Applied in sitting position Restrictions Weight Bearing Restrictions: No      Mobility  Bed Mobility               General bed mobility comments: recieved and left in chair    Transfers Overall transfer level: Needs assistance Equipment used: Rolling walker (2 wheels) Transfers: Sit to/from Stand Sit to Stand: Min guard           General transfer comment: pt able to transfer into standing without any physical assistance from therapist, CGA applied for safety purposes only.    Ambulation/Gait Ambulation/Gait assistance: Min guard Gait Distance (Feet): 160 Feet Assistive device: Rolling walker (2 wheels) Gait Pattern/deviations: Step-through pattern Gait velocity: decreased     General Gait Details: pt wiht slowed gait pattern, but safe with ambulation.  Stairs            Wheelchair Mobility    Modified Rankin (Stroke Patients Only)       Balance Overall balance assessment: Needs assistance Sitting-balance support: No upper extremity supported, Feet supported Sitting balance-Leahy Scale: Good     Standing balance support: Single extremity supported, During functional activity Standing balance-Leahy Scale: Fair  Pertinent Vitals/Pain Pain Assessment Pain Assessment: Faces Faces Pain Scale: Hurts a little bit Pain Location: low back Pain Descriptors / Indicators: Discomfort, Dull, Grimacing Pain Intervention(s): Limited activity within patient's tolerance, Monitored during session, Repositioned    Home Living Family/patient expects to be discharged to:: Private  residence Living Arrangements: Alone Available Help at Discharge: Family;Available 24 hours/day Type of Home: House Home Access: Stairs to enter Entrance Stairs-Rails: Left Entrance Stairs-Number of Steps: 3   Home Layout: One level Home Equipment: Rollator (4 wheels);Cane - single point      Prior Function Prior Level of Function : Independent/Modified Independent;Driving             Mobility Comments: no AD use       Hand Dominance   Dominant Hand: Right    Extremity/Trunk Assessment   Upper Extremity Assessment Upper Extremity Assessment: Overall WFL for tasks assessed    Lower Extremity Assessment Lower Extremity Assessment: Generalized weakness       Communication   Communication: HOH  Cognition Arousal/Alertness: Awake/alert Behavior During Therapy: WFL for tasks assessed/performed Overall Cognitive Status: Within Functional Limits for tasks assessed                                          General Comments      Exercises     Assessment/Plan    PT Assessment Patient needs continued PT services  PT Problem List Decreased strength;Decreased activity tolerance;Decreased balance;Decreased mobility       PT Treatment Interventions DME instruction;Gait training;Functional mobility training;Therapeutic activities;Therapeutic exercise;Balance training;Stair training;Neuromuscular re-education    PT Goals (Current goals can be found in the Care Plan section)  Acute Rehab PT Goals Patient Stated Goal: to get better and go home. PT Goal Formulation: With patient Time For Goal Achievement: 01/01/22 Potential to Achieve Goals: Good    Frequency Min 2X/week     Co-evaluation               AM-PAC PT "6 Clicks" Mobility  Outcome Measure Help needed turning from your back to your side while in a flat bed without using bedrails?: A Little Help needed moving from lying on your back to sitting on the side of a flat bed without  using bedrails?: A Little Help needed moving to and from a bed to a chair (including a wheelchair)?: A Little Help needed standing up from a chair using your arms (e.g., wheelchair or bedside chair)?: A Little Help needed to walk in hospital room?: A Little Help needed climbing 3-5 steps with a railing? : A Little 6 Click Score: 18    End of Session Equipment Utilized During Treatment: Gait belt Activity Tolerance: Patient tolerated treatment well Patient left: in chair;with call bell/phone within reach;with chair alarm set;with family/visitor present Nurse Communication: Mobility status PT Visit Diagnosis: Unsteadiness on feet (R26.81);Other abnormalities of gait and mobility (R26.89);Muscle weakness (generalized) (M62.81);History of falling (Z91.81);Difficulty in walking, not elsewhere classified (R26.2);Pain Pain - part of body:  (lumbar spine)    Time: 7048-8891 PT Time Calculation (min) (ACUTE ONLY): 24 min   Charges:   PT Evaluation $PT Eval Low Complexity: 1 Low         Gwenlyn Saran, PT, DPT 12/18/21, 5:15 PM

## 2021-12-18 NOTE — Progress Notes (Signed)
PROGRESS NOTE    HAIDY KACKLEY   YHC:[id number] DOB: 05-18-1924  DOA: 12/16/2021 Date of Service: 12/18/21 PCP: Tracie Harrier, MD     Brief Narrative / Hospital Course:  MAKYIAH LIE is a 88 y.o. female with medical history significant for HTN, seen in the ED on 10/01 with a fall with brief LOC, treated for UTI(culture with 10,000 urogenital flora) and discharged who was sent by her PCP for admission for hyponatremia.  Patient presented to her PCP for post ED follow-up and family was concerned about ongoing confusion, persistent nausea and persistent thoracic and lower back pain.  She had a CT scan on 10/one of her spine that showed chronic compression fractures.  They also noted that she had a slightly distended abdomen that she had not had a bowel movement in 3 days. 10/03: BP 171/67, pulse 60, respirations 21 with O2 sat 96% on room air and afebrile.  Labs significant for sodium 128, down from 132 on 10/01.  Bilirubin 3.1 up from 0.6 on 10/01.  WBC 13.5, about the same as 2 days ago in spite of treatment with Keflex for UTI.  Serum osmolality 252, urine osmolality 215. Trauma imaging including CT head, C-spine and abdomen and pelvis with mostly nonacute findings.  Notable findings included a T10 vertebral body compression fracture with 15% height loss, 1.4 cm gallstone impacted in the gallbladder neck without gallbladder wall thickening. Patient given sodium chloride bolus.  A TLSO brace was applied.  Hospitalist consulted for admission - admitted for acute metabolic encephalopathy secondary to hyponatremia, possibly postconcussive from fall, versus possible underlying infection.  Continued on home Keflex, urine recollected.  Continued on IV normal saline for hyponatremia.  Concern for cholelithiasis/elevated bilirubin, monitoring hepatic function and added on lipase.  Neurosurgery consulted: Maintain TLSO brace but okay to remove while in bed, PT/OT, arrange outpatient follow-up. 10/04:  bradycardic into 30s-40s per RN early AM, EKG no concerns, sinus. HR improved on rounds. Continue treat pain and hyponatremia.  Caused NS infusion due to concern for rapid correction, sodium was stable in the afternoon 128 10/05: Sodium on AM labs to 125, restarted NS --> 128 mid-day, recheck pending later this afternoon   Consultants:  Neurosurgery - no procedures planned  Procedures: none      ASSESSMENT & PLAN:   Principal Problem:   Acute metabolic encephalopathy Active Problems:   Acute hyponatremia   Cholelithiasis   HTN (hypertension)   Recent fall with LOC 12/14/21   Elevated bilirubin   Back pain secondary to closed wedge compression fracture of tenth thoracic vertebra (HCC)  Acute hyponatremia Hypotonic and suspect hypovolemic, also thiazide effect  IV NS bolus and continuous fluids after that have been done  D/c home HCTZ Serum osmolality 252, urine osmolality 215 and urine sodium 12 Continue IV NS --> discontinued afternoon 12/17/2021 due to sodium going from 120-128 in less than 24 hours, will recehck in PM --> stable at 128 --> AM 10/05 down to 125, resume NS --> 128 mid-day, recheck pending later this afternoon   Cholelithiasis Elevated bilirubin Bilirubin elevated at 1.4 above 0.6 a couple days prior patient symptomatic for nausea.  WBC elevated.  LFTs normal CT shows 1.4 cm gallstone appears impacted in the gallbladder neck but there is no gallbladder wall thickening. Continue to monitor hepatic function --> stable and WNL will add on lipase --> WNL  Acute metabolic encephalopathy Secondary to acute hyponatremia, possible postconcussive from fall, possible underlying infection Patient had urinalysis consistent with  UTI but urine culture showed multiple species with recommendation for recollection Mental status appears improved today 12/17/21 alert/oriented  Neurologic checks, fall precautions Urine recollected Continue home Keflex  HTN  (hypertension) Elevated BP with systolic in the 643P to 295J Continue lisinopril and verapamil DC HCTZ given hyponatremia  Back pain secondary to closed wedge compression fracture of tenth thoracic vertebra (HCC) Continue TLSO brace while OOB Pain control PT/OT Outpatient follow-up with neurosurgery  Recent fall with LOC 12/14/21 Etiology of fall uncertain.  Patient has improving occipitotemporal scalp hematoma without intracranial injury Fall precautions PT/OT eval Neurologic checks      DVT prophylaxis: lovenox Pertinent IV fluids/nutrition: NS treating hyponatremia Central lines / invasive devices: none  Code Status: DNR Family Communication: daughter in law at bedside on rounds   Disposition: inpatient TOC needs: may need HH, family states would not want SNF, PT/OT to eval.  Barriers to discharge / significant pending items: hyponatremia             Subjective:  Patient reports pain in back , inquires about due for pain meds. Otherwise feeling weak and tired but overall better than when she came in        Objective:  Vitals:   12/17/21 1612 12/17/21 1950 12/18/21 0526 12/18/21 0755  BP: (!) 156/75 133/64 (!) 174/75 (!) 147/64  Pulse: 76 72 63 95  Resp: '16 16 16 16  '$ Temp: 99 F (37.2 C) 98.1 F (36.7 C) 98.1 F (36.7 C) 98 F (36.7 C)  TempSrc:      SpO2: 96% 96% 97% 94%  Weight:      Height:        Intake/Output Summary (Last 24 hours) at 12/18/2021 1321 Last data filed at 12/17/2021 1503 Gross per 24 hour  Intake 993.28 ml  Output --  Net 993.28 ml   Filed Weights   12/16/21 1944  Weight: 60 kg    Examination:  Constitutional:  VS as above General Appearance: alert, NAD. Very hard of hearing.  Ears, Nose, Mouth, Throat: Normal external appearance MMM Neck: No masses, trachea midline Respiratory: - exam limited d/t brace Normal respiratory effort No wheeze No rhonchi No rales Cardiovascular: S1/S2 normal RRR No  murmur No rub/gallop auscultated Gastrointestinal: No tenderness Musculoskeletal:  No clubbing/cyanosis of digits Symmetrical movement in all extremities Neurological: No cranial nerve deficit on limited exam Alert, oriented Psychiatric: Normal judgment/insight Pleasant mood and affect       Scheduled Medications:   aspirin EC  81 mg Oral Daily   enoxaparin (LOVENOX) injection  40 mg Subcutaneous Q24H   lisinopril  40 mg Oral Daily   [START ON 12/22/2021] methotrexate  10 mg Oral Weekly   pantoprazole  40 mg Oral Daily   potassium chloride  20 mEq Oral q1800    Continuous Infusions:  sodium chloride 100 mL/hr at 12/18/21 0917     PRN Medications:  acetaminophen **OR** acetaminophen, HYDROcodone-acetaminophen, ondansetron **OR** ondansetron (ZOFRAN) IV  Antimicrobials:  Anti-infectives (From admission, onward)    None       Data Reviewed: I have personally reviewed following labs and imaging studies  CBC: Recent Labs  Lab 12/14/21 0022 12/16/21 1953 12/17/21 0400  WBC 13.9* 13.5* 9.5  NEUTROABS 11.1* 11.1*  --   HGB 12.7 12.2 11.7*  HCT 37.7 34.8* 33.1*  MCV 96.9 93.3 93.8  PLT 200 196 884   Basic Metabolic Panel: Recent Labs  Lab 12/14/21 0022 12/16/21 1953 12/17/21 0400 12/17/21 1049 12/17/21 1849 12/18/21 0523  12/18/21 1155  NA 132* 120* 125* 128* 128* 125* 128*  K 4.2 4.3 3.9 3.7  --  4.0  --   CL 100 85* 93* 97*  --  94*  --   CO2 '24 24 26 24  '$ --  22  --   GLUCOSE 237* 106* 95 146*  --  136*  --   BUN 28* '17 14 13  '$ --  14  --   CREATININE 1.09* 0.68 0.68 0.68  --  0.69  --   CALCIUM 9.3 10.1 9.3 9.0  --  9.1  --    GFR: Estimated Creatinine Clearance: 34.1 mL/min (by C-G formula based on SCr of 0.69 mg/dL). Liver Function Tests: Recent Labs  Lab 12/14/21 0022 12/16/21 1953 12/17/21 0400  AST '28 27 24  '$ ALT '25 27 25  '$ ALKPHOS 68 53 50  BILITOT 0.6 1.3* 1.3*  PROT 6.2* 6.0* 5.5*  ALBUMIN 3.8 3.5 3.4*   Recent Labs  Lab  12/14/21 0022 12/17/21 0400  LIPASE 47 34   No results for input(s): "AMMONIA" in the last 168 hours. Coagulation Profile: No results for input(s): "INR", "PROTIME" in the last 168 hours. Cardiac Enzymes: No results for input(s): "CKTOTAL", "CKMB", "CKMBINDEX", "TROPONINI" in the last 168 hours. BNP (last 3 results) No results for input(s): "PROBNP" in the last 8760 hours. HbA1C: No results for input(s): "HGBA1C" in the last 72 hours. CBG: No results for input(s): "GLUCAP" in the last 168 hours. Lipid Profile: No results for input(s): "CHOL", "HDL", "LDLCALC", "TRIG", "CHOLHDL", "LDLDIRECT" in the last 72 hours. Thyroid Function Tests: No results for input(s): "TSH", "T4TOTAL", "FREET4", "T3FREE", "THYROIDAB" in the last 72 hours. Anemia Panel: No results for input(s): "VITAMINB12", "FOLATE", "FERRITIN", "TIBC", "IRON", "RETICCTPCT" in the last 72 hours. Urine analysis:    Component Value Date/Time   COLORURINE STRAW (A) 12/16/2021 2349   APPEARANCEUR CLEAR (A) 12/16/2021 2349   LABSPEC 1.013 12/16/2021 2349   PHURINE 6.0 12/16/2021 2349   GLUCOSEU NEGATIVE 12/16/2021 2349   HGBUR NEGATIVE 12/16/2021 2349   BILIRUBINUR NEGATIVE 12/16/2021 2349   KETONESUR 20 (A) 12/16/2021 2349   PROTEINUR NEGATIVE 12/16/2021 2349   NITRITE NEGATIVE 12/16/2021 2349   LEUKOCYTESUR NEGATIVE 12/16/2021 2349   Sepsis Labs: '@LABRCNTIP'$ (procalcitonin:4,lacticidven:4)  Recent Results (from the past 240 hour(s))  Urine Culture     Status: Abnormal   Collection Time: 12/14/21  1:40 AM   Specimen: Urine, Clean Catch  Result Value Ref Range Status   Specimen Description   Final    URINE, CLEAN CATCH Performed at [facility], [address], [place], [place] [address]    Special Requests   Final    NONE Performed at [facility], [address], [place], [place] [address]    Culture (A)  Final    10,000 COLONIES/mL MULTIPLE SPECIES PRESENT, SUGGEST RECOLLECTION   Report  Status 12/15/2021 FINAL  Final  Blood culture (single)     Status: None (Preliminary result)   Collection Time: 12/16/21  7:54 PM   Specimen: BLOOD  Result Value Ref Range Status   Specimen Description BLOOD LEFT FOREARM  Final   Special Requests   Final    BOTTLES DRAWN AEROBIC AND ANAEROBIC Blood Culture results may not be optimal due to an inadequate volume of blood received in culture bottles   Culture   Final    NO GROWTH 2 DAYS Performed at [facility], [address] [place], [place] [address]    Report Status  PENDING  Incomplete         Radiology Studies: CT HEAD WO CONTRAST (5MM)  Result Date: 12/16/2021 CLINICAL DATA:  [place] 3 days ago, with hyponatremia and confusion. Neck pain. EXAM: CT HEAD WITHOUT CONTRAST CT CERVICAL SPINE WITHOUT CONTRAST CT ABDOMEN AND PELVIS WITH CONTRAST TECHNIQUE: Contiguous axial images were obtained from the base of the skull through the vertex without intravenous contrast. Multidetector CT imaging of the cervical spine was performed without intravenous contrast. Multiplanar CT image reconstructions were also generated. Multidetector CT imaging of the abdomen and pelvis was performed using the standard protocol following bolus administration of intravenous contrast. RADIATION DOSE REDUCTION: This exam was performed according to the departmental dose-optimization program which includes automated exposure control, adjustment of the mA and/or kV according to patient size and/or use of iterative reconstruction technique. CONTRAST:  10m OMNIPAQUE IOHEXOL 300 MG/ML  SOLN COMPARISON:  Head CT and cervical spine CT both 12/14/2021. No prior abdomen pelvis CT. FINDINGS: CT HEAD FINDINGS Brain: There is moderately advanced cerebral atrophy with atrophic ventriculomegaly and mild cerebellar atrophy. There is moderate to severe small vessel disease of the cerebral white matter. No asymmetry is seen concerning for an acute infarct, hemorrhage or mass. There  is no midline shift. Basal cisterns are clear. Vascular: There are calcifications in the carotid siphons. No hyperdense central vessel is seen. Skull: Negative for fractures or focal lesions. 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 There is a small right occipitotemporal scalp hematoma which was larger previously. No new scalp hematoma is seen. Sinuses/Orbits: Old lens replacements. Visualized sinuses, bilateral mastoid air cells are clear. Other: None. CT CERVICAL SPINE FINDINGS Alignment: Stable findings. Reversed lordosis again noted centered at C5 and 2 mm grade 1 anterolisthesis C2-3, C3-4 and C4-5, trace retrolisthesis C5-6 and C6-7 all likely degenerative etiology. There is no widening of the anterior atlantodental joint. There is spurring along the C1-2 lateral mass articulations. Spurring of the anterior atlantodental joint. Skull base and vertebrae: Osteopenia. No fracture or pathologic bone lesion is seen. Soft tissues and spinal canal: No prevertebral fluid or swelling. No visible canal hematoma. A small left dorsal epidural calcified meningioma is again noted at C4-5, but there is no associated mass effect. Disc levels: There is advanced degenerative disc collapse with bidirectional osteophytes at C5-6 and C6-7, posterior disc osteophyte complexes flattening of the ventral cord surface at these levels. Other levels show lesser degenerative disc space loss minimal endplate spurring without mass effect. There is multilevel facet joint and uncinate hypertrophy with facet joint ankylosis on the left at C3-4. Degenerative foraminal stenosis is greatest on the left at C2-3 and C3-4, and on both sides at C4-5, and on the left again at C5-6 and C6-7. Upper chest: Negative. Other: None. CT ABDOMEN AND PELVIS FINDINGS Lower chest: There are atelectatic bands in the lower lobes and in the elevated right hemidiaphragm. There is an irregular 8 mm noncalcified nodule medially in the right middle lobe on series 4 axial image  2. Remaining lung bases are clear. There are scattered calcifications in the aortic valve leaflets and coronary arteries, mild cardiomegaly. Hepatobiliary: Breathing motion limits evaluation. There is no obvious mass. There is a 1.4 cm stone in the gallbladder neck, which may be impacted but there is no gallbladder thickening or biliary dilatation. Pancreas: No focal abnormality is seen through the breathing motion. Spleen: No focal abnormality is seen through the breathing motion. No splenomegaly. Adrenals/Urinary Tract: There is no adrenal mass. There is a wedge-shaped cortical scar laterally  in the upper pole left kidney. There is a homogeneous 2.1 cm thin walled cyst in the upper pole of the right kidney or 14.1 Hounsfield units, 9 mm low-density lesion medial to this in the right upper pole which is too small to characterize. No imaging follow-up is recommended. Remainder of both kidneys are unremarkable. There is no urinary stone or obstruction. There is no bladder thickening. The ureters insert low in the pelvis indicating pelvic floor laxity but there is no cystocele. Stomach/Bowel: The stomach is contracted. The small bowel is normal in caliber. An appendix is not seen in this patient. There is moderate stool retention particularly in the cecum and ascending colon, proximal transverse segment with left-sided diverticulosis without evidence of diverticulitis. Diverticulosis most advanced in the sigmoid segment. There is a small rectocele protruding just below the level of the pelvic floor. Vascular/Lymphatic: Aortic atherosclerosis. No enlarged abdominal or pelvic lymph nodes. Branch artery calcifications are noted, greatest in the renal artery ostia. Reproductive: Surgically absent uterus. The left ovary is unremarkable. The right ovary demonstrates a 5 cm thin walled homogeneous simple appearing cyst of 13.2 Hounsfield units. No adnexal solid mass is seen. Other: There is no free air, free hemorrhage or  free fluid. There are small umbilical fat hernias. There is no incarcerated abdominal wall hernia. Multiple pelvic phleboliths. Musculoskeletal: There is osteopenia. There is a compression fracture of the T10 vertebral body, with transverse oblique linear fracture line through the inferior vertebral body, buckling along the anterior and posterior cortex, trace retropulsion of the posteroinferior cortex and approximately 15% loss of the overall vertebral body height. There is no extension into the pedicles and posterior elements. There is no associated herniated disc. There are degenerative changes of the lumbar spine greatest at L4-5 and L5-S1. Mild hip DJD and chondrocalcinosis. IMPRESSION: 1. No acute intracranial CT findings or depressed skull fractures. Calvarial osteopenia. 2. Small right occipitotemporal scalp hematoma, smaller than 2 days ago. 3. Osteopenia, degenerative changes, reversed lordosis in the cervical spine without evidence of fractures or traumatic listhesis. 4. Lower plate transverse oblique compression fracture of the T10 vertebral body with up to 15% loss of the overall vertebral body height, with cortical buckling anteriorly and posteriorly and trace retropulsion. No spinal canal hematoma is seen. 5. 8 mm irregular nodule in the right middle lobe. The usual recommendation per Fleischner guidelines would be for 6-12 month follow-up CT to ensure stability, with additional CT depending on whether or not the patient is high risk. In this case, follow-up imaging should be considered in light of the patient's advanced age and likelihood of intervention if the nodule shows growth. 6. 5 cm simple cyst right ovary. Three-month follow-up ultrasound for reassessment/recharacterization is suggested, taking into account advanced age. 7. No other acute CT findings. Chronic changes including constipation and diverticulosis. 8. Renal cysts and scarring with heavy calcification in the renal artery ostia. 9.  1.4 cm gallstone appears impacted in the gallbladder neck but there is no gallbladder wall thickening. 10. Aortic atherosclerosis. 11. Small rectocele protruding below the level of the pelvic floor. Additional evidence of pelvic floor laxity with low pelvic ureteral insertions but no cystocele. Electronically Signed   By: Telford Nab M.D.   On: 12/16/2021 21:36   CT Cervical Spine Wo Contrast  Result Date: 12/16/2021 CLINICAL DATA:  [place] 3 days ago, with hyponatremia and confusion. Neck pain. EXAM: CT HEAD WITHOUT CONTRAST CT CERVICAL SPINE WITHOUT CONTRAST CT ABDOMEN AND PELVIS WITH CONTRAST TECHNIQUE: Contiguous axial images  were obtained from the base of the skull through the vertex without intravenous contrast. Multidetector CT imaging of the cervical spine was performed without intravenous contrast. Multiplanar CT image reconstructions were also generated. Multidetector CT imaging of the abdomen and pelvis was performed using the standard protocol following bolus administration of intravenous contrast. RADIATION DOSE REDUCTION: This exam was performed according to the departmental dose-optimization program which includes automated exposure control, adjustment of the mA and/or kV according to patient size and/or use of iterative reconstruction technique. CONTRAST:  16m OMNIPAQUE IOHEXOL 300 MG/ML  SOLN COMPARISON:  Head CT and cervical spine CT both 12/14/2021. No prior abdomen pelvis CT. FINDINGS: CT HEAD FINDINGS Brain: There is moderately advanced cerebral atrophy with atrophic ventriculomegaly and mild cerebellar atrophy. There is moderate to severe small vessel disease of the cerebral white matter. No asymmetry is seen concerning for an acute infarct, hemorrhage or mass. There is no midline shift. Basal cisterns are clear. Vascular: There are calcifications in the carotid siphons. No hyperdense central vessel is seen. Skull: Negative for fractures or focal lesions. 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  There is a small right occipitotemporal scalp hematoma which was larger previously. No new scalp hematoma is seen. Sinuses/Orbits: Old lens replacements. Visualized sinuses, bilateral mastoid air cells are clear. Other: None. CT CERVICAL SPINE FINDINGS Alignment: Stable findings. Reversed lordosis again noted centered at C5 and 2 mm grade 1 anterolisthesis C2-3, C3-4 and C4-5, trace retrolisthesis C5-6 and C6-7 all likely degenerative etiology. There is no widening of the anterior atlantodental joint. There is spurring along the C1-2 lateral mass articulations. Spurring of the anterior atlantodental joint. Skull base and vertebrae: Osteopenia. No fracture or pathologic bone lesion is seen. Soft tissues and spinal canal: No prevertebral fluid or swelling. No visible canal hematoma. A small left dorsal epidural calcified meningioma is again noted at C4-5, but there is no associated mass effect. Disc levels: There is advanced degenerative disc collapse with bidirectional osteophytes at C5-6 and C6-7, posterior disc osteophyte complexes flattening of the ventral cord surface at these levels. Other levels show lesser degenerative disc space loss minimal endplate spurring without mass effect. There is multilevel facet joint and uncinate hypertrophy with facet joint ankylosis on the left at C3-4. Degenerative foraminal stenosis is greatest on the left at C2-3 and C3-4, and on both sides at C4-5, and on the left again at C5-6 and C6-7. Upper chest: Negative. Other: None. CT ABDOMEN AND PELVIS FINDINGS Lower chest: There are atelectatic bands in the lower lobes and in the elevated right hemidiaphragm. There is an irregular 8 mm noncalcified nodule medially in the right middle lobe on series 4 axial image 2. Remaining lung bases are clear. There are scattered calcifications in the aortic valve leaflets and coronary arteries, mild cardiomegaly. Hepatobiliary: Breathing motion limits evaluation. There is no obvious mass. There  is a 1.4 cm stone in the gallbladder neck, which may be impacted but there is no gallbladder thickening or biliary dilatation. Pancreas: No focal abnormality is seen through the breathing motion. Spleen: No focal abnormality is seen through the breathing motion. No splenomegaly. Adrenals/Urinary Tract: There is no adrenal mass. There is a wedge-shaped cortical scar laterally in the upper pole left kidney. There is a homogeneous 2.1 cm thin walled cyst in the upper pole of the right kidney or 14.1 Hounsfield units, 9 mm low-density lesion medial to this in the right upper pole which is too small to characterize. No imaging follow-up is recommended. Remainder of both kidneys are  unremarkable. There is no urinary stone or obstruction. There is no bladder thickening. The ureters insert low in the pelvis indicating pelvic floor laxity but there is no cystocele. Stomach/Bowel: The stomach is contracted. The small bowel is normal in caliber. An appendix is not seen in this patient. There is moderate stool retention particularly in the cecum and ascending colon, proximal transverse segment with left-sided diverticulosis without evidence of diverticulitis. Diverticulosis most advanced in the sigmoid segment. There is a small rectocele protruding just below the level of the pelvic floor. Vascular/Lymphatic: Aortic atherosclerosis. No enlarged abdominal or pelvic lymph nodes. Branch artery calcifications are noted, greatest in the renal artery ostia. Reproductive: Surgically absent uterus. The left ovary is unremarkable. The right ovary demonstrates a 5 cm thin walled homogeneous simple appearing cyst of 13.2 Hounsfield units. No adnexal solid mass is seen. Other: There is no free air, free hemorrhage or free fluid. There are small umbilical fat hernias. There is no incarcerated abdominal wall hernia. Multiple pelvic phleboliths. Musculoskeletal: There is osteopenia. There is a compression fracture of the T10 vertebral body,  with transverse oblique linear fracture line through the inferior vertebral body, buckling along the anterior and posterior cortex, trace retropulsion of the posteroinferior cortex and approximately 15% loss of the overall vertebral body height. There is no extension into the pedicles and posterior elements. There is no associated herniated disc. There are degenerative changes of the lumbar spine greatest at L4-5 and L5-S1. Mild hip DJD and chondrocalcinosis. IMPRESSION: 1. No acute intracranial CT findings or depressed skull fractures. Calvarial osteopenia. 2. Small right occipitotemporal scalp hematoma, smaller than 2 days ago. 3. Osteopenia, degenerative changes, reversed lordosis in the cervical spine without evidence of fractures or traumatic listhesis. 4. Lower plate transverse oblique compression fracture of the T10 vertebral body with up to 15% loss of the overall vertebral body height, with cortical buckling anteriorly and posteriorly and trace retropulsion. No spinal canal hematoma is seen. 5. 8 mm irregular nodule in the right middle lobe. The usual recommendation per Fleischner guidelines would be for 6-12 month follow-up CT to ensure stability, with additional CT depending on whether or not the patient is high risk. In this case, follow-up imaging should be considered in light of the patient's advanced age and likelihood of intervention if the nodule shows growth. 6. 5 cm simple cyst right ovary. Three-month follow-up ultrasound for reassessment/recharacterization is suggested, taking into account advanced age. 7. No other acute CT findings. Chronic changes including constipation and diverticulosis. 8. Renal cysts and scarring with heavy calcification in the renal artery ostia. 9. 1.4 cm gallstone appears impacted in the gallbladder neck but there is no gallbladder wall thickening. 10. Aortic atherosclerosis. 11. Small rectocele protruding below the level of the pelvic floor. Additional evidence of  pelvic floor laxity with low pelvic ureteral insertions but no cystocele. Electronically Signed   By: Telford Nab M.D.   On: 12/16/2021 21:36   CT ABDOMEN PELVIS W CONTRAST  Result Date: 12/16/2021 CLINICAL DATA:  [place] 3 days ago, with hyponatremia and confusion. Neck pain. EXAM: CT HEAD WITHOUT CONTRAST CT CERVICAL SPINE WITHOUT CONTRAST CT ABDOMEN AND PELVIS WITH CONTRAST TECHNIQUE: Contiguous axial images were obtained from the base of the skull through the vertex without intravenous contrast. Multidetector CT imaging of the cervical spine was performed without intravenous contrast. Multiplanar CT image reconstructions were also generated. Multidetector CT imaging of the abdomen and pelvis was performed using the standard protocol following bolus administration of intravenous contrast. RADIATION DOSE  REDUCTION: This exam was performed according to the departmental dose-optimization program which includes automated exposure control, adjustment of the mA and/or kV according to patient size and/or use of iterative reconstruction technique. CONTRAST:  81m OMNIPAQUE IOHEXOL 300 MG/ML  SOLN COMPARISON:  Head CT and cervical spine CT both 12/14/2021. No prior abdomen pelvis CT. FINDINGS: CT HEAD FINDINGS Brain: There is moderately advanced cerebral atrophy with atrophic ventriculomegaly and mild cerebellar atrophy. There is moderate to severe small vessel disease of the cerebral white matter. No asymmetry is seen concerning for an acute infarct, hemorrhage or mass. There is no midline shift. Basal cisterns are clear. Vascular: There are calcifications in the carotid siphons. No hyperdense central vessel is seen. Skull: Negative for fractures or focal lesions. 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 There is a small right occipitotemporal scalp hematoma which was larger previously. No new scalp hematoma is seen. Sinuses/Orbits: Old lens replacements. Visualized sinuses, bilateral mastoid air cells are clear. Other: None.  CT CERVICAL SPINE FINDINGS Alignment: Stable findings. Reversed lordosis again noted centered at C5 and 2 mm grade 1 anterolisthesis C2-3, C3-4 and C4-5, trace retrolisthesis C5-6 and C6-7 all likely degenerative etiology. There is no widening of the anterior atlantodental joint. There is spurring along the C1-2 lateral mass articulations. Spurring of the anterior atlantodental joint. Skull base and vertebrae: Osteopenia. No fracture or pathologic bone lesion is seen. Soft tissues and spinal canal: No prevertebral fluid or swelling. No visible canal hematoma. A small left dorsal epidural calcified meningioma is again noted at C4-5, but there is no associated mass effect. Disc levels: There is advanced degenerative disc collapse with bidirectional osteophytes at C5-6 and C6-7, posterior disc osteophyte complexes flattening of the ventral cord surface at these levels. Other levels show lesser degenerative disc space loss minimal endplate spurring without mass effect. There is multilevel facet joint and uncinate hypertrophy with facet joint ankylosis on the left at C3-4. Degenerative foraminal stenosis is greatest on the left at C2-3 and C3-4, and on both sides at C4-5, and on the left again at C5-6 and C6-7. Upper chest: Negative. Other: None. CT ABDOMEN AND PELVIS FINDINGS Lower chest: There are atelectatic bands in the lower lobes and in the elevated right hemidiaphragm. There is an irregular 8 mm noncalcified nodule medially in the right middle lobe on series 4 axial image 2. Remaining lung bases are clear. There are scattered calcifications in the aortic valve leaflets and coronary arteries, mild cardiomegaly. Hepatobiliary: Breathing motion limits evaluation. There is no obvious mass. There is a 1.4 cm stone in the gallbladder neck, which may be impacted but there is no gallbladder thickening or biliary dilatation. Pancreas: No focal abnormality is seen through the breathing motion. Spleen: No focal abnormality  is seen through the breathing motion. No splenomegaly. Adrenals/Urinary Tract: There is no adrenal mass. There is a wedge-shaped cortical scar laterally in the upper pole left kidney. There is a homogeneous 2.1 cm thin walled cyst in the upper pole of the right kidney or 14.1 Hounsfield units, 9 mm low-density lesion medial to this in the right upper pole which is too small to characterize. No imaging follow-up is recommended. Remainder of both kidneys are unremarkable. There is no urinary stone or obstruction. There is no bladder thickening. The ureters insert low in the pelvis indicating pelvic floor laxity but there is no cystocele. Stomach/Bowel: The stomach is contracted. The small bowel is normal in caliber. An appendix is not seen in this patient. There is moderate stool retention particularly  in the cecum and ascending colon, proximal transverse segment with left-sided diverticulosis without evidence of diverticulitis. Diverticulosis most advanced in the sigmoid segment. There is a small rectocele protruding just below the level of the pelvic floor. Vascular/Lymphatic: Aortic atherosclerosis. No enlarged abdominal or pelvic lymph nodes. Branch artery calcifications are noted, greatest in the renal artery ostia. Reproductive: Surgically absent uterus. The left ovary is unremarkable. The right ovary demonstrates a 5 cm thin walled homogeneous simple appearing cyst of 13.2 Hounsfield units. No adnexal solid mass is seen. Other: There is no free air, free hemorrhage or free fluid. There are small umbilical fat hernias. There is no incarcerated abdominal wall hernia. Multiple pelvic phleboliths. Musculoskeletal: There is osteopenia. There is a compression fracture of the T10 vertebral body, with transverse oblique linear fracture line through the inferior vertebral body, buckling along the anterior and posterior cortex, trace retropulsion of the posteroinferior cortex and approximately 15% loss of the overall  vertebral body height. There is no extension into the pedicles and posterior elements. There is no associated herniated disc. There are degenerative changes of the lumbar spine greatest at L4-5 and L5-S1. Mild hip DJD and chondrocalcinosis. IMPRESSION: 1. No acute intracranial CT findings or depressed skull fractures. Calvarial osteopenia. 2. Small right occipitotemporal scalp hematoma, smaller than 2 days ago. 3. Osteopenia, degenerative changes, reversed lordosis in the cervical spine without evidence of fractures or traumatic listhesis. 4. Lower plate transverse oblique compression fracture of the T10 vertebral body with up to 15% loss of the overall vertebral body height, with cortical buckling anteriorly and posteriorly and trace retropulsion. No spinal canal hematoma is seen. 5. 8 mm irregular nodule in the right middle lobe. The usual recommendation per Fleischner guidelines would be for 6-12 month follow-up CT to ensure stability, with additional CT depending on whether or not the patient is high risk. In this case, follow-up imaging should be considered in light of the patient's advanced age and likelihood of intervention if the nodule shows growth. 6. 5 cm simple cyst right ovary. Three-month follow-up ultrasound for reassessment/recharacterization is suggested, taking into account advanced age. 7. No other acute CT findings. Chronic changes including constipation and diverticulosis. 8. Renal cysts and scarring with heavy calcification in the renal artery ostia. 9. 1.4 cm gallstone appears impacted in the gallbladder neck but there is no gallbladder wall thickening. 10. Aortic atherosclerosis. 11. Small rectocele protruding below the level of the pelvic floor. Additional evidence of pelvic floor laxity with low pelvic ureteral insertions but no cystocele. Electronically Signed   By: Telford Nab M.D.   On: 12/16/2021 21:36   CT L-SPINE NO CHARGE  Result Date: 12/16/2021 CLINICAL DATA:  Fall 3 days  prior.  Hyponatremia. EXAM: CT LUMBAR SPINE WITHOUT CONTRAST TECHNIQUE: Multidetector CT imaging of the lumbar spine was performed without intravenous contrast administration. Multiplanar CT image reconstructions were also generated. RADIATION DOSE REDUCTION: This exam was performed according to the departmental dose-optimization program which includes automated exposure control, adjustment of the mA and/or kV according to patient size and/or use of iterative reconstruction technique. COMPARISON:  Lumbar spine radiographs 12/14/2021 FINDINGS: Segmentation: There are 5 non-rib-bearing lumbar-type vertebral bodies. Alignment: No sagittal spondylolisthesis. Minimal right lateral listhesis of L4 on L5. No significant scoliotic curvature. Vertebrae: No lumbar vertebral body height loss. Please see contemporaneous CT thoracic spine report for evaluation of acute to subacute T10 vertebral body compression fracture. Severe left-greater-than-right L4-5 and severe right and moderate left L5-S1 disc space narrowing, endplate sclerosis, and peripheral osteophytosis.  Paraspinal and other soft tissues: Moderate atherosclerotic calcifications within the abdominal aorta and iliac arteries. Tiny low density lesion within the partially visualized anterior right kidney measuring up to 11 x 9 mm, too small to further characterize but statistically most likely a cyst. No follow-up imaging is recommended. Minimally partially visualized distal colonic diverticulosis. Disc levels: L1-2: Mild-to-moderate bilateral facet joint hypertrophy. No central canal or neuroforaminal stenosis. L2-3: Mild-to-moderate bilateral facet joint hypertrophy. Mild posterior endplate spurring. Mild left-greater-than-right neuroforaminal stenosis. Moderate narrowing of the lateral recesses and mild central canal stenosis. L3-4: Moderate bilateral facet joint hypertrophy. Mild broad-based posterior disc osteophyte complex. Borderline mild right neuroforaminal  stenosis. Moderate to severe central canal stenosis. L4-5: Moderate bilateral facet joint hypertrophy. Mild broad-based posterior disc osteophyte complex with moderate left intraforaminal endplate spurring. Moderate left and mild right neuroforaminal stenosis. Moderate narrowing of the lateral recesses and moderate central canal stenosis. L5-S1: Moderate bilateral facet joint hypertrophy. Minimal broad-based posterior disc osteophyte complex with moderate right intraforaminal endplate spurring. Mild-to-moderate right neuroforaminal stenosis. No central canal stenosis. IMPRESSION: 1. Please see contemporaneous CT of the thoracic spine for description of an acute to subacute inferior T10 vertebral body compression fracture. 2. No lumbar vertebral body acute fracture. 3. Multilevel degenerative disc and joint changes as above. Mild L2-3, moderate to severe L3-4, and moderate L4-5 central canal stenosis. 4. Multilevel neuroforaminal stenosis as above. The greatest levels include moderate left L4-5 and mild-to-moderate right L5-S1 neuroforaminal stenosis. Electronically Signed   By: Yvonne Kendall M.D.   On: 12/16/2021 21:32   CT Thoracic Spine Wo Contrast  Result Date: 12/16/2021 CLINICAL DATA:  Back trauma.  Fall 3 days prior.  Hyponatremia. EXAM: CT THORACIC SPINE WITHOUT CONTRAST TECHNIQUE: Multidetector CT images of the thoracic were obtained using the standard protocol without intravenous contrast. RADIATION DOSE REDUCTION: This exam was performed according to the departmental dose-optimization program which includes automated exposure control, adjustment of the mA and/or kV according to patient size and/or use of iterative reconstruction technique. COMPARISON:  Thoracic spine radiographs 12/14/2021 FINDINGS: Alignment: No sagittal spondylolisthesis. Vertebrae: There is minimal approximate 5% height loss of the mid T10 vertebral body. This was seen on recent 12/14/2021 radiographs. There is an acute to subacute  horizontal linear fracture line extending from the anterior mid to inferior T10 vertebral body cortex through the posteroinferior vertebral body cortex. Mild 3 mm anterior and 3 mm posterior T10 vertebral body cortical buckling (sagittal series 6, image 23). There is approximate 30% height loss of the mid to anterior T4 vertebral body, similar to recent 12/15/2021 radiographs. There is chronic sclerosis within the T4 superior endplate. No acute fracture line is seen. No significant retropulsion. Mild T3-4 through T6-7 disc space narrowing. Paraspinal and other soft tissues: Mild-to-moderate atherosclerotic calcifications within the partially visualized thoracic aorta. Mild curvilinear likely subsegmental atelectasis within the partially visualized medial aspect of the lungs. Coronary artery calcifications are noted. Disc levels: T10-11: Mild retropulsion of the posteroinferior T10 vertebral body. Mild bilateral facet joint hypertrophy. Moderate right and borderline mild left neuroforaminal stenosis. No central canal stenosis. IMPRESSION: 1. Acute to subacute T10 vertebral body compression fracture with minimal 5 mm height loss. There is mild posteroinferior T10 vertebral body retropulsion. Moderate right and borderline mild left T10-11 neuroforaminal stenosis. 2. Chronic appearing 30% height loss of the mid to anterior T4 vertebral body. Aortic Atherosclerosis (ICD10-I70.0). Electronically Signed   By: Yvonne Kendall M.D.   On: 12/16/2021 21:20  LOS: 2 days       Emeterio Reeve, DO [facility] 12/18/2021, 1:21 PM   Staff may message me via secure chat in [place]  but this may not receive immediate response,  please page for urgent matters!  If 7PM-7AM, please contact night-coverage [email or web address]  Dictation software was used to generate the above note. Typos may occur and escape review, as with typed/written notes. Please contact Dr Sheppard Coil directly for clarity if  needed.

## 2021-12-18 NOTE — Evaluation (Signed)
Occupational Therapy Evaluation Patient Details Name: Lisa Koch MRN: [id number] DOB: 02-26-1925 Today's Date: 12/18/2021   History of Present Illness Lisa Koch is a 88 y.o. female with medical history significant for HTN, seen in the ED on 10/01 with a fall with brief LOC, treated for UTI(culture with 10,000 urogenital flora) and discharged who was sent by her PCP for admission for hyponatremia.   Clinical Impression   Lisa Koch was seen for OT evaluation this date. Prior to hospital admission, pt was Independent for mobility and ADLs. Pt lives alone, plan to d/c to son and daughter in laws house. Pt presents to acute OT demonstrating impaired ADL performance and functional mobility 2/2 decreased activity tolerance and functional strength/ROM/balance deficits. Pt currently requires MAX A don TLSO in sitting and standing - adjusted to improve fit and comfort. SBA + RW for toilet t/f, tolerated ~60 ft functional mobility. MOD A for LB access. Pt and family educated in functional application of back precautions, log roll technique, and home/routines modifications. Pt verbalized understanding of all education/training provided, will sign off. Upon hospital discharge, recommend no OT follow up.   Recommendations for follow up therapy are one component of a multi-disciplinary discharge planning process, led by the attending physician.  Recommendations may be updated based on patient status, additional functional criteria and insurance authorization.   Follow Up Recommendations  No OT follow up    Assistance Recommended at Discharge Intermittent Supervision/Assistance  Patient can return home with the following A lot of help with bathing/dressing/bathroom;Help with stairs or ramp for entrance    Functional Status Assessment  Patient has had a recent decline in their functional status and demonstrates the ability to make significant improvements in function in a reasonable and predictable  amount of time.  Equipment Recommendations  BSC/3in1    Recommendations for Other Services       Precautions / Restrictions Precautions Precautions: Fall;Back Required Braces or Orthoses: Spinal Brace Spinal Brace: Thoracolumbosacral orthotic;Applied in sitting position Restrictions Weight Bearing Restrictions: No      Mobility Bed Mobility               General bed mobility comments: recieved and left in chair    Transfers Overall transfer level: Needs assistance Equipment used: None Transfers: Sit to/from Stand Sit to Stand: Min guard                  Balance Overall balance assessment: Needs assistance Sitting-balance support: No upper extremity supported, Feet supported Sitting balance-Leahy Scale: Good     Standing balance support: Single extremity supported, During functional activity Standing balance-Leahy Scale: Fair                             ADL either performed or assessed with clinical judgement   ADL Overall ADL's : Needs assistance/impaired                                       General ADL Comments: MAX A don TLSO in sitting and standing. SBA + RW for toilet t/f, tolerated ~60 ft functional mobility. MOD A for LB access      Pertinent Vitals/Pain Pain Assessment Pain Assessment: Faces Faces Pain Scale: Hurts little more Pain Location: low back Pain Descriptors / Indicators: Discomfort, Dull, Grimacing Pain Intervention(s): Limited activity within patient's tolerance, Repositioned  Hand Dominance Right   Extremity/Trunk Assessment Upper Extremity Assessment Upper Extremity Assessment: Overall WFL for tasks assessed   Lower Extremity Assessment Lower Extremity Assessment: Generalized weakness       Communication Communication Communication: HOH   Cognition Arousal/Alertness: Awake/alert Behavior During Therapy: WFL for tasks assessed/performed Overall Cognitive Status: Within Functional  Limits for tasks assessed                                        Home Living Family/patient expects to be discharged to:: Private residence Living Arrangements: Alone Available Help at Discharge: Family;Available 24 hours/day Type of Home: House Home Access: Stairs to enter [employer] of Steps: 3 Entrance Stairs-Rails: Left Home Layout: One level     Bathroom Shower/Tub: Occupational psychologist: Handicapped height     Home Equipment: Rollator (4 wheels);Cane - single point          Prior Functioning/Environment Prior Level of Function : Independent/Modified Independent;Driving             Mobility Comments: no AD use          OT Problem List: Decreased strength;Decreased range of motion;Decreased activity tolerance;Impaired balance (sitting and/or standing)         OT Goals(Current goals can be found in the care plan section) Acute Rehab OT Goals Patient Stated Goal: to go home OT Goal Formulation: With patient/family Time For Goal Achievement: 01/01/22 Potential to Achieve Goals: Good      Co-evaluation              AM-PAC OT "6 Clicks" Daily Activity     Outcome Measure Help from another person eating meals?: None Help from another person taking care of personal grooming?: A Little Help from another person toileting, which includes using toliet, bedpan, or urinal?: A Little Help from another person bathing (including washing, rinsing, drying)?: A Lot Help from another person to put on and taking off regular upper body clothing?: None Help from another person to put on and taking off regular lower body clothing?: A Lot 6 Click Score: 18   End of Session Equipment Utilized During Treatment: Rolling walker (2 wheels)  Activity Tolerance: Patient tolerated treatment well Patient left: in chair;with call bell/phone within reach;with chair alarm set;with family/visitor present  OT Visit Diagnosis: Other  abnormalities of gait and mobility (R26.89);Muscle weakness (generalized) (M62.81)                Time: 8295-6213 OT Time Calculation (min): 39 min Charges:  OT General Charges $OT Visit: 1 Visit OT Evaluation $OT Eval Moderate Complexity: 1 Mod OT Treatments $Self Care/Home Management : 23-37 mins  Dessie Coma, M.S. OTR/L  12/18/21, 12:45 PM  ascom [phone]

## 2021-12-19 DIAGNOSIS — R17 Unspecified jaundice: Secondary | ICD-10-CM

## 2021-12-19 DIAGNOSIS — Z9181 History of falling: Secondary | ICD-10-CM

## 2021-12-19 DIAGNOSIS — E871 Hypo-osmolality and hyponatremia: Secondary | ICD-10-CM

## 2021-12-19 DIAGNOSIS — S22070A Wedge compression fracture of T9-T10 vertebra, initial encounter for closed fracture: Secondary | ICD-10-CM | POA: Diagnosis not present

## 2021-12-19 DIAGNOSIS — K8020 Calculus of gallbladder without cholecystitis without obstruction: Secondary | ICD-10-CM

## 2021-12-19 DIAGNOSIS — I10 Essential (primary) hypertension: Secondary | ICD-10-CM

## 2021-12-19 DIAGNOSIS — G9341 Metabolic encephalopathy: Secondary | ICD-10-CM | POA: Diagnosis not present

## 2021-12-19 LAB — SODIUM
Sodium: 131 mmol/L — ABNORMAL LOW (ref 135–145)
Sodium: 134 mmol/L — ABNORMAL LOW (ref 135–145)

## 2021-12-19 MED ORDER — SENNOSIDES-DOCUSATE SODIUM 8.6-50 MG PO TABS
2.0000 | ORAL_TABLET | Freq: Two times a day (BID) | ORAL | Status: AC
  Administered 2021-12-19 – 2021-12-20 (×2): 2 via ORAL
  Filled 2021-12-19 (×2): qty 2

## 2021-12-19 MED ORDER — IPRATROPIUM-ALBUTEROL 0.5-2.5 (3) MG/3ML IN SOLN
3.0000 mL | Freq: Once | RESPIRATORY_TRACT | Status: AC
  Administered 2021-12-19: 3 mL via RESPIRATORY_TRACT
  Filled 2021-12-19: qty 3

## 2021-12-19 MED ORDER — SALINE SPRAY 0.65 % NA SOLN
1.0000 | NASAL | Status: DC | PRN
  Filled 2021-12-19: qty 44

## 2021-12-19 MED ORDER — SODIUM CHLORIDE 1 G PO TABS
2.0000 g | ORAL_TABLET | Freq: Three times a day (TID) | ORAL | Status: DC
  Administered 2021-12-19: 2 g via ORAL
  Filled 2021-12-19: qty 2

## 2021-12-19 MED ORDER — POLYETHYLENE GLYCOL 3350 17 G PO PACK
17.0000 g | PACK | Freq: Two times a day (BID) | ORAL | Status: AC
  Administered 2021-12-19 – 2021-12-20 (×2): 17 g via ORAL
  Filled 2021-12-19 (×2): qty 1

## 2021-12-19 NOTE — Progress Notes (Signed)
PROGRESS NOTE    Lisa Koch   DVV:[id number] DOB: Jan 17, 1925  DOA: 12/16/2021 Date of Service: 12/19/21 PCP: Tracie Harrier, MD     Brief Narrative / Hospital Course:  Lisa Koch is a 88 y.o. female with medical history significant for HTN, seen in the ED on 10/01 with a fall with brief LOC, treated for UTI(culture with 10,000 urogenital flora) and discharged who was sent by her PCP for admission for hyponatremia.  Patient presented to her PCP for post ED follow-up and family was concerned about ongoing confusion, persistent nausea and persistent thoracic and lower back pain.  She had a CT scan on 10/one of her spine that showed chronic compression fractures.  They also noted that she had a slightly distended abdomen that she had not had a bowel movement in 3 days. 10/03: BP 171/67, pulse 60, respirations 21 with O2 sat 96% on room air and afebrile.  Labs significant for sodium 128, down from 132 on 10/01.  Bilirubin 3.1 up from 0.6 on 10/01.  WBC 13.5, about the same as 2 days ago in spite of treatment with Keflex for UTI.  Serum osmolality 252, urine osmolality 215. Trauma imaging including CT head, C-spine and abdomen and pelvis with mostly nonacute findings.  Notable findings included a T10 vertebral body compression fracture with 15% height loss, 1.4 cm gallstone impacted in the gallbladder neck without gallbladder wall thickening. Patient given sodium chloride bolus.  A TLSO brace was applied.  Hospitalist consulted for admission - admitted for acute metabolic encephalopathy secondary to hyponatremia, possibly postconcussive from fall, versus possible underlying infection.  Continued on home Keflex, urine recollected.  Continued on IV normal saline for hyponatremia.  Concern for cholelithiasis/elevated bilirubin, monitoring hepatic function and added on lipase.  Neurosurgery consulted: Maintain TLSO brace but okay to remove while in bed, PT/OT, arrange outpatient follow-up. 10/04:  bradycardic into 30s-40s per RN early AM, EKG no concerns, sinus. HR improved on rounds. Continue treat pain and hyponatremia.  Caused NS infusion due to concern for rapid correction, sodium was stable in the afternoon 128 10/05: Sodium on AM labs to 125, restarted NS --> 128 mid-day, recheck pending later this afternoon  10/06: Sodium to 131 then 134 today. Will trial d/c IV fluids and maintain on salt tabs for outpatient follow-up.   Consultants:  Neurosurgery - no procedures planned  Procedures: none      ASSESSMENT & PLAN:   Principal Problem:   Acute metabolic encephalopathy Active Problems:   Acute hyponatremia   Cholelithiasis   HTN (hypertension)   Recent fall with LOC 12/14/21   Elevated bilirubin   Back pain secondary to closed wedge compression fracture of tenth thoracic vertebra (HCC)  Acute hyponatremia Hypotonic and suspect hypovolemic, also thiazide effect  IV NS bolus and continuous fluids after that have been done  D/c home HCTZ Serum osmolality 252, urine osmolality 215 and urine sodium 12 IV NS --> discontinued afternoon 12/17/2021 due to sodium going from 120-128 in less than 24 hours, recehck in PM --> stable at 128 --> AM 10/05 down to 125, resume NS --> 128 mid-day --> 131 AM today 10/06 and 134 later in afternoon --> Will trial reduce IV fluids rate, continue fluid restriction.   Cholelithiasis Elevated bilirubin Bilirubin elevated at 1.4 above 0.6 a couple days prior patient symptomatic for nausea.  WBC elevated.  LFTs normal CT shows 1.4 cm gallstone appears impacted in the gallbladder neck but there is no gallbladder wall thickening. Continue  to monitor hepatic function --> stable and WNL will add on lipase --> WNL  Acute metabolic encephalopathy Secondary to acute hyponatremia, possible postconcussive from fall, possible underlying infection Mental status appears improved 12/17/21 alert/oriented  Neurologic checks, fall precautions Urine  recollected, WNL no UTI  HTN (hypertension) Elevated BP with systolic in the 269S to 854O Continue lisinopril and verapamil DC HCTZ given hyponatremia  Back pain secondary to closed wedge compression fracture of tenth thoracic vertebra (HCC) Continue TLSO brace while OOB Pain control PT/OT - no follow-up needed Outpatient follow-up with neurosurgery  Recent fall with LOC 12/14/21 Etiology of fall likely d/t hyponatremia/weakness.   Patient has improving occipitotemporal scalp hematoma without intracranial injury Fall precautions PT/OT eval - no follow-up needed  Neurologic checks can d/c  Nasal Congestion Family requesting something to help patient cough up "frog in her throat" DuoNeb x1 Saline nasal spry     DVT prophylaxis: lovenox Pertinent IV fluids/nutrition: NS treating hyponatremia Central lines / invasive devices: none  Code Status: DNR Family Communication: son at bedside on rounds   Disposition: inpatient TOC needs: none at this time  Barriers to discharge / significant pending items: hyponatremia             Subjective:  Patient reports pain is controlled, no concerns at this point. Family states confusion is better but she still seems a bit "slow"        Objective:  Vitals:   12/18/21 1621 12/18/21 1933 12/19/21 0427 12/19/21 0834  BP: (!) 142/74 (!) 145/67 (!) 153/81 (!) 157/72  Pulse: 72 82 (!) 59 (!) 56  Resp: '14 18 18 16  '$ Temp: 97.6 F (36.4 C) 98 F (36.7 C) 99.1 F (37.3 C) 98.3 F (36.8 C)  TempSrc:      SpO2: 96% 99% 97% 96%  Weight:      Height:        Intake/Output Summary (Last 24 hours) at 12/19/2021 1612 Last data filed at 12/19/2021 1300 Gross per 24 hour  Intake 501.86 ml  Output --  Net 501.86 ml   Filed Weights   12/16/21 1944  Weight: 60 kg    Examination:  Constitutional:  VS as above General Appearance: alert, NAD. Very hard of hearing.  Ears, Nose, Mouth, Throat: Normal external  appearance MMM Neck: No masses, trachea midline Respiratory: - exam limited d/t brace Normal respiratory effort No wheeze No rhonchi No rales Cardiovascular: S1/S2 normal RRR No murmur No rub/gallop auscultated Gastrointestinal: No tenderness Musculoskeletal:  No clubbing/cyanosis of digits Symmetrical movement in all extremities Neurological: No cranial nerve deficit on limited exam Alert, oriented Psychiatric: Normal judgment/insight Pleasant mood and affect       Scheduled Medications:   enoxaparin (LOVENOX) injection  40 mg Subcutaneous Q24H   lisinopril  40 mg Oral Daily   [START ON 12/22/2021] methotrexate  10 mg Oral Weekly   pantoprazole  40 mg Oral Daily   polyethylene glycol  17 g Oral BID   potassium chloride  20 mEq Oral q1800   senna-docusate  2 tablet Oral BID   sodium chloride  2 g Oral TID WC    Continuous Infusions:  sodium chloride 100 mL/hr at 12/19/21 1438     PRN Medications:  acetaminophen **OR** acetaminophen, HYDROcodone-acetaminophen, ondansetron **OR** ondansetron (ZOFRAN) IV, sodium chloride  Antimicrobials:  Anti-infectives (From admission, onward)    None       Data Reviewed: I have personally reviewed following labs and imaging studies  CBC: Recent Labs  Lab 12/14/21 0022 12/16/21 1953 12/17/21 0400  WBC 13.9* 13.5* 9.5  NEUTROABS 11.1* 11.1*  --   HGB 12.7 12.2 11.7*  HCT 37.7 34.8* 33.1*  MCV 96.9 93.3 93.8  PLT 200 196 465   Basic Metabolic Panel: Recent Labs  Lab 12/14/21 0022 12/16/21 1953 12/17/21 0400 12/17/21 1049 12/17/21 1849 12/18/21 0523 12/18/21 1155 12/18/21 1814 12/19/21 0725 12/19/21 1521  NA 132* 120* 125* 128*   < > 125* 128* 128* 131* 134*  K 4.2 4.3 3.9 3.7  --  4.0  --   --   --   --   CL 100 85* 93* 97*  --  94*  --   --   --   --   CO2 '24 24 26 24  '$ --  22  --   --   --   --   GLUCOSE 237* 106* 95 146*  --  136*  --   --   --   --   BUN 28* '17 14 13  '$ --  14  --   --   --   --    CREATININE 1.09* 0.68 0.68 0.68  --  0.69  --   --   --   --   CALCIUM 9.3 10.1 9.3 9.0  --  9.1  --   --   --   --    < > = values in this interval not displayed.   GFR: Estimated Creatinine Clearance: 34.1 mL/min (by C-G formula based on SCr of 0.69 mg/dL). Liver Function Tests: Recent Labs  Lab 12/14/21 0022 12/16/21 1953 12/17/21 0400  AST '28 27 24  '$ ALT '25 27 25  '$ ALKPHOS 68 53 50  BILITOT 0.6 1.3* 1.3*  PROT 6.2* 6.0* 5.5*  ALBUMIN 3.8 3.5 3.4*   Recent Labs  Lab 12/14/21 0022 12/17/21 0400  LIPASE 47 34   No results for input(s): "AMMONIA" in the last 168 hours. Coagulation Profile: No results for input(s): "INR", "PROTIME" in the last 168 hours. Cardiac Enzymes: No results for input(s): "CKTOTAL", "CKMB", "CKMBINDEX", "TROPONINI" in the last 168 hours. BNP (last 3 results) No results for input(s): "PROBNP" in the last 8760 hours. HbA1C: No results for input(s): "HGBA1C" in the last 72 hours. CBG: No results for input(s): "GLUCAP" in the last 168 hours. Lipid Profile: No results for input(s): "CHOL", "HDL", "LDLCALC", "TRIG", "CHOLHDL", "LDLDIRECT" in the last 72 hours. Thyroid Function Tests: No results for input(s): "TSH", "T4TOTAL", "FREET4", "T3FREE", "THYROIDAB" in the last 72 hours. Anemia Panel: No results for input(s): "VITAMINB12", "FOLATE", "FERRITIN", "TIBC", "IRON", "RETICCTPCT" in the last 72 hours. Urine analysis:    Component Value Date/Time   COLORURINE STRAW (A) 12/16/2021 2349   APPEARANCEUR CLEAR (A) 12/16/2021 2349   LABSPEC 1.013 12/16/2021 2349   PHURINE 6.0 12/16/2021 2349   GLUCOSEU NEGATIVE 12/16/2021 2349   HGBUR NEGATIVE 12/16/2021 2349   BILIRUBINUR NEGATIVE 12/16/2021 2349   KETONESUR 20 (A) 12/16/2021 2349   PROTEINUR NEGATIVE 12/16/2021 2349   NITRITE NEGATIVE 12/16/2021 2349   LEUKOCYTESUR NEGATIVE 12/16/2021 2349   Sepsis Labs: '@LABRCNTIP'$ (procalcitonin:4,lacticidven:4)  Recent Results (from the past 240 hour(s))   Urine Culture     Status: Abnormal   Collection Time: 12/14/21  1:40 AM   Specimen: Urine, Clean Catch  Result Value Ref Range Status   Specimen Description   Final    URINE, CLEAN CATCH Performed at [facility], [address], [place], [place] [address]    Special Requests  Final    NONE Performed at [facility], [address] [place], [place] [address]    Culture (A)  Final    10,000 COLONIES/mL MULTIPLE SPECIES PRESENT, SUGGEST RECOLLECTION   Report Status 12/15/2021 FINAL  Final  Blood culture (single)     Status: None (Preliminary result)   Collection Time: 12/16/21  7:54 PM   Specimen: BLOOD  Result Value Ref Range Status   Specimen Description BLOOD LEFT FOREARM  Final   Special Requests   Final    BOTTLES DRAWN AEROBIC AND ANAEROBIC Blood Culture results may not be optimal due to an inadequate volume of blood received in culture bottles   Culture   Final    NO GROWTH 3 DAYS Performed at [facility], [address], [place], [place] [address]    Report Status PENDING  Incomplete         Radiology Studies: CT HEAD WO CONTRAST (5MM)  Result Date: 12/16/2021 CLINICAL DATA:  [place] 3 days ago, with hyponatremia and confusion. Neck pain. EXAM: CT HEAD WITHOUT CONTRAST CT CERVICAL SPINE WITHOUT CONTRAST CT ABDOMEN AND PELVIS WITH CONTRAST TECHNIQUE: Contiguous axial images were obtained from the base of the skull through the vertex without intravenous contrast. Multidetector CT imaging of the cervical spine was performed without intravenous contrast. Multiplanar CT image reconstructions were also generated. Multidetector CT imaging of the abdomen and pelvis was performed using the standard protocol following bolus administration of intravenous contrast. RADIATION DOSE REDUCTION: This exam was performed according to the departmental dose-optimization program which includes automated exposure control, adjustment of the mA and/or kV according to  patient size and/or use of iterative reconstruction technique. CONTRAST:  34m OMNIPAQUE IOHEXOL 300 MG/ML  SOLN COMPARISON:  Head CT and cervical spine CT both 12/14/2021. No prior abdomen pelvis CT. FINDINGS: CT HEAD FINDINGS Brain: There is moderately advanced cerebral atrophy with atrophic ventriculomegaly and mild cerebellar atrophy. There is moderate to severe small vessel disease of the cerebral white matter. No asymmetry is seen concerning for an acute infarct, hemorrhage or mass. There is no midline shift. Basal cisterns are clear. Vascular: There are calcifications in the carotid siphons. No hyperdense central vessel is seen. Skull: Negative for fractures or focal lesions. 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 There is a small right occipitotemporal scalp hematoma which was larger previously. No new scalp hematoma is seen. Sinuses/Orbits: Old lens replacements. Visualized sinuses, bilateral mastoid air cells are clear. Other: None. CT CERVICAL SPINE FINDINGS Alignment: Stable findings. Reversed lordosis again noted centered at C5 and 2 mm grade 1 anterolisthesis C2-3, C3-4 and C4-5, trace retrolisthesis C5-6 and C6-7 all likely degenerative etiology. There is no widening of the anterior atlantodental joint. There is spurring along the C1-2 lateral mass articulations. Spurring of the anterior atlantodental joint. Skull base and vertebrae: Osteopenia. No fracture or pathologic bone lesion is seen. Soft tissues and spinal canal: No prevertebral fluid or swelling. No visible canal hematoma. A small left dorsal epidural calcified meningioma is again noted at C4-5, but there is no associated mass effect. Disc levels: There is advanced degenerative disc collapse with bidirectional osteophytes at C5-6 and C6-7, posterior disc osteophyte complexes flattening of the ventral cord surface at these levels. Other levels show lesser degenerative disc space loss minimal endplate spurring without mass effect. There is multilevel  facet joint and uncinate hypertrophy with facet joint ankylosis on the left at C3-4. Degenerative foraminal stenosis is greatest on the left at C2-3 and C3-4, and on both sides at  C4-5, and on the left again at C5-6 and C6-7. Upper chest: Negative. Other: None. CT ABDOMEN AND PELVIS FINDINGS Lower chest: There are atelectatic bands in the lower lobes and in the elevated right hemidiaphragm. There is an irregular 8 mm noncalcified nodule medially in the right middle lobe on series 4 axial image 2. Remaining lung bases are clear. There are scattered calcifications in the aortic valve leaflets and coronary arteries, mild cardiomegaly. Hepatobiliary: Breathing motion limits evaluation. There is no obvious mass. There is a 1.4 cm stone in the gallbladder neck, which may be impacted but there is no gallbladder thickening or biliary dilatation. Pancreas: No focal abnormality is seen through the breathing motion. Spleen: No focal abnormality is seen through the breathing motion. No splenomegaly. Adrenals/Urinary Tract: There is no adrenal mass. There is a wedge-shaped cortical scar laterally in the upper pole left kidney. There is a homogeneous 2.1 cm thin walled cyst in the upper pole of the right kidney or 14.1 Hounsfield units, 9 mm low-density lesion medial to this in the right upper pole which is too small to characterize. No imaging follow-up is recommended. Remainder of both kidneys are unremarkable. There is no urinary stone or obstruction. There is no bladder thickening. The ureters insert low in the pelvis indicating pelvic floor laxity but there is no cystocele. Stomach/Bowel: The stomach is contracted. The small bowel is normal in caliber. An appendix is not seen in this patient. There is moderate stool retention particularly in the cecum and ascending colon, proximal transverse segment with left-sided diverticulosis without evidence of diverticulitis. Diverticulosis most advanced in the sigmoid segment. There  is a small rectocele protruding just below the level of the pelvic floor. Vascular/Lymphatic: Aortic atherosclerosis. No enlarged abdominal or pelvic lymph nodes. Branch artery calcifications are noted, greatest in the renal artery ostia. Reproductive: Surgically absent uterus. The left ovary is unremarkable. The right ovary demonstrates a 5 cm thin walled homogeneous simple appearing cyst of 13.2 Hounsfield units. No adnexal solid mass is seen. Other: There is no free air, free hemorrhage or free fluid. There are small umbilical fat hernias. There is no incarcerated abdominal wall hernia. Multiple pelvic phleboliths. Musculoskeletal: There is osteopenia. There is a compression fracture of the T10 vertebral body, with transverse oblique linear fracture line through the inferior vertebral body, buckling along the anterior and posterior cortex, trace retropulsion of the posteroinferior cortex and approximately 15% loss of the overall vertebral body height. There is no extension into the pedicles and posterior elements. There is no associated herniated disc. There are degenerative changes of the lumbar spine greatest at L4-5 and L5-S1. Mild hip DJD and chondrocalcinosis. IMPRESSION: 1. No acute intracranial CT findings or depressed skull fractures. Calvarial osteopenia. 2. Small right occipitotemporal scalp hematoma, smaller than 2 days ago. 3. Osteopenia, degenerative changes, reversed lordosis in the cervical spine without evidence of fractures or traumatic listhesis. 4. Lower plate transverse oblique compression fracture of the T10 vertebral body with up to 15% loss of the overall vertebral body height, with cortical buckling anteriorly and posteriorly and trace retropulsion. No spinal canal hematoma is seen. 5. 8 mm irregular nodule in the right middle lobe. The usual recommendation per Fleischner guidelines would be for 6-12 month follow-up CT to ensure stability, with additional CT depending on whether or not the  patient is high risk. In this case, follow-up imaging should be considered in light of the patient's advanced age and likelihood of intervention if the nodule shows growth. 6. 5 cm  simple cyst right ovary. Three-month follow-up ultrasound for reassessment/recharacterization is suggested, taking into account advanced age. 7. No other acute CT findings. Chronic changes including constipation and diverticulosis. 8. Renal cysts and scarring with heavy calcification in the renal artery ostia. 9. 1.4 cm gallstone appears impacted in the gallbladder neck but there is no gallbladder wall thickening. 10. Aortic atherosclerosis. 11. Small rectocele protruding below the level of the pelvic floor. Additional evidence of pelvic floor laxity with low pelvic ureteral insertions but no cystocele. Electronically Signed   By: Telford Nab M.D.   On: 12/16/2021 21:36   CT Cervical Spine Wo Contrast  Result Date: 12/16/2021 CLINICAL DATA:  [place] 3 days ago, with hyponatremia and confusion. Neck pain. EXAM: CT HEAD WITHOUT CONTRAST CT CERVICAL SPINE WITHOUT CONTRAST CT ABDOMEN AND PELVIS WITH CONTRAST TECHNIQUE: Contiguous axial images were obtained from the base of the skull through the vertex without intravenous contrast. Multidetector CT imaging of the cervical spine was performed without intravenous contrast. Multiplanar CT image reconstructions were also generated. Multidetector CT imaging of the abdomen and pelvis was performed using the standard protocol following bolus administration of intravenous contrast. RADIATION DOSE REDUCTION: This exam was performed according to the departmental dose-optimization program which includes automated exposure control, adjustment of the mA and/or kV according to patient size and/or use of iterative reconstruction technique. CONTRAST:  40m OMNIPAQUE IOHEXOL 300 MG/ML  SOLN COMPARISON:  Head CT and cervical spine CT both 12/14/2021. No prior abdomen pelvis CT. FINDINGS: CT HEAD FINDINGS  Brain: There is moderately advanced cerebral atrophy with atrophic ventriculomegaly and mild cerebellar atrophy. There is moderate to severe small vessel disease of the cerebral white matter. No asymmetry is seen concerning for an acute infarct, hemorrhage or mass. There is no midline shift. Basal cisterns are clear. Vascular: There are calcifications in the carotid siphons. No hyperdense central vessel is seen. Skull: Negative for fractures or focal lesions. 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 There is a small right occipitotemporal scalp hematoma which was larger previously. No new scalp hematoma is seen. Sinuses/Orbits: Old lens replacements. Visualized sinuses, bilateral mastoid air cells are clear. Other: None. CT CERVICAL SPINE FINDINGS Alignment: Stable findings. Reversed lordosis again noted centered at C5 and 2 mm grade 1 anterolisthesis C2-3, C3-4 and C4-5, trace retrolisthesis C5-6 and C6-7 all likely degenerative etiology. There is no widening of the anterior atlantodental joint. There is spurring along the C1-2 lateral mass articulations. Spurring of the anterior atlantodental joint. Skull base and vertebrae: Osteopenia. No fracture or pathologic bone lesion is seen. Soft tissues and spinal canal: No prevertebral fluid or swelling. No visible canal hematoma. A small left dorsal epidural calcified meningioma is again noted at C4-5, but there is no associated mass effect. Disc levels: There is advanced degenerative disc collapse with bidirectional osteophytes at C5-6 and C6-7, posterior disc osteophyte complexes flattening of the ventral cord surface at these levels. Other levels show lesser degenerative disc space loss minimal endplate spurring without mass effect. There is multilevel facet joint and uncinate hypertrophy with facet joint ankylosis on the left at C3-4. Degenerative foraminal stenosis is greatest on the left at C2-3 and C3-4, and on both sides at C4-5, and on the left again at C5-6 and C6-7.  Upper chest: Negative. Other: None. CT ABDOMEN AND PELVIS FINDINGS Lower chest: There are atelectatic bands in the lower lobes and in the elevated right hemidiaphragm. There is an irregular 8 mm noncalcified nodule medially in the right middle lobe on series 4 axial image  2. Remaining lung bases are clear. There are scattered calcifications in the aortic valve leaflets and coronary arteries, mild cardiomegaly. Hepatobiliary: Breathing motion limits evaluation. There is no obvious mass. There is a 1.4 cm stone in the gallbladder neck, which may be impacted but there is no gallbladder thickening or biliary dilatation. Pancreas: No focal abnormality is seen through the breathing motion. Spleen: No focal abnormality is seen through the breathing motion. No splenomegaly. Adrenals/Urinary Tract: There is no adrenal mass. There is a wedge-shaped cortical scar laterally in the upper pole left kidney. There is a homogeneous 2.1 cm thin walled cyst in the upper pole of the right kidney or 14.1 Hounsfield units, 9 mm low-density lesion medial to this in the right upper pole which is too small to characterize. No imaging follow-up is recommended. Remainder of both kidneys are unremarkable. There is no urinary stone or obstruction. There is no bladder thickening. The ureters insert low in the pelvis indicating pelvic floor laxity but there is no cystocele. Stomach/Bowel: The stomach is contracted. The small bowel is normal in caliber. An appendix is not seen in this patient. There is moderate stool retention particularly in the cecum and ascending colon, proximal transverse segment with left-sided diverticulosis without evidence of diverticulitis. Diverticulosis most advanced in the sigmoid segment. There is a small rectocele protruding just below the level of the pelvic floor. Vascular/Lymphatic: Aortic atherosclerosis. No enlarged abdominal or pelvic lymph nodes. Branch artery calcifications are noted, greatest in the renal  artery ostia. Reproductive: Surgically absent uterus. The left ovary is unremarkable. The right ovary demonstrates a 5 cm thin walled homogeneous simple appearing cyst of 13.2 Hounsfield units. No adnexal solid mass is seen. Other: There is no free air, free hemorrhage or free fluid. There are small umbilical fat hernias. There is no incarcerated abdominal wall hernia. Multiple pelvic phleboliths. Musculoskeletal: There is osteopenia. There is a compression fracture of the T10 vertebral body, with transverse oblique linear fracture line through the inferior vertebral body, buckling along the anterior and posterior cortex, trace retropulsion of the posteroinferior cortex and approximately 15% loss of the overall vertebral body height. There is no extension into the pedicles and posterior elements. There is no associated herniated disc. There are degenerative changes of the lumbar spine greatest at L4-5 and L5-S1. Mild hip DJD and chondrocalcinosis. IMPRESSION: 1. No acute intracranial CT findings or depressed skull fractures. Calvarial osteopenia. 2. Small right occipitotemporal scalp hematoma, smaller than 2 days ago. 3. Osteopenia, degenerative changes, reversed lordosis in the cervical spine without evidence of fractures or traumatic listhesis. 4. Lower plate transverse oblique compression fracture of the T10 vertebral body with up to 15% loss of the overall vertebral body height, with cortical buckling anteriorly and posteriorly and trace retropulsion. No spinal canal hematoma is seen. 5. 8 mm irregular nodule in the right middle lobe. The usual recommendation per Fleischner guidelines would be for 6-12 month follow-up CT to ensure stability, with additional CT depending on whether or not the patient is high risk. In this case, follow-up imaging should be considered in light of the patient's advanced age and likelihood of intervention if the nodule shows growth. 6. 5 cm simple cyst right ovary. Three-month  follow-up ultrasound for reassessment/recharacterization is suggested, taking into account advanced age. 7. No other acute CT findings. Chronic changes including constipation and diverticulosis. 8. Renal cysts and scarring with heavy calcification in the renal artery ostia. 9. 1.4 cm gallstone appears impacted in the gallbladder neck but there is no  gallbladder wall thickening. 10. Aortic atherosclerosis. 11. Small rectocele protruding below the level of the pelvic floor. Additional evidence of pelvic floor laxity with low pelvic ureteral insertions but no cystocele. Electronically Signed   By: Telford Nab M.D.   On: 12/16/2021 21:36   CT ABDOMEN PELVIS W CONTRAST  Result Date: 12/16/2021 CLINICAL DATA:  [place] 3 days ago, with hyponatremia and confusion. Neck pain. EXAM: CT HEAD WITHOUT CONTRAST CT CERVICAL SPINE WITHOUT CONTRAST CT ABDOMEN AND PELVIS WITH CONTRAST TECHNIQUE: Contiguous axial images were obtained from the base of the skull through the vertex without intravenous contrast. Multidetector CT imaging of the cervical spine was performed without intravenous contrast. Multiplanar CT image reconstructions were also generated. Multidetector CT imaging of the abdomen and pelvis was performed using the standard protocol following bolus administration of intravenous contrast. RADIATION DOSE REDUCTION: This exam was performed according to the departmental dose-optimization program which includes automated exposure control, adjustment of the mA and/or kV according to patient size and/or use of iterative reconstruction technique. CONTRAST:  26m OMNIPAQUE IOHEXOL 300 MG/ML  SOLN COMPARISON:  Head CT and cervical spine CT both 12/14/2021. No prior abdomen pelvis CT. FINDINGS: CT HEAD FINDINGS Brain: There is moderately advanced cerebral atrophy with atrophic ventriculomegaly and mild cerebellar atrophy. There is moderate to severe small vessel disease of the cerebral white matter. No asymmetry is seen  concerning for an acute infarct, hemorrhage or mass. There is no midline shift. Basal cisterns are clear. Vascular: There are calcifications in the carotid siphons. No hyperdense central vessel is seen. Skull: Negative for fractures or focal lesions. 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 There is a small right occipitotemporal scalp hematoma which was larger previously. No new scalp hematoma is seen. Sinuses/Orbits: Old lens replacements. Visualized sinuses, bilateral mastoid air cells are clear. Other: None. CT CERVICAL SPINE FINDINGS Alignment: Stable findings. Reversed lordosis again noted centered at C5 and 2 mm grade 1 anterolisthesis C2-3, C3-4 and C4-5, trace retrolisthesis C5-6 and C6-7 all likely degenerative etiology. There is no widening of the anterior atlantodental joint. There is spurring along the C1-2 lateral mass articulations. Spurring of the anterior atlantodental joint. Skull base and vertebrae: Osteopenia. No fracture or pathologic bone lesion is seen. Soft tissues and spinal canal: No prevertebral fluid or swelling. No visible canal hematoma. A small left dorsal epidural calcified meningioma is again noted at C4-5, but there is no associated mass effect. Disc levels: There is advanced degenerative disc collapse with bidirectional osteophytes at C5-6 and C6-7, posterior disc osteophyte complexes flattening of the ventral cord surface at these levels. Other levels show lesser degenerative disc space loss minimal endplate spurring without mass effect. There is multilevel facet joint and uncinate hypertrophy with facet joint ankylosis on the left at C3-4. Degenerative foraminal stenosis is greatest on the left at C2-3 and C3-4, and on both sides at C4-5, and on the left again at C5-6 and C6-7. Upper chest: Negative. Other: None. CT ABDOMEN AND PELVIS FINDINGS Lower chest: There are atelectatic bands in the lower lobes and in the elevated right hemidiaphragm. There is an irregular 8 mm noncalcified nodule  medially in the right middle lobe on series 4 axial image 2. Remaining lung bases are clear. There are scattered calcifications in the aortic valve leaflets and coronary arteries, mild cardiomegaly. Hepatobiliary: Breathing motion limits evaluation. There is no obvious mass. There is a 1.4 cm stone in the gallbladder neck, which may be impacted but there is no gallbladder thickening or biliary dilatation. Pancreas: No  focal abnormality is seen through the breathing motion. Spleen: No focal abnormality is seen through the breathing motion. No splenomegaly. Adrenals/Urinary Tract: There is no adrenal mass. There is a wedge-shaped cortical scar laterally in the upper pole left kidney. There is a homogeneous 2.1 cm thin walled cyst in the upper pole of the right kidney or 14.1 Hounsfield units, 9 mm low-density lesion medial to this in the right upper pole which is too small to characterize. No imaging follow-up is recommended. Remainder of both kidneys are unremarkable. There is no urinary stone or obstruction. There is no bladder thickening. The ureters insert low in the pelvis indicating pelvic floor laxity but there is no cystocele. Stomach/Bowel: The stomach is contracted. The small bowel is normal in caliber. An appendix is not seen in this patient. There is moderate stool retention particularly in the cecum and ascending colon, proximal transverse segment with left-sided diverticulosis without evidence of diverticulitis. Diverticulosis most advanced in the sigmoid segment. There is a small rectocele protruding just below the level of the pelvic floor. Vascular/Lymphatic: Aortic atherosclerosis. No enlarged abdominal or pelvic lymph nodes. Branch artery calcifications are noted, greatest in the renal artery ostia. Reproductive: Surgically absent uterus. The left ovary is unremarkable. The right ovary demonstrates a 5 cm thin walled homogeneous simple appearing cyst of 13.2 Hounsfield units. No adnexal solid mass  is seen. Other: There is no free air, free hemorrhage or free fluid. There are small umbilical fat hernias. There is no incarcerated abdominal wall hernia. Multiple pelvic phleboliths. Musculoskeletal: There is osteopenia. There is a compression fracture of the T10 vertebral body, with transverse oblique linear fracture line through the inferior vertebral body, buckling along the anterior and posterior cortex, trace retropulsion of the posteroinferior cortex and approximately 15% loss of the overall vertebral body height. There is no extension into the pedicles and posterior elements. There is no associated herniated disc. There are degenerative changes of the lumbar spine greatest at L4-5 and L5-S1. Mild hip DJD and chondrocalcinosis. IMPRESSION: 1. No acute intracranial CT findings or depressed skull fractures. Calvarial osteopenia. 2. Small right occipitotemporal scalp hematoma, smaller than 2 days ago. 3. Osteopenia, degenerative changes, reversed lordosis in the cervical spine without evidence of fractures or traumatic listhesis. 4. Lower plate transverse oblique compression fracture of the T10 vertebral body with up to 15% loss of the overall vertebral body height, with cortical buckling anteriorly and posteriorly and trace retropulsion. No spinal canal hematoma is seen. 5. 8 mm irregular nodule in the right middle lobe. The usual recommendation per Fleischner guidelines would be for 6-12 month follow-up CT to ensure stability, with additional CT depending on whether or not the patient is high risk. In this case, follow-up imaging should be considered in light of the patient's advanced age and likelihood of intervention if the nodule shows growth. 6. 5 cm simple cyst right ovary. Three-month follow-up ultrasound for reassessment/recharacterization is suggested, taking into account advanced age. 7. No other acute CT findings. Chronic changes including constipation and diverticulosis. 8. Renal cysts and scarring  with heavy calcification in the renal artery ostia. 9. 1.4 cm gallstone appears impacted in the gallbladder neck but there is no gallbladder wall thickening. 10. Aortic atherosclerosis. 11. Small rectocele protruding below the level of the pelvic floor. Additional evidence of pelvic floor laxity with low pelvic ureteral insertions but no cystocele. Electronically Signed   By: Telford Nab M.D.   On: 12/16/2021 21:36   CT L-SPINE NO CHARGE  Result Date: 12/16/2021  CLINICAL DATA:  Fall 3 days prior.  Hyponatremia. EXAM: CT LUMBAR SPINE WITHOUT CONTRAST TECHNIQUE: Multidetector CT imaging of the lumbar spine was performed without intravenous contrast administration. Multiplanar CT image reconstructions were also generated. RADIATION DOSE REDUCTION: This exam was performed according to the departmental dose-optimization program which includes automated exposure control, adjustment of the mA and/or kV according to patient size and/or use of iterative reconstruction technique. COMPARISON:  Lumbar spine radiographs 12/14/2021 FINDINGS: Segmentation: There are 5 non-rib-bearing lumbar-type vertebral bodies. Alignment: No sagittal spondylolisthesis. Minimal right lateral listhesis of L4 on L5. No significant scoliotic curvature. Vertebrae: No lumbar vertebral body height loss. Please see contemporaneous CT thoracic spine report for evaluation of acute to subacute T10 vertebral body compression fracture. Severe left-greater-than-right L4-5 and severe right and moderate left L5-S1 disc space narrowing, endplate sclerosis, and peripheral osteophytosis. Paraspinal and other soft tissues: Moderate atherosclerotic calcifications within the abdominal aorta and iliac arteries. Tiny low density lesion within the partially visualized anterior right kidney measuring up to 11 x 9 mm, too small to further characterize but statistically most likely a cyst. No follow-up imaging is recommended. Minimally partially visualized distal  colonic diverticulosis. Disc levels: L1-2: Mild-to-moderate bilateral facet joint hypertrophy. No central canal or neuroforaminal stenosis. L2-3: Mild-to-moderate bilateral facet joint hypertrophy. Mild posterior endplate spurring. Mild left-greater-than-right neuroforaminal stenosis. Moderate narrowing of the lateral recesses and mild central canal stenosis. L3-4: Moderate bilateral facet joint hypertrophy. Mild broad-based posterior disc osteophyte complex. Borderline mild right neuroforaminal stenosis. Moderate to severe central canal stenosis. L4-5: Moderate bilateral facet joint hypertrophy. Mild broad-based posterior disc osteophyte complex with moderate left intraforaminal endplate spurring. Moderate left and mild right neuroforaminal stenosis. Moderate narrowing of the lateral recesses and moderate central canal stenosis. L5-S1: Moderate bilateral facet joint hypertrophy. Minimal broad-based posterior disc osteophyte complex with moderate right intraforaminal endplate spurring. Mild-to-moderate right neuroforaminal stenosis. No central canal stenosis. IMPRESSION: 1. Please see contemporaneous CT of the thoracic spine for description of an acute to subacute inferior T10 vertebral body compression fracture. 2. No lumbar vertebral body acute fracture. 3. Multilevel degenerative disc and joint changes as above. Mild L2-3, moderate to severe L3-4, and moderate L4-5 central canal stenosis. 4. Multilevel neuroforaminal stenosis as above. The greatest levels include moderate left L4-5 and mild-to-moderate right L5-S1 neuroforaminal stenosis. Electronically Signed   By: Yvonne Kendall M.D.   On: 12/16/2021 21:32   CT Thoracic Spine Wo Contrast  Result Date: 12/16/2021 CLINICAL DATA:  Back trauma.  Fall 3 days prior.  Hyponatremia. EXAM: CT THORACIC SPINE WITHOUT CONTRAST TECHNIQUE: Multidetector CT images of the thoracic were obtained using the standard protocol without intravenous contrast. RADIATION DOSE  REDUCTION: This exam was performed according to the departmental dose-optimization program which includes automated exposure control, adjustment of the mA and/or kV according to patient size and/or use of iterative reconstruction technique. COMPARISON:  Thoracic spine radiographs 12/14/2021 FINDINGS: Alignment: No sagittal spondylolisthesis. Vertebrae: There is minimal approximate 5% height loss of the mid T10 vertebral body. This was seen on recent 12/14/2021 radiographs. There is an acute to subacute horizontal linear fracture line extending from the anterior mid to inferior T10 vertebral body cortex through the posteroinferior vertebral body cortex. Mild 3 mm anterior and 3 mm posterior T10 vertebral body cortical buckling (sagittal series 6, image 23). There is approximate 30% height loss of the mid to anterior T4 vertebral body, similar to recent 12/15/2021 radiographs. There is chronic sclerosis within the T4 superior endplate. No acute fracture line is seen. No  significant retropulsion. Mild T3-4 through T6-7 disc space narrowing. Paraspinal and other soft tissues: Mild-to-moderate atherosclerotic calcifications within the partially visualized thoracic aorta. Mild curvilinear likely subsegmental atelectasis within the partially visualized medial aspect of the lungs. Coronary artery calcifications are noted. Disc levels: T10-11: Mild retropulsion of the posteroinferior T10 vertebral body. Mild bilateral facet joint hypertrophy. Moderate right and borderline mild left neuroforaminal stenosis. No central canal stenosis. IMPRESSION: 1. Acute to subacute T10 vertebral body compression fracture with minimal 5 mm height loss. There is mild posteroinferior T10 vertebral body retropulsion. Moderate right and borderline mild left T10-11 neuroforaminal stenosis. 2. Chronic appearing 30% height loss of the mid to anterior T4 vertebral body. Aortic Atherosclerosis (ICD10-I70.0). Electronically Signed   By: Yvonne Kendall  M.D.   On: 12/16/2021 21:20            LOS: 3 days       Emeterio Reeve, DO [facility] 12/19/2021, 4:12 PM   Staff may message me via secure chat in [place]  but this may not receive immediate response,  please page for urgent matters!  If 7PM-7AM, please contact night-coverage [email or web address]  Dictation software was used to generate the above note. Typos may occur and escape review, as with typed/written notes. Please contact Dr Sheppard Coil directly for clarity if needed.

## 2021-12-19 NOTE — Care Management Important Message (Signed)
Important Message  Patient Details  Name: Lisa Koch MRN: [id number] Date of Birth: [birth year]   Medicare Important Message Given:  Yes     Dannette Barbara 12/19/2021, 10:41 AM

## 2021-12-20 DIAGNOSIS — E871 Hypo-osmolality and hyponatremia: Secondary | ICD-10-CM | POA: Diagnosis not present

## 2021-12-20 DIAGNOSIS — K8020 Calculus of gallbladder without cholecystitis without obstruction: Secondary | ICD-10-CM | POA: Diagnosis not present

## 2021-12-20 DIAGNOSIS — S22070A Wedge compression fracture of T9-T10 vertebra, initial encounter for closed fracture: Secondary | ICD-10-CM | POA: Diagnosis not present

## 2021-12-20 DIAGNOSIS — G9341 Metabolic encephalopathy: Secondary | ICD-10-CM | POA: Diagnosis not present

## 2021-12-20 LAB — BASIC METABOLIC PANEL
Anion gap: 3 — ABNORMAL LOW (ref 5–15)
BUN: 9 mg/dL (ref 8–23)
CO2: 26 mmol/L (ref 22–32)
Calcium: 8.4 mg/dL — ABNORMAL LOW (ref 8.9–10.3)
Chloride: 105 mmol/L (ref 98–111)
Creatinine, Ser: 0.59 mg/dL (ref 0.44–1.00)
GFR, Estimated: >60 mL/min (ref >60)
Glucose, Bld: 106 mg/dL — ABNORMAL HIGH (ref 70–99)
Potassium: 4.1 mmol/L (ref 3.5–5.1)
Sodium: 134 mmol/L — ABNORMAL LOW (ref 135–145)

## 2021-12-20 LAB — SODIUM: Sodium: 134 mmol/L — ABNORMAL LOW (ref 135–145)

## 2021-12-20 MED ORDER — FLEET ENEMA 7-19 GM/118ML RE ENEM
1.0000 | ENEMA | Freq: Once | RECTAL | Status: AC
  Administered 2021-12-20: 1 via RECTAL

## 2021-12-20 MED ORDER — LABETALOL HCL 5 MG/ML IV SOLN
5.0000 mg | Freq: Once | INTRAVENOUS | Status: AC
  Administered 2021-12-20: 5 mg via INTRAVENOUS
  Filled 2021-12-20: qty 4

## 2021-12-20 MED ORDER — FOLIC ACID 1 MG PO TABS
2.0000 mg | ORAL_TABLET | Freq: Every day | ORAL | Status: DC
  Administered 2021-12-20 – 2021-12-22 (×3): 2 mg via ORAL
  Filled 2021-12-20 (×3): qty 2

## 2021-12-20 MED ORDER — SODIUM CHLORIDE 1 G PO TABS
1.0000 g | ORAL_TABLET | Freq: Three times a day (TID) | ORAL | Status: AC
  Administered 2021-12-20 (×2): 1 g via ORAL
  Filled 2021-12-20 (×2): qty 1

## 2021-12-20 MED ORDER — VERAPAMIL HCL ER 240 MG PO TBCR
240.0000 mg | EXTENDED_RELEASE_TABLET | Freq: Every day | ORAL | Status: DC
  Administered 2021-12-20 – 2021-12-22 (×3): 240 mg via ORAL
  Filled 2021-12-20 (×3): qty 1

## 2021-12-20 MED ORDER — SODIUM CHLORIDE 1 G PO TABS
1.0000 g | ORAL_TABLET | Freq: Three times a day (TID) | ORAL | Status: DC
  Administered 2021-12-20: 1 g via ORAL
  Filled 2021-12-20 (×2): qty 1

## 2021-12-20 MED ORDER — INFLUENZA VAC A&B SA ADJ QUAD 0.5 ML IM PRSY
0.5000 mL | PREFILLED_SYRINGE | INTRAMUSCULAR | Status: DC
  Filled 2021-12-20: qty 0.5

## 2021-12-20 NOTE — Progress Notes (Signed)
DME/BSC being delivered to the pt's room for the pt to take home with her at discharge; pt and her son verbally advised me that they need a rolling walker, what we have here for pt use is much better than what she has at home; sent request to Dr Sheppard Coil for DME/rolling walker

## 2021-12-20 NOTE — Progress Notes (Signed)
DME/rolling walker ordered by Dr Sheppard Coil; delivery pending to the pt's room

## 2021-12-20 NOTE — Progress Notes (Cosign Needed)
12/20/21: Patient is not able to walk the distance required to go the bathroom, or he/she is unable to safely negotiate stairs required to access the bathroom.  A 3in1 BSC will alleviate this problem Simmie Davies RN CM

## 2021-12-20 NOTE — Progress Notes (Signed)
PROGRESS NOTE    Lisa Koch   DPO:[id number] DOB: 09-24-1924  DOA: 12/16/2021 Date of Service: 12/20/21 PCP: Tracie Harrier, MD     Brief Narrative / Hospital Course:  Lisa Koch is a 88 y.o. female with medical history significant for HTN, seen in the ED on 10/01 with a fall with brief LOC, treated for UTI(culture with 10,000 urogenital flora) and discharged who was sent by her PCP for admission for hyponatremia.  Patient presented to her PCP for post ED follow-up and family was concerned about ongoing confusion, persistent nausea and persistent thoracic and lower back pain.  She had a CT scan on 10/one of her spine that showed chronic compression fractures.  They also noted that she had a slightly distended abdomen that she had not had a bowel movement in 3 days. 10/03: BP 171/67, pulse 60, respirations 21 with O2 sat 96% on room air and afebrile.  Labs significant for sodium 120, down from 132 on 10/01.  Bilirubin 3.1 up from 0.6 on 10/01.  WBC 13.5, about the same as 2 days ago in spite of treatment with Keflex for UTI.  Serum osmolality 252, urine osmolality 215. Trauma imaging including CT head, C-spine and abdomen and pelvis with mostly nonacute findings.  Notable findings included a T10 vertebral body compression fracture with 15% height loss, 1.4 cm gallstone impacted in the gallbladder neck without gallbladder wall thickening. Patient given sodium chloride bolus.  A TLSO brace was applied.  Hospitalist consulted for admission - admitted for acute metabolic encephalopathy secondary to hyponatremia, possibly postconcussive from fall, versus possible underlying infection.  Continued on home Keflex, urine recollected.  Continued on IV normal saline for hyponatremia.  Concern for cholelithiasis/elevated bilirubin, monitoring hepatic function and added on lipase.  Neurosurgery consulted: Maintain TLSO brace but okay to remove while in bed, PT/OT, arrange outpatient follow-up. 10/04:  bradycardic into 30s-40s per RN early AM, EKG no concerns, sinus. HR improved on rounds. Continue treat pain and hyponatremia.  Caused NS infusion due to concern for rapid correction, sodium was stable in the afternoon 128 10/05: Sodium on AM labs to 125, restarted NS --> 128 mid-day, recheck pending later this afternoon  10/06: Sodium to 131 then 134 today. Will trial reduce IV fluids and maintain on salt tabs.  10/07: Sodium stable at 134. D/c IV fluids. Remains stable in afternoon. Pt and family requesting DME for bedside commode, walker. Pt very anxious hasn't had BM in 2 days and feels constipated. Bowel regimen administered. If normal or stable sodium tomorrow will discharge.   Consultants:  Neurosurgery - no procedures planned  Procedures: none      ASSESSMENT & PLAN:   Principal Problem:   Acute metabolic encephalopathy Active Problems:   Acute hyponatremia   Cholelithiasis   HTN (hypertension)   Recent fall with LOC 12/14/21   Elevated bilirubin   Back pain secondary to closed wedge compression fracture of tenth thoracic vertebra (HCC)  Acute hyponatremia Hypotonic and suspect hypovolemic, also thiazide effect  IV NS bolus and continuous fluids after that have been done  D/c home HCTZ Serum osmolality 252, urine osmolality 215 and urine sodium 12 IV NS --> discontinued afternoon 12/17/2021 due to sodium going from 120-128 in less than 24 hours, recehck in PM --> stable at 128 --> AM 10/05 down to 125, resume NS --> 128 mid-day --> 131 AM today 10/06 and 134 later in afternoon --> Will trial reduce IV fluids rate, continue fluid restriction. --> D/c  fluids and stable sodium   Acute metabolic encephalopathy Secondary to acute hyponatremia, possible postconcussive from fall, unlikely underlying infection Mental status appears improved 12/17/21 alert/oriented  Neurologic checks, fall precautions Urine recollected, WNL no UTI  Cholelithiasis Elevated bilirubin Bilirubin  elevated at 1.4 above 0.6 a couple days prior patient symptomatic for nausea.  WBC elevated.  LFTs normal CT shows 1.4 cm gallstone appears impacted in the gallbladder neck but there is no gallbladder wall thickening. Continue to monitor hepatic function --> stable and WNL will add on lipase --> WNL  HTN (hypertension) Elevated BP with systolic in the 284X to 324M Continue lisinopril and verapamil DC HCTZ given hyponatremia  Back pain secondary to closed wedge compression fracture of tenth thoracic vertebra (HCC) Continue TLSO brace while OOB Pain control PT/OT - no follow-up needed Outpatient follow-up with neurosurgery  Recent fall with LOC 12/14/21 Etiology of fall likely d/t hyponatremia/weakness.   Patient has improving occipitotemporal scalp hematoma without intracranial injury Fall precautions PT/OT eval - no follow-up needed  Neurologic checks can d/c  Nasal Congestion Family requesting something to help patient cough up "frog in her throat" DuoNeb x1 Saline nasal spry     DVT prophylaxis: lovenox Pertinent IV fluids/nutrition: NS treating hyponatremia Central lines / invasive devices: none  Code Status: DNR Family Communication: daughter in law at bedside on rounds   Disposition: inpatient TOC needs: DME Barriers to discharge / significant pending items: DME             Subjective:  Patient reports pain is controlled, very concerned about constipation. No abdominal pain or nausea.       Objective:  Vitals:   12/20/21 0622 12/20/21 0651 12/20/21 0853 12/20/21 1538  BP: (!) 194/79 (!) 184/85 (!) 186/79 139/64  Pulse: 75 82 65 70  Resp: '16  17 17  '$ Temp: 98.4 F (36.9 C)  98.4 F (36.9 C) 98.6 F (37 C)  TempSrc:   Oral   SpO2: 95%  98% 95%  Weight:      Height:        Intake/Output Summary (Last 24 hours) at 12/20/2021 1657 Last data filed at 12/20/2021 0814 Gross per 24 hour  Intake 3141.5 ml  Output 1650 ml  Net 1491.5 ml    Filed Weights   12/16/21 1944  Weight: 60 kg    Examination:  Constitutional:  VS as above General Appearance: alert, NAD. Very hard of hearing.  Ears, Nose, Mouth, Throat: Normal external appearance MMM Neck: No masses, trachea midline Respiratory: - exam limited d/t brace Normal respiratory effort No wheeze No rhonchi No rales Cardiovascular: S1/S2 normal RRR No murmur No rub/gallop auscultated Gastrointestinal: No tenderness Musculoskeletal:  No clubbing/cyanosis of digits Symmetrical movement in all extremities Neurological: No cranial nerve deficit on limited exam Alert, oriented Psychiatric: Normal judgment/insight Pleasant mood and affect       Scheduled Medications:   enoxaparin (LOVENOX) injection  40 mg Subcutaneous W10U   folic acid  2 mg Oral Daily   [START ON 12/21/2021] influenza vaccine adjuvanted  0.5 mL Intramuscular Tomorrow-1000   lisinopril  40 mg Oral Daily   [START ON 12/22/2021] methotrexate  10 mg Oral Weekly   pantoprazole  40 mg Oral Daily   potassium chloride  20 mEq Oral q1800   verapamil  240 mg Oral Daily    Continuous Infusions:     PRN Medications:  acetaminophen **OR** acetaminophen, HYDROcodone-acetaminophen, ondansetron **OR** ondansetron (ZOFRAN) IV, sodium chloride  Antimicrobials:  Anti-infectives (From admission,  onward)    None       Data Reviewed: I have personally reviewed following labs and imaging studies  CBC: Recent Labs  Lab 12/14/21 0022 12/16/21 1953 12/17/21 0400  WBC 13.9* 13.5* 9.5  NEUTROABS 11.1* 11.1*  --   HGB 12.7 12.2 11.7*  HCT 37.7 34.8* 33.1*  MCV 96.9 93.3 93.8  PLT 200 196 469   Basic Metabolic Panel: Recent Labs  Lab 12/16/21 1953 12/17/21 0400 12/17/21 1049 12/17/21 1849 12/18/21 0523 12/18/21 1155 12/18/21 1814 12/19/21 0725 12/19/21 1521 12/20/21 0619 12/20/21 1323  NA 120* 125* 128*   < > 125*   < > 128* 131* 134* 134* 134*  K 4.3 3.9 3.7  --  4.0  --    --   --   --  4.1  --   CL 85* 93* 97*  --  94*  --   --   --   --  105  --   CO2 '24 26 24  '$ --  22  --   --   --   --  26  --   GLUCOSE 106* 95 146*  --  136*  --   --   --   --  106*  --   BUN '17 14 13  '$ --  14  --   --   --   --  9  --   CREATININE 0.68 0.68 0.68  --  0.69  --   --   --   --  0.59  --   CALCIUM 10.1 9.3 9.0  --  9.1  --   --   --   --  8.4*  --    < > = values in this interval not displayed.   GFR: Estimated Creatinine Clearance: 34.1 mL/min (by C-G formula based on SCr of 0.59 mg/dL). Liver Function Tests: Recent Labs  Lab 12/14/21 0022 12/16/21 1953 12/17/21 0400  AST '28 27 24  '$ ALT '25 27 25  '$ ALKPHOS 68 53 50  BILITOT 0.6 1.3* 1.3*  PROT 6.2* 6.0* 5.5*  ALBUMIN 3.8 3.5 3.4*   Recent Labs  Lab 12/14/21 0022 12/17/21 0400  LIPASE 47 34   No results for input(s): "AMMONIA" in the last 168 hours. Coagulation Profile: No results for input(s): "INR", "PROTIME" in the last 168 hours. Cardiac Enzymes: No results for input(s): "CKTOTAL", "CKMB", "CKMBINDEX", "TROPONINI" in the last 168 hours. BNP (last 3 results) No results for input(s): "PROBNP" in the last 8760 hours. HbA1C: No results for input(s): "HGBA1C" in the last 72 hours. CBG: No results for input(s): "GLUCAP" in the last 168 hours. Lipid Profile: No results for input(s): "CHOL", "HDL", "LDLCALC", "TRIG", "CHOLHDL", "LDLDIRECT" in the last 72 hours. Thyroid Function Tests: No results for input(s): "TSH", "T4TOTAL", "FREET4", "T3FREE", "THYROIDAB" in the last 72 hours. Anemia Panel: No results for input(s): "VITAMINB12", "FOLATE", "FERRITIN", "TIBC", "IRON", "RETICCTPCT" in the last 72 hours. Urine analysis:    Component Value Date/Time   COLORURINE STRAW (A) 12/16/2021 2349   APPEARANCEUR CLEAR (A) 12/16/2021 2349   LABSPEC 1.013 12/16/2021 2349   PHURINE 6.0 12/16/2021 2349   GLUCOSEU NEGATIVE 12/16/2021 2349   HGBUR NEGATIVE 12/16/2021 2349   BILIRUBINUR NEGATIVE 12/16/2021 2349    KETONESUR 20 (A) 12/16/2021 2349   PROTEINUR NEGATIVE 12/16/2021 2349   NITRITE NEGATIVE 12/16/2021 2349   LEUKOCYTESUR NEGATIVE 12/16/2021 2349   Sepsis Labs: '@LABRCNTIP'$ (procalcitonin:4,lacticidven:4)  Recent Results (from the past 240 hour(s))  Urine Culture  Status: Abnormal   Collection Time: 12/14/21  1:40 AM   Specimen: Urine, Clean Catch  Result Value Ref Range Status   Specimen Description   Final    URINE, CLEAN CATCH Performed at [facility], [address] [place], [place] [address]    Special Requests   Final    NONE Performed at [facility], [address] [place], [place] [address]    Culture (A)  Final    10,000 COLONIES/mL MULTIPLE SPECIES PRESENT, SUGGEST RECOLLECTION   Report Status 12/15/2021 FINAL  Final  Blood culture (single)     Status: None (Preliminary result)   Collection Time: 12/16/21  7:54 PM   Specimen: BLOOD  Result Value Ref Range Status   Specimen Description BLOOD LEFT FOREARM  Final   Special Requests   Final    BOTTLES DRAWN AEROBIC AND ANAEROBIC Blood Culture results may not be optimal due to an inadequate volume of blood received in culture bottles   Culture   Final    NO GROWTH 4 DAYS Performed at [facility], [address] [place],  [address]    Report Status PENDING  Incomplete         Radiology Studies: CT HEAD WO CONTRAST (5MM)  Result Date: 12/16/2021 CLINICAL DATA:  [place] 3 days ago, with hyponatremia and confusion. Neck pain. EXAM: CT HEAD WITHOUT CONTRAST CT CERVICAL SPINE WITHOUT CONTRAST CT ABDOMEN AND PELVIS WITH CONTRAST TECHNIQUE: Contiguous axial images were obtained from the base of the skull through the vertex without intravenous contrast. Multidetector CT imaging of the cervical spine was performed without intravenous contrast. Multiplanar CT image reconstructions were also generated. Multidetector CT imaging of the abdomen and pelvis was performed using the standard  protocol following bolus administration of intravenous contrast. RADIATION DOSE REDUCTION: This exam was performed according to the departmental dose-optimization program which includes automated exposure control, adjustment of the mA and/or kV according to patient size and/or use of iterative reconstruction technique. CONTRAST:  56m OMNIPAQUE IOHEXOL 300 MG/ML  SOLN COMPARISON:  Head CT and cervical spine CT both 12/14/2021. No prior abdomen pelvis CT. FINDINGS: CT HEAD FINDINGS Brain: There is moderately advanced cerebral atrophy with atrophic ventriculomegaly and mild cerebellar atrophy. There is moderate to severe small vessel disease of the cerebral white matter. No asymmetry is seen concerning for an acute infarct, hemorrhage or mass. There is no midline shift. Basal cisterns are clear. Vascular: There are calcifications in the carotid siphons. No hyperdense central vessel is seen. Skull: Negative for fractures or focal lesions. 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 There is a small right occipitotemporal scalp hematoma which was larger previously. No new scalp hematoma is seen. Sinuses/Orbits: Old lens replacements. Visualized sinuses, bilateral mastoid air cells are clear. Other: None. CT CERVICAL SPINE FINDINGS Alignment: Stable findings. Reversed lordosis again noted centered at C5 and 2 mm grade 1 anterolisthesis C2-3, C3-4 and C4-5, trace retrolisthesis C5-6 and C6-7 all likely degenerative etiology. There is no widening of the anterior atlantodental joint. There is spurring along the C1-2 lateral mass articulations. Spurring of the anterior atlantodental joint. Skull base and vertebrae: Osteopenia. No fracture or pathologic bone lesion is seen. Soft tissues and spinal canal: No prevertebral fluid or swelling. No visible canal hematoma. A small left dorsal epidural calcified meningioma is again noted at C4-5, but there is no associated mass effect. Disc levels: There is advanced degenerative disc collapse with  bidirectional osteophytes at C5-6 and C6-7, posterior disc osteophyte complexes flattening of the  ventral cord surface at these levels. Other levels show lesser degenerative disc space loss minimal endplate spurring without mass effect. There is multilevel facet joint and uncinate hypertrophy with facet joint ankylosis on the left at C3-4. Degenerative foraminal stenosis is greatest on the left at C2-3 and C3-4, and on both sides at C4-5, and on the left again at C5-6 and C6-7. Upper chest: Negative. Other: None. CT ABDOMEN AND PELVIS FINDINGS Lower chest: There are atelectatic bands in the lower lobes and in the elevated right hemidiaphragm. There is an irregular 8 mm noncalcified nodule medially in the right middle lobe on series 4 axial image 2. Remaining lung bases are clear. There are scattered calcifications in the aortic valve leaflets and coronary arteries, mild cardiomegaly. Hepatobiliary: Breathing motion limits evaluation. There is no obvious mass. There is a 1.4 cm stone in the gallbladder neck, which may be impacted but there is no gallbladder thickening or biliary dilatation. Pancreas: No focal abnormality is seen through the breathing motion. Spleen: No focal abnormality is seen through the breathing motion. No splenomegaly. Adrenals/Urinary Tract: There is no adrenal mass. There is a wedge-shaped cortical scar laterally in the upper pole left kidney. There is a homogeneous 2.1 cm thin walled cyst in the upper pole of the right kidney or 14.1 Hounsfield units, 9 mm low-density lesion medial to this in the right upper pole which is too small to characterize. No imaging follow-up is recommended. Remainder of both kidneys are unremarkable. There is no urinary stone or obstruction. There is no bladder thickening. The ureters insert low in the pelvis indicating pelvic floor laxity but there is no cystocele. Stomach/Bowel: The stomach is contracted. The small bowel is normal in caliber. An appendix is not  seen in this patient. There is moderate stool retention particularly in the cecum and ascending colon, proximal transverse segment with left-sided diverticulosis without evidence of diverticulitis. Diverticulosis most advanced in the sigmoid segment. There is a small rectocele protruding just below the level of the pelvic floor. Vascular/Lymphatic: Aortic atherosclerosis. No enlarged abdominal or pelvic lymph nodes. Branch artery calcifications are noted, greatest in the renal artery ostia. Reproductive: Surgically absent uterus. The left ovary is unremarkable. The right ovary demonstrates a 5 cm thin walled homogeneous simple appearing cyst of 13.2 Hounsfield units. No adnexal solid mass is seen. Other: There is no free air, free hemorrhage or free fluid. There are small umbilical fat hernias. There is no incarcerated abdominal wall hernia. Multiple pelvic phleboliths. Musculoskeletal: There is osteopenia. There is a compression fracture of the T10 vertebral body, with transverse oblique linear fracture line through the inferior vertebral body, buckling along the anterior and posterior cortex, trace retropulsion of the posteroinferior cortex and approximately 15% loss of the overall vertebral body height. There is no extension into the pedicles and posterior elements. There is no associated herniated disc. There are degenerative changes of the lumbar spine greatest at L4-5 and L5-S1. Mild hip DJD and chondrocalcinosis. IMPRESSION: 1. No acute intracranial CT findings or depressed skull fractures. Calvarial osteopenia. 2. Small right occipitotemporal scalp hematoma, smaller than 2 days ago. 3. Osteopenia, degenerative changes, reversed lordosis in the cervical spine without evidence of fractures or traumatic listhesis. 4. Lower plate transverse oblique compression fracture of the T10 vertebral body with up to 15% loss of the overall vertebral body height, with cortical buckling anteriorly and posteriorly and trace  retropulsion. No spinal canal hematoma is seen. 5. 8 mm irregular nodule in the right middle lobe. The usual  recommendation per Fleischner guidelines would be for 6-12 month follow-up CT to ensure stability, with additional CT depending on whether or not the patient is high risk. In this case, follow-up imaging should be considered in light of the patient's advanced age and likelihood of intervention if the nodule shows growth. 6. 5 cm simple cyst right ovary. Three-month follow-up ultrasound for reassessment/recharacterization is suggested, taking into account advanced age. 7. No other acute CT findings. Chronic changes including constipation and diverticulosis. 8. Renal cysts and scarring with heavy calcification in the renal artery ostia. 9. 1.4 cm gallstone appears impacted in the gallbladder neck but there is no gallbladder wall thickening. 10. Aortic atherosclerosis. 11. Small rectocele protruding below the level of the pelvic floor. Additional evidence of pelvic floor laxity with low pelvic ureteral insertions but no cystocele. Electronically Signed   By: Telford Nab M.D.   On: 12/16/2021 21:36   CT Cervical Spine Wo Contrast  Result Date: 12/16/2021 CLINICAL DATA:  [place] 3 days ago, with hyponatremia and confusion. Neck pain. EXAM: CT HEAD WITHOUT CONTRAST CT CERVICAL SPINE WITHOUT CONTRAST CT ABDOMEN AND PELVIS WITH CONTRAST TECHNIQUE: Contiguous axial images were obtained from the base of the skull through the vertex without intravenous contrast. Multidetector CT imaging of the cervical spine was performed without intravenous contrast. Multiplanar CT image reconstructions were also generated. Multidetector CT imaging of the abdomen and pelvis was performed using the standard protocol following bolus administration of intravenous contrast. RADIATION DOSE REDUCTION: This exam was performed according to the departmental dose-optimization program which includes automated exposure control, adjustment of  the mA and/or kV according to patient size and/or use of iterative reconstruction technique. CONTRAST:  20m OMNIPAQUE IOHEXOL 300 MG/ML  SOLN COMPARISON:  Head CT and cervical spine CT both 12/14/2021. No prior abdomen pelvis CT. FINDINGS: CT HEAD FINDINGS Brain: There is moderately advanced cerebral atrophy with atrophic ventriculomegaly and mild cerebellar atrophy. There is moderate to severe small vessel disease of the cerebral white matter. No asymmetry is seen concerning for an acute infarct, hemorrhage or mass. There is no midline shift. Basal cisterns are clear. Vascular: There are calcifications in the carotid siphons. No hyperdense central vessel is seen. Skull: Negative for fractures or focal lesions. 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 There is a small right occipitotemporal scalp hematoma which was larger previously. No new scalp hematoma is seen. Sinuses/Orbits: Old lens replacements. Visualized sinuses, bilateral mastoid air cells are clear. Other: None. CT CERVICAL SPINE FINDINGS Alignment: Stable findings. Reversed lordosis again noted centered at C5 and 2 mm grade 1 anterolisthesis C2-3, C3-4 and C4-5, trace retrolisthesis C5-6 and C6-7 all likely degenerative etiology. There is no widening of the anterior atlantodental joint. There is spurring along the C1-2 lateral mass articulations. Spurring of the anterior atlantodental joint. Skull base and vertebrae: Osteopenia. No fracture or pathologic bone lesion is seen. Soft tissues and spinal canal: No prevertebral fluid or swelling. No visible canal hematoma. A small left dorsal epidural calcified meningioma is again noted at C4-5, but there is no associated mass effect. Disc levels: There is advanced degenerative disc collapse with bidirectional osteophytes at C5-6 and C6-7, posterior disc osteophyte complexes flattening of the ventral cord surface at these levels. Other levels show lesser degenerative disc space loss minimal endplate spurring without  mass effect. There is multilevel facet joint and uncinate hypertrophy with facet joint ankylosis on the left at C3-4. Degenerative foraminal stenosis is greatest on the left at C2-3 and C3-4, and on both sides at C4-5,  and on the left again at C5-6 and C6-7. Upper chest: Negative. Other: None. CT ABDOMEN AND PELVIS FINDINGS Lower chest: There are atelectatic bands in the lower lobes and in the elevated right hemidiaphragm. There is an irregular 8 mm noncalcified nodule medially in the right middle lobe on series 4 axial image 2. Remaining lung bases are clear. There are scattered calcifications in the aortic valve leaflets and coronary arteries, mild cardiomegaly. Hepatobiliary: Breathing motion limits evaluation. There is no obvious mass. There is a 1.4 cm stone in the gallbladder neck, which may be impacted but there is no gallbladder thickening or biliary dilatation. Pancreas: No focal abnormality is seen through the breathing motion. Spleen: No focal abnormality is seen through the breathing motion. No splenomegaly. Adrenals/Urinary Tract: There is no adrenal mass. There is a wedge-shaped cortical scar laterally in the upper pole left kidney. There is a homogeneous 2.1 cm thin walled cyst in the upper pole of the right kidney or 14.1 Hounsfield units, 9 mm low-density lesion medial to this in the right upper pole which is too small to characterize. No imaging follow-up is recommended. Remainder of both kidneys are unremarkable. There is no urinary stone or obstruction. There is no bladder thickening. The ureters insert low in the pelvis indicating pelvic floor laxity but there is no cystocele. Stomach/Bowel: The stomach is contracted. The small bowel is normal in caliber. An appendix is not seen in this patient. There is moderate stool retention particularly in the cecum and ascending colon, proximal transverse segment with left-sided diverticulosis without evidence of diverticulitis. Diverticulosis most  advanced in the sigmoid segment. There is a small rectocele protruding just below the level of the pelvic floor. Vascular/Lymphatic: Aortic atherosclerosis. No enlarged abdominal or pelvic lymph nodes. Branch artery calcifications are noted, greatest in the renal artery ostia. Reproductive: Surgically absent uterus. The left ovary is unremarkable. The right ovary demonstrates a 5 cm thin walled homogeneous simple appearing cyst of 13.2 Hounsfield units. No adnexal solid mass is seen. Other: There is no free air, free hemorrhage or free fluid. There are small umbilical fat hernias. There is no incarcerated abdominal wall hernia. Multiple pelvic phleboliths. Musculoskeletal: There is osteopenia. There is a compression fracture of the T10 vertebral body, with transverse oblique linear fracture line through the inferior vertebral body, buckling along the anterior and posterior cortex, trace retropulsion of the posteroinferior cortex and approximately 15% loss of the overall vertebral body height. There is no extension into the pedicles and posterior elements. There is no associated herniated disc. There are degenerative changes of the lumbar spine greatest at L4-5 and L5-S1. Mild hip DJD and chondrocalcinosis. IMPRESSION: 1. No acute intracranial CT findings or depressed skull fractures. Calvarial osteopenia. 2. Small right occipitotemporal scalp hematoma, smaller than 2 days ago. 3. Osteopenia, degenerative changes, reversed lordosis in the cervical spine without evidence of fractures or traumatic listhesis. 4. Lower plate transverse oblique compression fracture of the T10 vertebral body with up to 15% loss of the overall vertebral body height, with cortical buckling anteriorly and posteriorly and trace retropulsion. No spinal canal hematoma is seen. 5. 8 mm irregular nodule in the right middle lobe. The usual recommendation per Fleischner guidelines would be for 6-12 month follow-up CT to ensure stability, with  additional CT depending on whether or not the patient is high risk. In this case, follow-up imaging should be considered in light of the patient's advanced age and likelihood of intervention if the nodule shows growth. 6. 5 cm simple  cyst right ovary. Three-month follow-up ultrasound for reassessment/recharacterization is suggested, taking into account advanced age. 7. No other acute CT findings. Chronic changes including constipation and diverticulosis. 8. Renal cysts and scarring with heavy calcification in the renal artery ostia. 9. 1.4 cm gallstone appears impacted in the gallbladder neck but there is no gallbladder wall thickening. 10. Aortic atherosclerosis. 11. Small rectocele protruding below the level of the pelvic floor. Additional evidence of pelvic floor laxity with low pelvic ureteral insertions but no cystocele. Electronically Signed   By: Telford Nab M.D.   On: 12/16/2021 21:36   CT ABDOMEN PELVIS W CONTRAST  Result Date: 12/16/2021 CLINICAL DATA:  [place] 3 days ago, with hyponatremia and confusion. Neck pain. EXAM: CT HEAD WITHOUT CONTRAST CT CERVICAL SPINE WITHOUT CONTRAST CT ABDOMEN AND PELVIS WITH CONTRAST TECHNIQUE: Contiguous axial images were obtained from the base of the skull through the vertex without intravenous contrast. Multidetector CT imaging of the cervical spine was performed without intravenous contrast. Multiplanar CT image reconstructions were also generated. Multidetector CT imaging of the abdomen and pelvis was performed using the standard protocol following bolus administration of intravenous contrast. RADIATION DOSE REDUCTION: This exam was performed according to the departmental dose-optimization program which includes automated exposure control, adjustment of the mA and/or kV according to patient size and/or use of iterative reconstruction technique. CONTRAST:  76m OMNIPAQUE IOHEXOL 300 MG/ML  SOLN COMPARISON:  Head CT and cervical spine CT both 12/14/2021. No prior  abdomen pelvis CT. FINDINGS: CT HEAD FINDINGS Brain: There is moderately advanced cerebral atrophy with atrophic ventriculomegaly and mild cerebellar atrophy. There is moderate to severe small vessel disease of the cerebral white matter. No asymmetry is seen concerning for an acute infarct, hemorrhage or mass. There is no midline shift. Basal cisterns are clear. Vascular: There are calcifications in the carotid siphons. No hyperdense central vessel is seen. Skull: Negative for fractures or focal lesions. 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 There is a small right occipitotemporal scalp hematoma which was larger previously. No new scalp hematoma is seen. Sinuses/Orbits: Old lens replacements. Visualized sinuses, bilateral mastoid air cells are clear. Other: None. CT CERVICAL SPINE FINDINGS Alignment: Stable findings. Reversed lordosis again noted centered at C5 and 2 mm grade 1 anterolisthesis C2-3, C3-4 and C4-5, trace retrolisthesis C5-6 and C6-7 all likely degenerative etiology. There is no widening of the anterior atlantodental joint. There is spurring along the C1-2 lateral mass articulations. Spurring of the anterior atlantodental joint. Skull base and vertebrae: Osteopenia. No fracture or pathologic bone lesion is seen. Soft tissues and spinal canal: No prevertebral fluid or swelling. No visible canal hematoma. A small left dorsal epidural calcified meningioma is again noted at C4-5, but there is no associated mass effect. Disc levels: There is advanced degenerative disc collapse with bidirectional osteophytes at C5-6 and C6-7, posterior disc osteophyte complexes flattening of the ventral cord surface at these levels. Other levels show lesser degenerative disc space loss minimal endplate spurring without mass effect. There is multilevel facet joint and uncinate hypertrophy with facet joint ankylosis on the left at C3-4. Degenerative foraminal stenosis is greatest on the left at C2-3 and C3-4, and on both sides at  C4-5, and on the left again at C5-6 and C6-7. Upper chest: Negative. Other: None. CT ABDOMEN AND PELVIS FINDINGS Lower chest: There are atelectatic bands in the lower lobes and in the elevated right hemidiaphragm. There is an irregular 8 mm noncalcified nodule medially in the right middle lobe on series 4 axial image 2.  Remaining lung bases are clear. There are scattered calcifications in the aortic valve leaflets and coronary arteries, mild cardiomegaly. Hepatobiliary: Breathing motion limits evaluation. There is no obvious mass. There is a 1.4 cm stone in the gallbladder neck, which may be impacted but there is no gallbladder thickening or biliary dilatation. Pancreas: No focal abnormality is seen through the breathing motion. Spleen: No focal abnormality is seen through the breathing motion. No splenomegaly. Adrenals/Urinary Tract: There is no adrenal mass. There is a wedge-shaped cortical scar laterally in the upper pole left kidney. There is a homogeneous 2.1 cm thin walled cyst in the upper pole of the right kidney or 14.1 Hounsfield units, 9 mm low-density lesion medial to this in the right upper pole which is too small to characterize. No imaging follow-up is recommended. Remainder of both kidneys are unremarkable. There is no urinary stone or obstruction. There is no bladder thickening. The ureters insert low in the pelvis indicating pelvic floor laxity but there is no cystocele. Stomach/Bowel: The stomach is contracted. The small bowel is normal in caliber. An appendix is not seen in this patient. There is moderate stool retention particularly in the cecum and ascending colon, proximal transverse segment with left-sided diverticulosis without evidence of diverticulitis. Diverticulosis most advanced in the sigmoid segment. There is a small rectocele protruding just below the level of the pelvic floor. Vascular/Lymphatic: Aortic atherosclerosis. No enlarged abdominal or pelvic lymph nodes. Branch artery  calcifications are noted, greatest in the renal artery ostia. Reproductive: Surgically absent uterus. The left ovary is unremarkable. The right ovary demonstrates a 5 cm thin walled homogeneous simple appearing cyst of 13.2 Hounsfield units. No adnexal solid mass is seen. Other: There is no free air, free hemorrhage or free fluid. There are small umbilical fat hernias. There is no incarcerated abdominal wall hernia. Multiple pelvic phleboliths. Musculoskeletal: There is osteopenia. There is a compression fracture of the T10 vertebral body, with transverse oblique linear fracture line through the inferior vertebral body, buckling along the anterior and posterior cortex, trace retropulsion of the posteroinferior cortex and approximately 15% loss of the overall vertebral body height. There is no extension into the pedicles and posterior elements. There is no associated herniated disc. There are degenerative changes of the lumbar spine greatest at L4-5 and L5-S1. Mild hip DJD and chondrocalcinosis. IMPRESSION: 1. No acute intracranial CT findings or depressed skull fractures. Calvarial osteopenia. 2. Small right occipitotemporal scalp hematoma, smaller than 2 days ago. 3. Osteopenia, degenerative changes, reversed lordosis in the cervical spine without evidence of fractures or traumatic listhesis. 4. Lower plate transverse oblique compression fracture of the T10 vertebral body with up to 15% loss of the overall vertebral body height, with cortical buckling anteriorly and posteriorly and trace retropulsion. No spinal canal hematoma is seen. 5. 8 mm irregular nodule in the right middle lobe. The usual recommendation per Fleischner guidelines would be for 6-12 month follow-up CT to ensure stability, with additional CT depending on whether or not the patient is high risk. In this case, follow-up imaging should be considered in light of the patient's advanced age and likelihood of intervention if the nodule shows growth. 6.  5 cm simple cyst right ovary. Three-month follow-up ultrasound for reassessment/recharacterization is suggested, taking into account advanced age. 7. No other acute CT findings. Chronic changes including constipation and diverticulosis. 8. Renal cysts and scarring with heavy calcification in the renal artery ostia. 9. 1.4 cm gallstone appears impacted in the gallbladder neck but there is no gallbladder  wall thickening. 10. Aortic atherosclerosis. 11. Small rectocele protruding below the level of the pelvic floor. Additional evidence of pelvic floor laxity with low pelvic ureteral insertions but no cystocele. Electronically Signed   By: Telford Nab M.D.   On: 12/16/2021 21:36   CT L-SPINE NO CHARGE  Result Date: 12/16/2021 CLINICAL DATA:  Fall 3 days prior.  Hyponatremia. EXAM: CT LUMBAR SPINE WITHOUT CONTRAST TECHNIQUE: Multidetector CT imaging of the lumbar spine was performed without intravenous contrast administration. Multiplanar CT image reconstructions were also generated. RADIATION DOSE REDUCTION: This exam was performed according to the departmental dose-optimization program which includes automated exposure control, adjustment of the mA and/or kV according to patient size and/or use of iterative reconstruction technique. COMPARISON:  Lumbar spine radiographs 12/14/2021 FINDINGS: Segmentation: There are 5 non-rib-bearing lumbar-type vertebral bodies. Alignment: No sagittal spondylolisthesis. Minimal right lateral listhesis of L4 on L5. No significant scoliotic curvature. Vertebrae: No lumbar vertebral body height loss. Please see contemporaneous CT thoracic spine report for evaluation of acute to subacute T10 vertebral body compression fracture. Severe left-greater-than-right L4-5 and severe right and moderate left L5-S1 disc space narrowing, endplate sclerosis, and peripheral osteophytosis. Paraspinal and other soft tissues: Moderate atherosclerotic calcifications within the abdominal aorta and iliac  arteries. Tiny low density lesion within the partially visualized anterior right kidney measuring up to 11 x 9 mm, too small to further characterize but statistically most likely a cyst. No follow-up imaging is recommended. Minimally partially visualized distal colonic diverticulosis. Disc levels: L1-2: Mild-to-moderate bilateral facet joint hypertrophy. No central canal or neuroforaminal stenosis. L2-3: Mild-to-moderate bilateral facet joint hypertrophy. Mild posterior endplate spurring. Mild left-greater-than-right neuroforaminal stenosis. Moderate narrowing of the lateral recesses and mild central canal stenosis. L3-4: Moderate bilateral facet joint hypertrophy. Mild broad-based posterior disc osteophyte complex. Borderline mild right neuroforaminal stenosis. Moderate to severe central canal stenosis. L4-5: Moderate bilateral facet joint hypertrophy. Mild broad-based posterior disc osteophyte complex with moderate left intraforaminal endplate spurring. Moderate left and mild right neuroforaminal stenosis. Moderate narrowing of the lateral recesses and moderate central canal stenosis. L5-S1: Moderate bilateral facet joint hypertrophy. Minimal broad-based posterior disc osteophyte complex with moderate right intraforaminal endplate spurring. Mild-to-moderate right neuroforaminal stenosis. No central canal stenosis. IMPRESSION: 1. Please see contemporaneous CT of the thoracic spine for description of an acute to subacute inferior T10 vertebral body compression fracture. 2. No lumbar vertebral body acute fracture. 3. Multilevel degenerative disc and joint changes as above. Mild L2-3, moderate to severe L3-4, and moderate L4-5 central canal stenosis. 4. Multilevel neuroforaminal stenosis as above. The greatest levels include moderate left L4-5 and mild-to-moderate right L5-S1 neuroforaminal stenosis. Electronically Signed   By: Yvonne Kendall M.D.   On: 12/16/2021 21:32   CT Thoracic Spine Wo Contrast  Result Date:  12/16/2021 CLINICAL DATA:  Back trauma.  Fall 3 days prior.  Hyponatremia. EXAM: CT THORACIC SPINE WITHOUT CONTRAST TECHNIQUE: Multidetector CT images of the thoracic were obtained using the standard protocol without intravenous contrast. RADIATION DOSE REDUCTION: This exam was performed according to the departmental dose-optimization program which includes automated exposure control, adjustment of the mA and/or kV according to patient size and/or use of iterative reconstruction technique. COMPARISON:  Thoracic spine radiographs 12/14/2021 FINDINGS: Alignment: No sagittal spondylolisthesis. Vertebrae: There is minimal approximate 5% height loss of the mid T10 vertebral body. This was seen on recent 12/14/2021 radiographs. There is an acute to subacute horizontal linear fracture line extending from the anterior mid to inferior T10 vertebral body cortex through the posteroinferior vertebral body  cortex. Mild 3 mm anterior and 3 mm posterior T10 vertebral body cortical buckling (sagittal series 6, image 23). There is approximate 30% height loss of the mid to anterior T4 vertebral body, similar to recent 12/15/2021 radiographs. There is chronic sclerosis within the T4 superior endplate. No acute fracture line is seen. No significant retropulsion. Mild T3-4 through T6-7 disc space narrowing. Paraspinal and other soft tissues: Mild-to-moderate atherosclerotic calcifications within the partially visualized thoracic aorta. Mild curvilinear likely subsegmental atelectasis within the partially visualized medial aspect of the lungs. Coronary artery calcifications are noted. Disc levels: T10-11: Mild retropulsion of the posteroinferior T10 vertebral body. Mild bilateral facet joint hypertrophy. Moderate right and borderline mild left neuroforaminal stenosis. No central canal stenosis. IMPRESSION: 1. Acute to subacute T10 vertebral body compression fracture with minimal 5 mm height loss. There is mild posteroinferior T10  vertebral body retropulsion. Moderate right and borderline mild left T10-11 neuroforaminal stenosis. 2. Chronic appearing 30% height loss of the mid to anterior T4 vertebral body. Aortic Atherosclerosis (ICD10-I70.0). Electronically Signed   By: Yvonne Kendall M.D.   On: 12/16/2021 21:20            LOS: 4 days       Emeterio Reeve, DO [facility] 12/20/2021, 4:57 PM   Staff may message me via secure chat in [place]  but this may not receive immediate response,  please page for urgent matters!  If 7PM-7AM, please contact night-coverage [email or web address]  Dictation software was used to generate the above note. Typos may occur and escape review, as with typed/written notes. Please contact Dr Sheppard Coil directly for clarity if needed.

## 2021-12-20 NOTE — TOC Progression Note (Signed)
Transition of Care [facility]) - Progression Note    Patient Details  Name: Lisa Koch MRN: [id number] Date of Birth: 1924/03/21  [facility]) CM/SW Contact  Izola Price, RN Phone Number: 12/20/2021, 2:57 PM  Clinical Narrative:  10/7: 3:1 Narrative placed for DME order. Simmie Davies RN CM           Expected Discharge Plan and Services                                                 Social Determinants of Health (SDOH) Interventions    Readmission Risk Interventions     No data to display

## 2021-12-21 DIAGNOSIS — S22070A Wedge compression fracture of T9-T10 vertebra, initial encounter for closed fracture: Secondary | ICD-10-CM | POA: Diagnosis not present

## 2021-12-21 DIAGNOSIS — K8020 Calculus of gallbladder without cholecystitis without obstruction: Secondary | ICD-10-CM | POA: Diagnosis not present

## 2021-12-21 DIAGNOSIS — E871 Hypo-osmolality and hyponatremia: Secondary | ICD-10-CM | POA: Diagnosis not present

## 2021-12-21 DIAGNOSIS — G9341 Metabolic encephalopathy: Secondary | ICD-10-CM | POA: Diagnosis not present

## 2021-12-21 LAB — BASIC METABOLIC PANEL
Anion gap: 6 (ref 5–15)
BUN: 11 mg/dL (ref 8–23)
CO2: 25 mmol/L (ref 22–32)
Calcium: 9 mg/dL (ref 8.9–10.3)
Chloride: 100 mmol/L (ref 98–111)
Creatinine, Ser: 0.61 mg/dL (ref 0.44–1.00)
GFR, Estimated: >60 mL/min (ref >60)
Glucose, Bld: 101 mg/dL — ABNORMAL HIGH (ref 70–99)
Potassium: 4.1 mmol/L (ref 3.5–5.1)
Sodium: 131 mmol/L — ABNORMAL LOW (ref 135–145)

## 2021-12-21 LAB — CULTURE, BLOOD (SINGLE): Culture: NO GROWTH

## 2021-12-21 LAB — SODIUM: Sodium: 129 mmol/L — ABNORMAL LOW (ref 135–145)

## 2021-12-21 MED ORDER — POLYETHYLENE GLYCOL 3350 17 G PO PACK
17.0000 g | PACK | Freq: Two times a day (BID) | ORAL | Status: AC
  Administered 2021-12-21 (×2): 17 g via ORAL
  Filled 2021-12-21 (×2): qty 1

## 2021-12-21 MED ORDER — BISACODYL 10 MG RE SUPP
10.0000 mg | Freq: Once | RECTAL | Status: AC
  Administered 2021-12-21: 10 mg via RECTAL
  Filled 2021-12-21: qty 1

## 2021-12-21 MED ORDER — SODIUM CHLORIDE 1 G PO TABS
2.0000 g | ORAL_TABLET | Freq: Three times a day (TID) | ORAL | Status: AC
  Administered 2021-12-21: 2 g via ORAL
  Filled 2021-12-21: qty 2

## 2021-12-21 MED ORDER — SENNOSIDES-DOCUSATE SODIUM 8.6-50 MG PO TABS
2.0000 | ORAL_TABLET | Freq: Two times a day (BID) | ORAL | Status: AC
  Administered 2021-12-21 (×2): 2 via ORAL
  Filled 2021-12-21 (×2): qty 2

## 2021-12-21 MED ORDER — BISACODYL 10 MG RE SUPP: 10.0000 mg | Freq: Once | RECTAL | Status: DC

## 2021-12-21 NOTE — Progress Notes (Signed)
Physical Therapy Treatment Patient Details Name: Lisa Koch MRN: [id number] DOB: November 21, 1924 Today's Date: 12/21/2021   History of Present Illness Lisa Koch is a 88 y.o. female with medical history significant for HTN, seen in the ED on 10/01 with a fall with brief LOC, treated for UTI(culture with 10,000 urogenital flora) and discharged who was sent by her PCP for admission for hyponatremia.    PT Comments    Pt on commode ready to walk.  Stood and walks x 1 lap on unit with RW and min guard.  Slow, steady gait.  Pt stood at chairside for several minutes after gait.  Pt with back brace in room.  Refused to wear. Explained risks/benefits of brace but she did not want to wear it.  Daughter in and aware.   Encouraged no bending and twisting if she was not going to wear brace.  Discussed log rolling for in/out of bed.   Recommendations for follow up therapy are one component of a multi-disciplinary discharge planning process, led by the attending physician.  Recommendations may be updated based on patient status, additional functional criteria and insurance authorization.  Follow Up Recommendations  No PT follow up     Assistance Recommended at Discharge PRN  Patient can return home with the following A little help with walking and/or transfers;A little help with bathing/dressing/bathroom;Assist for transportation;Help with stairs or ramp for entrance   Equipment Recommendations  None recommended by PT    Recommendations for Other Services       Precautions / Restrictions Precautions Precautions: Fall;Back Required Braces or Orthoses: Spinal Brace Spinal Brace: Thoracolumbosacral orthotic;Applied in sitting position Restrictions Weight Bearing Restrictions: No     Mobility  Bed Mobility               General bed mobility comments: on commode upon arrival and stays in chair after    Transfers Overall transfer level: Needs assistance Equipment used: Rolling  walker (2 wheels) Transfers: Sit to/from Stand Sit to Stand: Supervision                Ambulation/Gait Ambulation/Gait assistance: Min guard Gait Distance (Feet): 200 Feet Assistive device: Rolling walker (2 wheels) Gait Pattern/deviations: Step-through pattern Gait velocity: decreased     General Gait Details: pt wiht slowed gait pattern, but safe with ambulation.   Stairs             Wheelchair Mobility    Modified Rankin (Stroke Patients Only)       Balance Overall balance assessment: Needs assistance Sitting-balance support: No upper extremity supported, Feet supported Sitting balance-Leahy Scale: Good     Standing balance support: Single extremity supported, During functional activity Standing balance-Leahy Scale: Fair                              Cognition Arousal/Alertness: Awake/alert Behavior During Therapy: WFL for tasks assessed/performed Overall Cognitive Status: Within Functional Limits for tasks assessed                                          Exercises      General Comments        Pertinent Vitals/Pain Pain Assessment Pain Assessment: Faces Faces Pain Scale: Hurts a little bit Pain Location: low back Pain Descriptors / Indicators: Sore Pain Intervention(s): Premedicated before session, Repositioned, Limited  activity within patient's tolerance, Monitored during session    Home Living                          Prior Function            PT Goals (current goals can now be found in the care plan section) Progress towards PT goals: Progressing toward goals    Frequency           PT Plan Current plan remains appropriate    Co-evaluation              AM-PAC PT "6 Clicks" Mobility   Outcome Measure  Help needed turning from your back to your side while in a flat bed without using bedrails?: A Little Help needed moving from lying on your back to sitting on the side of a  flat bed without using bedrails?: A Little Help needed moving to and from a bed to a chair (including a wheelchair)?: A Little Help needed standing up from a chair using your arms (e.g., wheelchair or bedside chair)?: A Little Help needed to walk in hospital room?: A Little Help needed climbing 3-5 steps with a railing? : A Little 6 Click Score: 18    End of Session Equipment Utilized During Treatment: Gait belt Activity Tolerance: Patient tolerated treatment well Patient left: in chair;with call bell/phone within reach;with chair alarm set;with family/visitor present Nurse Communication: Mobility status PT Visit Diagnosis: Unsteadiness on feet (R26.81);Other abnormalities of gait and mobility (R26.89);Muscle weakness (generalized) (M62.81);History of falling (Z91.81);Difficulty in walking, not elsewhere classified (R26.2);Pain     Time: 0347-4259 PT Time Calculation (min) (ACUTE ONLY): 19 min  Charges:  $Gait Training: 8-22 mins                   Chesley Noon, PTA 12/21/21, 10:00 AM

## 2021-12-21 NOTE — Progress Notes (Signed)
PROGRESS NOTE    Lisa Koch   FXT:[id number] DOB: 11-01-1924  DOA: 12/16/2021 Date of Service: 12/21/21 PCP: Tracie Harrier, MD     Brief Narrative / Hospital Course:  Lisa Koch is a 88 y.o. female with medical history significant for HTN, seen in the ED on 10/01 with a fall with brief LOC, treated for UTI(culture with 10,000 urogenital flora) and discharged who was sent by her PCP for admission for hyponatremia.  Patient presented to her PCP for post ED follow-up and family was concerned about ongoing confusion, persistent nausea and persistent thoracic and lower back pain.  She had a CT scan on 10/one of her spine that showed chronic compression fractures.  They also noted that she had a slightly distended abdomen that she had not had a bowel movement in 3 days. 10/03: BP 171/67, pulse 60, respirations 21 with O2 sat 96% on room air and afebrile.  Labs significant for sodium 120, down from 132 on 10/01.  Bilirubin 3.1 up from 0.6 on 10/01.  WBC 13.5, about the same as 2 days ago in spite of treatment with Keflex for UTI.  Serum osmolality 252, urine osmolality 215. Trauma imaging including CT head, C-spine and abdomen and pelvis with mostly nonacute findings.  Notable findings included a T10 vertebral body compression fracture with 15% height loss, 1.4 cm gallstone impacted in the gallbladder neck without gallbladder wall thickening. Patient given sodium chloride bolus.  A TLSO brace was applied.  Hospitalist consulted for admission - admitted for acute metabolic encephalopathy secondary to hyponatremia, possibly postconcussive from fall, versus possible underlying infection.  Continued on home Keflex, urine recollected.  Continued on IV normal saline for hyponatremia.  Concern for cholelithiasis/elevated bilirubin, monitoring hepatic function and added on lipase.  Neurosurgery consulted: Maintain TLSO brace but okay to remove while in bed, PT/OT, arrange outpatient follow-up. 10/04:  bradycardic into 30s-40s per RN early AM, EKG no concerns, sinus. HR improved on rounds. Continue treat pain and hyponatremia.  Caused NS infusion due to concern for rapid correction, sodium was stable in the afternoon 128 10/05: Sodium on AM labs to 125, restarted NS --> 128 mid-day, recheck pending later this afternoon  10/06: Sodium to 131 then 134 today. Will trial reduce IV fluids and maintain on salt tabs.  10/07: Sodium stable at 134. D/c IV fluids. Remains stable in afternoon. Pt and family requesting DME for bedside commode, walker. Pt very anxious hasn't had BM in 2 days and feels constipated. Bowel regimen administered. If normal or stable sodium tomorrow will plan for discharge.  10/08: sodium dropping thru today. Minimal po intake d/t constipation, but also has been off NS. Have asked nephrology to give second opinion.   Consultants:  Neurosurgery - no procedures planned Nephrology   Procedures: none      ASSESSMENT & PLAN:   Principal Problem:   Acute metabolic encephalopathy Active Problems:   Acute hyponatremia   Cholelithiasis   HTN (hypertension)   Recent fall with LOC 12/14/21   Elevated bilirubin   Back pain secondary to closed wedge compression fracture of tenth thoracic vertebra (HCC)  Acute hyponatremia Hypotonic and suspect hypovolemic, also thiazide effect  IV NS bolus and continuous fluids after that have been done  D/c home HCTZ Serum osmolality 252, urine osmolality 215 and urine sodium 12 IV NS --> discontinued afternoon 12/17/2021 due to sodium going from 120-128 in less than 24 hours, recehck in PM --> stable at 128 --> AM 10/05 down to  125, resume NS --> 128 mid-day --> 131 AM today 10/06 and 134 later in afternoon --> Will trial reduce IV fluids rate, continue fluid restriction. --> D/c fluids and monitor if stable sodium --> sodium dropping again Consulted nephro   Acute metabolic encephalopathy Secondary to acute hyponatremia, possible  postconcussive from fall, unlikely underlying infection Mental status appears improved 12/17/21 alert/oriented  Neurologic checks, fall precautions Urine recollected, WNL no UTI  Cholelithiasis Elevated bilirubin Bilirubin elevated at 1.4 above 0.6 a couple days prior patient symptomatic for nausea.  WBC elevated.  LFTs normal CT shows 1.4 cm gallstone appears impacted in the gallbladder neck but there is no gallbladder wall thickening. Continue to monitor hepatic function --> stable and WNL will add on lipase --> WNL  HTN (hypertension) Elevated BP with systolic in the 657Q to 469G Continue lisinopril and verapamil DC HCTZ given hyponatremia  Back pain secondary to closed wedge compression fracture of tenth thoracic vertebra (HCC) Continue TLSO brace while OOB Pain control PT/OT - no follow-up needed Outpatient follow-up with neurosurgery  Recent fall with LOC 12/14/21 Etiology of fall likely d/t hyponatremia/weakness.   Patient has improving occipitotemporal scalp hematoma without intracranial injury Fall precautions PT/OT eval - no follow-up needed  Neurologic checks can d/c  Nasal Congestion Family requesting something to help patient cough up "frog in her throat" DuoNeb x1 Saline nasal spry     DVT prophylaxis: lovenox Pertinent IV fluids/nutrition: NS treating hyponatremia Central lines / invasive devices: none  Code Status: DNR Family Communication: daughter in law at bedside on rounds   Disposition: inpatient TOC needs: DME Barriers to discharge / significant pending items: DME, now w/ worsening hyponatremia again, pending nephro second opinion             Subjective:  Patient reports pain is controlled, still very concerned about constipation. No abdominal pain or nausea.       Objective:  Vitals:   12/20/21 0853 12/20/21 1538 12/20/21 2025 12/21/21 1050  BP: (!) 186/79 139/64 (!) 160/66 (!) 123/53  Pulse: 65 70 66 74  Resp: '17 17 16  17  '$ Temp: 98.4 F (36.9 C) 98.6 F (37 C) 98.6 F (37 C) 98.4 F (36.9 C)  TempSrc: Oral     SpO2: 98% 95% 97% 94%  Weight:      Height:       No intake or output data in the 24 hours ending 12/21/21 1525  Filed Weights   12/16/21 1944  Weight: 60 kg    Examination:  Constitutional:  VS as above General Appearance: alert, NAD. Very hard of hearing.  Ears, Nose, Mouth, Throat: Normal external appearance MMM Neck: No masses, trachea midline Respiratory: - exam limited d/t brace Normal respiratory effort No wheeze No rhonchi No rales Cardiovascular: S1/S2 normal RRR No murmur No rub/gallop auscultated Gastrointestinal: No tenderness Musculoskeletal:  No clubbing/cyanosis of digits Symmetrical movement in all extremities Neurological: No cranial nerve deficit on limited exam Alert, oriented Psychiatric: Normal judgment/insight Pleasant mood and affect       Scheduled Medications:   enoxaparin (LOVENOX) injection  40 mg Subcutaneous E95M   folic acid  2 mg Oral Daily   influenza vaccine adjuvanted  0.5 mL Intramuscular Tomorrow-1000   lisinopril  40 mg Oral Daily   [START ON 12/22/2021] methotrexate  10 mg Oral Weekly   pantoprazole  40 mg Oral Daily   polyethylene glycol  17 g Oral BID   potassium chloride  20 mEq Oral q1800  senna-docusate  2 tablet Oral BID   verapamil  240 mg Oral Daily    Continuous Infusions:     PRN Medications:  acetaminophen **OR** acetaminophen, HYDROcodone-acetaminophen, ondansetron **OR** ondansetron (ZOFRAN) IV, sodium chloride  Antimicrobials:  Anti-infectives (From admission, onward)    None       Data Reviewed: I have personally reviewed following labs and imaging studies  CBC: Recent Labs  Lab 12/16/21 1953 12/17/21 0400  WBC 13.5* 9.5  NEUTROABS 11.1*  --   HGB 12.2 11.7*  HCT 34.8* 33.1*  MCV 93.3 93.8  PLT 196 440   Basic Metabolic Panel: Recent Labs  Lab 12/17/21 0400 12/17/21 1049  12/17/21 1849 12/18/21 0523 12/18/21 1155 12/19/21 1521 12/20/21 0619 12/20/21 1323 12/21/21 0559 12/21/21 1222  NA 125* 128*   < > 125*   < > 134* 134* 134* 131* 129*  K 3.9 3.7  --  4.0  --   --  4.1  --  4.1  --   CL 93* 97*  --  94*  --   --  105  --  100  --   CO2 26 24  --  22  --   --  26  --  25  --   GLUCOSE 95 146*  --  136*  --   --  106*  --  101*  --   BUN 14 13  --  14  --   --  9  --  11  --   CREATININE 0.68 0.68  --  0.69  --   --  0.59  --  0.61  --   CALCIUM 9.3 9.0  --  9.1  --   --  8.4*  --  9.0  --    < > = values in this interval not displayed.   GFR: Estimated Creatinine Clearance: 34.1 mL/min (by C-G formula based on SCr of 0.61 mg/dL). Liver Function Tests: Recent Labs  Lab 12/16/21 1953 12/17/21 0400  AST 27 24  ALT 27 25  ALKPHOS 53 50  BILITOT 1.3* 1.3*  PROT 6.0* 5.5*  ALBUMIN 3.5 3.4*   Recent Labs  Lab 12/17/21 0400  LIPASE 34   No results for input(s): "AMMONIA" in the last 168 hours. Coagulation Profile: No results for input(s): "INR", "PROTIME" in the last 168 hours. Cardiac Enzymes: No results for input(s): "CKTOTAL", "CKMB", "CKMBINDEX", "TROPONINI" in the last 168 hours. BNP (last 3 results) No results for input(s): "PROBNP" in the last 8760 hours. HbA1C: No results for input(s): "HGBA1C" in the last 72 hours. CBG: No results for input(s): "GLUCAP" in the last 168 hours. Lipid Profile: No results for input(s): "CHOL", "HDL", "LDLCALC", "TRIG", "CHOLHDL", "LDLDIRECT" in the last 72 hours. Thyroid Function Tests: No results for input(s): "TSH", "T4TOTAL", "FREET4", "T3FREE", "THYROIDAB" in the last 72 hours. Anemia Panel: No results for input(s): "VITAMINB12", "FOLATE", "FERRITIN", "TIBC", "IRON", "RETICCTPCT" in the last 72 hours. Urine analysis:    Component Value Date/Time   COLORURINE STRAW (A) 12/16/2021 2349   APPEARANCEUR CLEAR (A) 12/16/2021 2349   LABSPEC 1.013 12/16/2021 2349   PHURINE 6.0 12/16/2021 2349    GLUCOSEU NEGATIVE 12/16/2021 2349   HGBUR NEGATIVE 12/16/2021 2349   BILIRUBINUR NEGATIVE 12/16/2021 2349   KETONESUR 20 (A) 12/16/2021 2349   PROTEINUR NEGATIVE 12/16/2021 2349   NITRITE NEGATIVE 12/16/2021 2349   LEUKOCYTESUR NEGATIVE 12/16/2021 2349   Sepsis Labs: '@LABRCNTIP'$ (procalcitonin:4,lacticidven:4)  Recent Results (from the past 240 hour(s))  Urine Culture  Status: Abnormal   Collection Time: 12/14/21  1:40 AM   Specimen: Urine, Clean Catch  Result Value Ref Range Status   Specimen Description   Final    URINE, CLEAN CATCH Performed at [facility], [address], [place], [place] [address]    Special Requests   Final    NONE Performed at [facility], [address] [place], [place] [address]    Culture (A)  Final    10,000 COLONIES/mL MULTIPLE SPECIES PRESENT, SUGGEST RECOLLECTION   Report Status 12/15/2021 FINAL  Final  Blood culture (single)     Status: None   Collection Time: 12/16/21  7:54 PM   Specimen: BLOOD  Result Value Ref Range Status   Specimen Description BLOOD LEFT FOREARM  Final   Special Requests   Final    BOTTLES DRAWN AEROBIC AND ANAEROBIC Blood Culture results may not be optimal due to an inadequate volume of blood received in culture bottles   Culture   Final    NO GROWTH 5 DAYS Performed at [facility], [address] [place], [place] [address]    Report Status 12/21/2021 FINAL  Final         Radiology Studies: CT HEAD WO CONTRAST (5MM)  Result Date: 12/16/2021 CLINICAL DATA:  [place] 3 days ago, with hyponatremia and confusion. Neck pain. EXAM: CT HEAD WITHOUT CONTRAST CT CERVICAL SPINE WITHOUT CONTRAST CT ABDOMEN AND PELVIS WITH CONTRAST TECHNIQUE: Contiguous axial images were obtained from the base of the skull through the vertex without intravenous contrast. Multidetector CT imaging of the cervical spine was performed without intravenous contrast. Multiplanar CT image reconstructions were also  generated. Multidetector CT imaging of the abdomen and pelvis was performed using the standard protocol following bolus administration of intravenous contrast. RADIATION DOSE REDUCTION: This exam was performed according to the departmental dose-optimization program which includes automated exposure control, adjustment of the mA and/or kV according to patient size and/or use of iterative reconstruction technique. CONTRAST:  8m OMNIPAQUE IOHEXOL 300 MG/ML  SOLN COMPARISON:  Head CT and cervical spine CT both 12/14/2021. No prior abdomen pelvis CT. FINDINGS: CT HEAD FINDINGS Brain: There is moderately advanced cerebral atrophy with atrophic ventriculomegaly and mild cerebellar atrophy. There is moderate to severe small vessel disease of the cerebral white matter. No asymmetry is seen concerning for an acute infarct, hemorrhage or mass. There is no midline shift. Basal cisterns are clear. Vascular: There are calcifications in the carotid siphons. No hyperdense central vessel is seen. Skull: Negative for fractures or focal lesions. 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 There is a small right occipitotemporal scalp hematoma which was larger previously. No new scalp hematoma is seen. Sinuses/Orbits: Old lens replacements. Visualized sinuses, bilateral mastoid air cells are clear. Other: None. CT CERVICAL SPINE FINDINGS Alignment: Stable findings. Reversed lordosis again noted centered at C5 and 2 mm grade 1 anterolisthesis C2-3, C3-4 and C4-5, trace retrolisthesis C5-6 and C6-7 all likely degenerative etiology. There is no widening of the anterior atlantodental joint. There is spurring along the C1-2 lateral mass articulations. Spurring of the anterior atlantodental joint. Skull base and vertebrae: Osteopenia. No fracture or pathologic bone lesion is seen. Soft tissues and spinal canal: No prevertebral fluid or swelling. No visible canal hematoma. A small left dorsal epidural calcified meningioma is again noted at C4-5, but  there is no associated mass effect. Disc levels: There is advanced degenerative disc collapse with bidirectional osteophytes at C5-6 and C6-7, posterior disc osteophyte complexes flattening of the ventral  cord surface at these levels. Other levels show lesser degenerative disc space loss minimal endplate spurring without mass effect. There is multilevel facet joint and uncinate hypertrophy with facet joint ankylosis on the left at C3-4. Degenerative foraminal stenosis is greatest on the left at C2-3 and C3-4, and on both sides at C4-5, and on the left again at C5-6 and C6-7. Upper chest: Negative. Other: None. CT ABDOMEN AND PELVIS FINDINGS Lower chest: There are atelectatic bands in the lower lobes and in the elevated right hemidiaphragm. There is an irregular 8 mm noncalcified nodule medially in the right middle lobe on series 4 axial image 2. Remaining lung bases are clear. There are scattered calcifications in the aortic valve leaflets and coronary arteries, mild cardiomegaly. Hepatobiliary: Breathing motion limits evaluation. There is no obvious mass. There is a 1.4 cm stone in the gallbladder neck, which may be impacted but there is no gallbladder thickening or biliary dilatation. Pancreas: No focal abnormality is seen through the breathing motion. Spleen: No focal abnormality is seen through the breathing motion. No splenomegaly. Adrenals/Urinary Tract: There is no adrenal mass. There is a wedge-shaped cortical scar laterally in the upper pole left kidney. There is a homogeneous 2.1 cm thin walled cyst in the upper pole of the right kidney or 14.1 Hounsfield units, 9 mm low-density lesion medial to this in the right upper pole which is too small to characterize. No imaging follow-up is recommended. Remainder of both kidneys are unremarkable. There is no urinary stone or obstruction. There is no bladder thickening. The ureters insert low in the pelvis indicating pelvic floor laxity but there is no cystocele.  Stomach/Bowel: The stomach is contracted. The small bowel is normal in caliber. An appendix is not seen in this patient. There is moderate stool retention particularly in the cecum and ascending colon, proximal transverse segment with left-sided diverticulosis without evidence of diverticulitis. Diverticulosis most advanced in the sigmoid segment. There is a small rectocele protruding just below the level of the pelvic floor. Vascular/Lymphatic: Aortic atherosclerosis. No enlarged abdominal or pelvic lymph nodes. Branch artery calcifications are noted, greatest in the renal artery ostia. Reproductive: Surgically absent uterus. The left ovary is unremarkable. The right ovary demonstrates a 5 cm thin walled homogeneous simple appearing cyst of 13.2 Hounsfield units. No adnexal solid mass is seen. Other: There is no free air, free hemorrhage or free fluid. There are small umbilical fat hernias. There is no incarcerated abdominal wall hernia. Multiple pelvic phleboliths. Musculoskeletal: There is osteopenia. There is a compression fracture of the T10 vertebral body, with transverse oblique linear fracture line through the inferior vertebral body, buckling along the anterior and posterior cortex, trace retropulsion of the posteroinferior cortex and approximately 15% loss of the overall vertebral body height. There is no extension into the pedicles and posterior elements. There is no associated herniated disc. There are degenerative changes of the lumbar spine greatest at L4-5 and L5-S1. Mild hip DJD and chondrocalcinosis. IMPRESSION: 1. No acute intracranial CT findings or depressed skull fractures. Calvarial osteopenia. 2. Small right occipitotemporal scalp hematoma, smaller than 2 days ago. 3. Osteopenia, degenerative changes, reversed lordosis in the cervical spine without evidence of fractures or traumatic listhesis. 4. Lower plate transverse oblique compression fracture of the T10 vertebral body with up to 15% loss  of the overall vertebral body height, with cortical buckling anteriorly and posteriorly and trace retropulsion. No spinal canal hematoma is seen. 5. 8 mm irregular nodule in the right middle lobe. The usual recommendation  per Fleischner guidelines would be for 6-12 month follow-up CT to ensure stability, with additional CT depending on whether or not the patient is high risk. In this case, follow-up imaging should be considered in light of the patient's advanced age and likelihood of intervention if the nodule shows growth. 6. 5 cm simple cyst right ovary. Three-month follow-up ultrasound for reassessment/recharacterization is suggested, taking into account advanced age. 7. No other acute CT findings. Chronic changes including constipation and diverticulosis. 8. Renal cysts and scarring with heavy calcification in the renal artery ostia. 9. 1.4 cm gallstone appears impacted in the gallbladder neck but there is no gallbladder wall thickening. 10. Aortic atherosclerosis. 11. Small rectocele protruding below the level of the pelvic floor. Additional evidence of pelvic floor laxity with low pelvic ureteral insertions but no cystocele. Electronically Signed   By: Telford Nab M.D.   On: 12/16/2021 21:36   CT Cervical Spine Wo Contrast  Result Date: 12/16/2021 CLINICAL DATA:  [place] 3 days ago, with hyponatremia and confusion. Neck pain. EXAM: CT HEAD WITHOUT CONTRAST CT CERVICAL SPINE WITHOUT CONTRAST CT ABDOMEN AND PELVIS WITH CONTRAST TECHNIQUE: Contiguous axial images were obtained from the base of the skull through the vertex without intravenous contrast. Multidetector CT imaging of the cervical spine was performed without intravenous contrast. Multiplanar CT image reconstructions were also generated. Multidetector CT imaging of the abdomen and pelvis was performed using the standard protocol following bolus administration of intravenous contrast. RADIATION DOSE REDUCTION: This exam was performed according to the  departmental dose-optimization program which includes automated exposure control, adjustment of the mA and/or kV according to patient size and/or use of iterative reconstruction technique. CONTRAST:  28m OMNIPAQUE IOHEXOL 300 MG/ML  SOLN COMPARISON:  Head CT and cervical spine CT both 12/14/2021. No prior abdomen pelvis CT. FINDINGS: CT HEAD FINDINGS Brain: There is moderately advanced cerebral atrophy with atrophic ventriculomegaly and mild cerebellar atrophy. There is moderate to severe small vessel disease of the cerebral white matter. No asymmetry is seen concerning for an acute infarct, hemorrhage or mass. There is no midline shift. Basal cisterns are clear. Vascular: There are calcifications in the carotid siphons. No hyperdense central vessel is seen. Skull: Negative for fractures or focal lesions. 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 There is a small right occipitotemporal scalp hematoma which was larger previously. No new scalp hematoma is seen. Sinuses/Orbits: Old lens replacements. Visualized sinuses, bilateral mastoid air cells are clear. Other: None. CT CERVICAL SPINE FINDINGS Alignment: Stable findings. Reversed lordosis again noted centered at C5 and 2 mm grade 1 anterolisthesis C2-3, C3-4 and C4-5, trace retrolisthesis C5-6 and C6-7 all likely degenerative etiology. There is no widening of the anterior atlantodental joint. There is spurring along the C1-2 lateral mass articulations. Spurring of the anterior atlantodental joint. Skull base and vertebrae: Osteopenia. No fracture or pathologic bone lesion is seen. Soft tissues and spinal canal: No prevertebral fluid or swelling. No visible canal hematoma. A small left dorsal epidural calcified meningioma is again noted at C4-5, but there is no associated mass effect. Disc levels: There is advanced degenerative disc collapse with bidirectional osteophytes at C5-6 and C6-7, posterior disc osteophyte complexes flattening of the ventral cord surface at these  levels. Other levels show lesser degenerative disc space loss minimal endplate spurring without mass effect. There is multilevel facet joint and uncinate hypertrophy with facet joint ankylosis on the left at C3-4. Degenerative foraminal stenosis is greatest on the left at C2-3 and C3-4, and on both sides at C4-5, and  on the left again at C5-6 and C6-7. Upper chest: Negative. Other: None. CT ABDOMEN AND PELVIS FINDINGS Lower chest: There are atelectatic bands in the lower lobes and in the elevated right hemidiaphragm. There is an irregular 8 mm noncalcified nodule medially in the right middle lobe on series 4 axial image 2. Remaining lung bases are clear. There are scattered calcifications in the aortic valve leaflets and coronary arteries, mild cardiomegaly. Hepatobiliary: Breathing motion limits evaluation. There is no obvious mass. There is a 1.4 cm stone in the gallbladder neck, which may be impacted but there is no gallbladder thickening or biliary dilatation. Pancreas: No focal abnormality is seen through the breathing motion. Spleen: No focal abnormality is seen through the breathing motion. No splenomegaly. Adrenals/Urinary Tract: There is no adrenal mass. There is a wedge-shaped cortical scar laterally in the upper pole left kidney. There is a homogeneous 2.1 cm thin walled cyst in the upper pole of the right kidney or 14.1 Hounsfield units, 9 mm low-density lesion medial to this in the right upper pole which is too small to characterize. No imaging follow-up is recommended. Remainder of both kidneys are unremarkable. There is no urinary stone or obstruction. There is no bladder thickening. The ureters insert low in the pelvis indicating pelvic floor laxity but there is no cystocele. Stomach/Bowel: The stomach is contracted. The small bowel is normal in caliber. An appendix is not seen in this patient. There is moderate stool retention particularly in the cecum and ascending colon, proximal transverse  segment with left-sided diverticulosis without evidence of diverticulitis. Diverticulosis most advanced in the sigmoid segment. There is a small rectocele protruding just below the level of the pelvic floor. Vascular/Lymphatic: Aortic atherosclerosis. No enlarged abdominal or pelvic lymph nodes. Branch artery calcifications are noted, greatest in the renal artery ostia. Reproductive: Surgically absent uterus. The left ovary is unremarkable. The right ovary demonstrates a 5 cm thin walled homogeneous simple appearing cyst of 13.2 Hounsfield units. No adnexal solid mass is seen. Other: There is no free air, free hemorrhage or free fluid. There are small umbilical fat hernias. There is no incarcerated abdominal wall hernia. Multiple pelvic phleboliths. Musculoskeletal: There is osteopenia. There is a compression fracture of the T10 vertebral body, with transverse oblique linear fracture line through the inferior vertebral body, buckling along the anterior and posterior cortex, trace retropulsion of the posteroinferior cortex and approximately 15% loss of the overall vertebral body height. There is no extension into the pedicles and posterior elements. There is no associated herniated disc. There are degenerative changes of the lumbar spine greatest at L4-5 and L5-S1. Mild hip DJD and chondrocalcinosis. IMPRESSION: 1. No acute intracranial CT findings or depressed skull fractures. Calvarial osteopenia. 2. Small right occipitotemporal scalp hematoma, smaller than 2 days ago. 3. Osteopenia, degenerative changes, reversed lordosis in the cervical spine without evidence of fractures or traumatic listhesis. 4. Lower plate transverse oblique compression fracture of the T10 vertebral body with up to 15% loss of the overall vertebral body height, with cortical buckling anteriorly and posteriorly and trace retropulsion. No spinal canal hematoma is seen. 5. 8 mm irregular nodule in the right middle lobe. The usual recommendation  per Fleischner guidelines would be for 6-12 month follow-up CT to ensure stability, with additional CT depending on whether or not the patient is high risk. In this case, follow-up imaging should be considered in light of the patient's advanced age and likelihood of intervention if the nodule shows growth. 6. 5 cm simple cyst  right ovary. Three-month follow-up ultrasound for reassessment/recharacterization is suggested, taking into account advanced age. 7. No other acute CT findings. Chronic changes including constipation and diverticulosis. 8. Renal cysts and scarring with heavy calcification in the renal artery ostia. 9. 1.4 cm gallstone appears impacted in the gallbladder neck but there is no gallbladder wall thickening. 10. Aortic atherosclerosis. 11. Small rectocele protruding below the level of the pelvic floor. Additional evidence of pelvic floor laxity with low pelvic ureteral insertions but no cystocele. Electronically Signed   By: Telford Nab M.D.   On: 12/16/2021 21:36   CT ABDOMEN PELVIS W CONTRAST  Result Date: 12/16/2021 CLINICAL DATA:  [place] 3 days ago, with hyponatremia and confusion. Neck pain. EXAM: CT HEAD WITHOUT CONTRAST CT CERVICAL SPINE WITHOUT CONTRAST CT ABDOMEN AND PELVIS WITH CONTRAST TECHNIQUE: Contiguous axial images were obtained from the base of the skull through the vertex without intravenous contrast. Multidetector CT imaging of the cervical spine was performed without intravenous contrast. Multiplanar CT image reconstructions were also generated. Multidetector CT imaging of the abdomen and pelvis was performed using the standard protocol following bolus administration of intravenous contrast. RADIATION DOSE REDUCTION: This exam was performed according to the departmental dose-optimization program which includes automated exposure control, adjustment of the mA and/or kV according to patient size and/or use of iterative reconstruction technique. CONTRAST:  75m OMNIPAQUE IOHEXOL  300 MG/ML  SOLN COMPARISON:  Head CT and cervical spine CT both 12/14/2021. No prior abdomen pelvis CT. FINDINGS: CT HEAD FINDINGS Brain: There is moderately advanced cerebral atrophy with atrophic ventriculomegaly and mild cerebellar atrophy. There is moderate to severe small vessel disease of the cerebral white matter. No asymmetry is seen concerning for an acute infarct, hemorrhage or mass. There is no midline shift. Basal cisterns are clear. Vascular: There are calcifications in the carotid siphons. No hyperdense central vessel is seen. Skull: Negative for fractures or focal lesions. 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 There is a small right occipitotemporal scalp hematoma which was larger previously. No new scalp hematoma is seen. Sinuses/Orbits: Old lens replacements. Visualized sinuses, bilateral mastoid air cells are clear. Other: None. CT CERVICAL SPINE FINDINGS Alignment: Stable findings. Reversed lordosis again noted centered at C5 and 2 mm grade 1 anterolisthesis C2-3, C3-4 and C4-5, trace retrolisthesis C5-6 and C6-7 all likely degenerative etiology. There is no widening of the anterior atlantodental joint. There is spurring along the C1-2 lateral mass articulations. Spurring of the anterior atlantodental joint. Skull base and vertebrae: Osteopenia. No fracture or pathologic bone lesion is seen. Soft tissues and spinal canal: No prevertebral fluid or swelling. No visible canal hematoma. A small left dorsal epidural calcified meningioma is again noted at C4-5, but there is no associated mass effect. Disc levels: There is advanced degenerative disc collapse with bidirectional osteophytes at C5-6 and C6-7, posterior disc osteophyte complexes flattening of the ventral cord surface at these levels. Other levels show lesser degenerative disc space loss minimal endplate spurring without mass effect. There is multilevel facet joint and uncinate hypertrophy with facet joint ankylosis on the left at C3-4. Degenerative  foraminal stenosis is greatest on the left at C2-3 and C3-4, and on both sides at C4-5, and on the left again at C5-6 and C6-7. Upper chest: Negative. Other: None. CT ABDOMEN AND PELVIS FINDINGS Lower chest: There are atelectatic bands in the lower lobes and in the elevated right hemidiaphragm. There is an irregular 8 mm noncalcified nodule medially in the right middle lobe on series 4 axial image 2. Remaining  lung bases are clear. There are scattered calcifications in the aortic valve leaflets and coronary arteries, mild cardiomegaly. Hepatobiliary: Breathing motion limits evaluation. There is no obvious mass. There is a 1.4 cm stone in the gallbladder neck, which may be impacted but there is no gallbladder thickening or biliary dilatation. Pancreas: No focal abnormality is seen through the breathing motion. Spleen: No focal abnormality is seen through the breathing motion. No splenomegaly. Adrenals/Urinary Tract: There is no adrenal mass. There is a wedge-shaped cortical scar laterally in the upper pole left kidney. There is a homogeneous 2.1 cm thin walled cyst in the upper pole of the right kidney or 14.1 Hounsfield units, 9 mm low-density lesion medial to this in the right upper pole which is too small to characterize. No imaging follow-up is recommended. Remainder of both kidneys are unremarkable. There is no urinary stone or obstruction. There is no bladder thickening. The ureters insert low in the pelvis indicating pelvic floor laxity but there is no cystocele. Stomach/Bowel: The stomach is contracted. The small bowel is normal in caliber. An appendix is not seen in this patient. There is moderate stool retention particularly in the cecum and ascending colon, proximal transverse segment with left-sided diverticulosis without evidence of diverticulitis. Diverticulosis most advanced in the sigmoid segment. There is a small rectocele protruding just below the level of the pelvic floor. Vascular/Lymphatic:  Aortic atherosclerosis. No enlarged abdominal or pelvic lymph nodes. Branch artery calcifications are noted, greatest in the renal artery ostia. Reproductive: Surgically absent uterus. The left ovary is unremarkable. The right ovary demonstrates a 5 cm thin walled homogeneous simple appearing cyst of 13.2 Hounsfield units. No adnexal solid mass is seen. Other: There is no free air, free hemorrhage or free fluid. There are small umbilical fat hernias. There is no incarcerated abdominal wall hernia. Multiple pelvic phleboliths. Musculoskeletal: There is osteopenia. There is a compression fracture of the T10 vertebral body, with transverse oblique linear fracture line through the inferior vertebral body, buckling along the anterior and posterior cortex, trace retropulsion of the posteroinferior cortex and approximately 15% loss of the overall vertebral body height. There is no extension into the pedicles and posterior elements. There is no associated herniated disc. There are degenerative changes of the lumbar spine greatest at L4-5 and L5-S1. Mild hip DJD and chondrocalcinosis. IMPRESSION: 1. No acute intracranial CT findings or depressed skull fractures. Calvarial osteopenia. 2. Small right occipitotemporal scalp hematoma, smaller than 2 days ago. 3. Osteopenia, degenerative changes, reversed lordosis in the cervical spine without evidence of fractures or traumatic listhesis. 4. Lower plate transverse oblique compression fracture of the T10 vertebral body with up to 15% loss of the overall vertebral body height, with cortical buckling anteriorly and posteriorly and trace retropulsion. No spinal canal hematoma is seen. 5. 8 mm irregular nodule in the right middle lobe. The usual recommendation per Fleischner guidelines would be for 6-12 month follow-up CT to ensure stability, with additional CT depending on whether or not the patient is high risk. In this case, follow-up imaging should be considered in light of the  patient's advanced age and likelihood of intervention if the nodule shows growth. 6. 5 cm simple cyst right ovary. Three-month follow-up ultrasound for reassessment/recharacterization is suggested, taking into account advanced age. 7. No other acute CT findings. Chronic changes including constipation and diverticulosis. 8. Renal cysts and scarring with heavy calcification in the renal artery ostia. 9. 1.4 cm gallstone appears impacted in the gallbladder neck but there is no gallbladder wall  thickening. 10. Aortic atherosclerosis. 11. Small rectocele protruding below the level of the pelvic floor. Additional evidence of pelvic floor laxity with low pelvic ureteral insertions but no cystocele. Electronically Signed   By: Telford Nab M.D.   On: 12/16/2021 21:36   CT L-SPINE NO CHARGE  Result Date: 12/16/2021 CLINICAL DATA:  Fall 3 days prior.  Hyponatremia. EXAM: CT LUMBAR SPINE WITHOUT CONTRAST TECHNIQUE: Multidetector CT imaging of the lumbar spine was performed without intravenous contrast administration. Multiplanar CT image reconstructions were also generated. RADIATION DOSE REDUCTION: This exam was performed according to the departmental dose-optimization program which includes automated exposure control, adjustment of the mA and/or kV according to patient size and/or use of iterative reconstruction technique. COMPARISON:  Lumbar spine radiographs 12/14/2021 FINDINGS: Segmentation: There are 5 non-rib-bearing lumbar-type vertebral bodies. Alignment: No sagittal spondylolisthesis. Minimal right lateral listhesis of L4 on L5. No significant scoliotic curvature. Vertebrae: No lumbar vertebral body height loss. Please see contemporaneous CT thoracic spine report for evaluation of acute to subacute T10 vertebral body compression fracture. Severe left-greater-than-right L4-5 and severe right and moderate left L5-S1 disc space narrowing, endplate sclerosis, and peripheral osteophytosis. Paraspinal and other soft  tissues: Moderate atherosclerotic calcifications within the abdominal aorta and iliac arteries. Tiny low density lesion within the partially visualized anterior right kidney measuring up to 11 x 9 mm, too small to further characterize but statistically most likely a cyst. No follow-up imaging is recommended. Minimally partially visualized distal colonic diverticulosis. Disc levels: L1-2: Mild-to-moderate bilateral facet joint hypertrophy. No central canal or neuroforaminal stenosis. L2-3: Mild-to-moderate bilateral facet joint hypertrophy. Mild posterior endplate spurring. Mild left-greater-than-right neuroforaminal stenosis. Moderate narrowing of the lateral recesses and mild central canal stenosis. L3-4: Moderate bilateral facet joint hypertrophy. Mild broad-based posterior disc osteophyte complex. Borderline mild right neuroforaminal stenosis. Moderate to severe central canal stenosis. L4-5: Moderate bilateral facet joint hypertrophy. Mild broad-based posterior disc osteophyte complex with moderate left intraforaminal endplate spurring. Moderate left and mild right neuroforaminal stenosis. Moderate narrowing of the lateral recesses and moderate central canal stenosis. L5-S1: Moderate bilateral facet joint hypertrophy. Minimal broad-based posterior disc osteophyte complex with moderate right intraforaminal endplate spurring. Mild-to-moderate right neuroforaminal stenosis. No central canal stenosis. IMPRESSION: 1. Please see contemporaneous CT of the thoracic spine for description of an acute to subacute inferior T10 vertebral body compression fracture. 2. No lumbar vertebral body acute fracture. 3. Multilevel degenerative disc and joint changes as above. Mild L2-3, moderate to severe L3-4, and moderate L4-5 central canal stenosis. 4. Multilevel neuroforaminal stenosis as above. The greatest levels include moderate left L4-5 and mild-to-moderate right L5-S1 neuroforaminal stenosis. Electronically Signed   By:  Yvonne Kendall M.D.   On: 12/16/2021 21:32   CT Thoracic Spine Wo Contrast  Result Date: 12/16/2021 CLINICAL DATA:  Back trauma.  Fall 3 days prior.  Hyponatremia. EXAM: CT THORACIC SPINE WITHOUT CONTRAST TECHNIQUE: Multidetector CT images of the thoracic were obtained using the standard protocol without intravenous contrast. RADIATION DOSE REDUCTION: This exam was performed according to the departmental dose-optimization program which includes automated exposure control, adjustment of the mA and/or kV according to patient size and/or use of iterative reconstruction technique. COMPARISON:  Thoracic spine radiographs 12/14/2021 FINDINGS: Alignment: No sagittal spondylolisthesis. Vertebrae: There is minimal approximate 5% height loss of the mid T10 vertebral body. This was seen on recent 12/14/2021 radiographs. There is an acute to subacute horizontal linear fracture line extending from the anterior mid to inferior T10 vertebral body cortex through the posteroinferior vertebral body cortex.  Mild 3 mm anterior and 3 mm posterior T10 vertebral body cortical buckling (sagittal series 6, image 23). There is approximate 30% height loss of the mid to anterior T4 vertebral body, similar to recent 12/15/2021 radiographs. There is chronic sclerosis within the T4 superior endplate. No acute fracture line is seen. No significant retropulsion. Mild T3-4 through T6-7 disc space narrowing. Paraspinal and other soft tissues: Mild-to-moderate atherosclerotic calcifications within the partially visualized thoracic aorta. Mild curvilinear likely subsegmental atelectasis within the partially visualized medial aspect of the lungs. Coronary artery calcifications are noted. Disc levels: T10-11: Mild retropulsion of the posteroinferior T10 vertebral body. Mild bilateral facet joint hypertrophy. Moderate right and borderline mild left neuroforaminal stenosis. No central canal stenosis. IMPRESSION: 1. Acute to subacute T10 vertebral body  compression fracture with minimal 5 mm height loss. There is mild posteroinferior T10 vertebral body retropulsion. Moderate right and borderline mild left T10-11 neuroforaminal stenosis. 2. Chronic appearing 30% height loss of the mid to anterior T4 vertebral body. Aortic Atherosclerosis (ICD10-I70.0). Electronically Signed   By: Yvonne Kendall M.D.   On: 12/16/2021 21:20            LOS: 5 days       Emeterio Reeve, DO [facility] 12/21/2021, 3:25 PM   Staff may message me via secure chat in [place]  but this may not receive immediate response,  please page for urgent matters!  If 7PM-7AM, please contact night-coverage [email or web address]  Dictation software was used to generate the above note. Typos may occur and escape review, as with typed/written notes. Please contact Dr Sheppard Coil directly for clarity if needed.

## 2021-12-22 ENCOUNTER — Inpatient Hospital Stay: Payer: Medicare HMO

## 2021-12-22 DIAGNOSIS — G9341 Metabolic encephalopathy: Secondary | ICD-10-CM | POA: Diagnosis not present

## 2021-12-22 LAB — BASIC METABOLIC PANEL
Anion gap: 6 (ref 5–15)
BUN: 11 mg/dL (ref 8–23)
CO2: 24 mmol/L (ref 22–32)
Calcium: 8.8 mg/dL — ABNORMAL LOW (ref 8.9–10.3)
Chloride: 102 mmol/L (ref 98–111)
Creatinine, Ser: 0.65 mg/dL (ref 0.44–1.00)
GFR, Estimated: >60 mL/min (ref >60)
Glucose, Bld: 112 mg/dL — ABNORMAL HIGH (ref 70–99)
Potassium: 4 mmol/L (ref 3.5–5.1)
Sodium: 132 mmol/L — ABNORMAL LOW (ref 135–145)

## 2021-12-22 LAB — SODIUM: Sodium: 130 mmol/L — ABNORMAL LOW (ref 135–145)

## 2021-12-22 MED ORDER — DICLOFENAC SODIUM 1 % EX GEL: 4.0000 g | Freq: Four times a day (QID) | CUTANEOUS | 0 refills | Status: AC | PRN

## 2021-12-22 MED ORDER — DICLOFENAC SODIUM 1 % EX GEL
4.0000 g | Freq: Four times a day (QID) | CUTANEOUS | Status: DC | PRN
  Filled 2021-12-22: qty 100

## 2021-12-22 MED ORDER — POLYETHYLENE GLYCOL 3350 17 G PO PACK: 17.0000 g | PACK | Freq: Two times a day (BID) | ORAL | Status: DC

## 2021-12-22 MED ORDER — LABETALOL HCL 5 MG/ML IV SOLN
5.0000 mg | Freq: Once | INTRAVENOUS | Status: AC
  Administered 2021-12-22: 5 mg via INTRAVENOUS
  Filled 2021-12-22: qty 4

## 2021-12-22 MED ORDER — SENNOSIDES-DOCUSATE SODIUM 8.6-50 MG PO TABS: 2.0000 | ORAL_TABLET | Freq: Two times a day (BID) | ORAL | Status: AC

## 2021-12-22 NOTE — Consult Note (Signed)
[facility]  CONSULT NOTE    Date: 12/22/2021                  Patient Name:  Lisa Koch  MRN: [id number]  DOB: 01-05-1925  Age / Sex: 88 y.o., female         PCP: Tracie Harrier, MD                 Service Requesting Consult: TRH                 Reason for Consult: Hyponatremia            History of Present Illness: [name] is a 88 y.o.  female with past medical history of hypertension,arthritis, mitral insufficiency, and anemia, who was admitted to [facility] on 12/16/2021 for Acute hyponatremia [E87.1] Closed wedge compression fracture of T10 vertebra, initial encounter ([place]) [G99.242A] Acute metabolic encephalopathy [S34.19]  Patient presents to the ED at the advice of her primary care physician due to abnormal labs.  It appears she was recently seen in the emergency department and treated for UTI.  Patient is seen sitting in chair, daughter at bedside.  Daughter states patient has had poor appetite for few days.  Has continued to drink fluids to maintain hydration.  Denies nausea or vomiting.  Patient has continued to take all medications, including hydrochlorothiazide.  Denies headache or dizziness.  Labs on ED arrival include sodium 120, elevated WBC 13.5.  Urinalysis appears normal.  Serum osmolality is 252 with urine osmolality 215.  Patient has received normal saline infusion with correction until stopped.   Medications: Outpatient medications: Medications Prior to Admission  Medication Sig Dispense Refill Last Dose   aspirin 81 MG EC tablet Take 81 mg by mouth daily.   12/16/2021 at 1000   cephALEXin (KEFLEX) 250 MG capsule Take 1 capsule (250 mg total) by mouth 3 (three) times daily. 21 capsule 0 12/16/2021 at 1115   [EXPIRED] ciprofloxacin (CIPRO) 250 MG tablet Take 250 mg by mouth 2 (two) times daily.   62/04/2977 at 8921   folic acid (FOLVITE) 1 MG tablet Take 2 mg by mouth daily.   12/16/2021   lisinopril (ZESTRIL) 40 MG tablet Take  40 mg by mouth daily.   12/16/2021   meloxicam (MOBIC) 7.5 MG tablet Take 7.5 mg by mouth daily as needed for pain.   prn at prn   methotrexate (RHEUMATREX) 2.5 MG tablet Take 10 mg by mouth once a week.   12/15/2021   omeprazole (PRILOSEC) 10 MG capsule Take 10 mg by mouth daily.   12/16/2021   ondansetron (ZOFRAN-ODT) 4 MG disintegrating tablet Take 4 mg by mouth every 8 (eight) hours as needed. Take 1 tablet (4 mg total) by mouth every 8 (eight) hours as needed for Nausea for up to 7 days   prn at prn   Potassium Chloride ER 20 MEQ TBCR Take 20 mEq by mouth daily.   12/15/2021 at 1900   verapamil (CALAN-SR) 240 MG CR tablet Take 240 mg by mouth daily.   12/16/2021   HYDROcodone-acetaminophen (NORCO/VICODIN) 5-325 MG tablet Take 1 tablet by mouth every 4 (four) hours as needed. (Patient not taking: Reported on 12/17/2021) 15 tablet 0 Not Taking    Current medications: Current Facility-Administered Medications  Medication Dose Route Frequency Provider Last Rate Last Admin   acetaminophen (TYLENOL) tablet 650 mg  650 mg Oral Q6H PRN Athena Masse, MD   650 mg at  12/22/21 1132   Or   acetaminophen (TYLENOL) suppository 650 mg  650 mg Rectal Q6H PRN Athena Masse, MD       diclofenac Sodium (VOLTAREN) 1 % topical gel 4 g  4 g Topical QID PRN Emeterio Reeve, DO       enoxaparin (LOVENOX) injection 40 mg  40 mg Subcutaneous Q24H Judd Gaudier V, MD   40 mg at 27/78/24 2353   folic acid (FOLVITE) tablet 2 mg  2 mg Oral Daily Emeterio Reeve, DO   2 mg at 12/22/21 0913   HYDROcodone-acetaminophen (NORCO/VICODIN) 5-325 MG per tablet 1 tablet  1 tablet Oral Q4H PRN Athena Masse, MD   1 tablet at 12/19/21 0443   influenza vaccine adjuvanted (FLUAD) injection 0.5 mL  0.5 mL Intramuscular Tomorrow-1000 Emeterio Reeve, DO       lisinopril (ZESTRIL) tablet 40 mg  40 mg Oral Daily Judd Gaudier V, MD   40 mg at 12/22/21 0913   methotrexate (RHEUMATREX) tablet 10 mg  10 mg Oral Weekly Judd Gaudier V, MD   10 mg at 12/22/21 1133   ondansetron (ZOFRAN) tablet 4 mg  4 mg Oral Q6H PRN Athena Masse, MD   4 mg at 12/18/21 1423   Or   ondansetron (ZOFRAN) injection 4 mg  4 mg Intravenous Q6H PRN Athena Masse, MD       pantoprazole (PROTONIX) EC tablet 40 mg  40 mg Oral Daily Judd Gaudier V, MD   40 mg at 12/22/21 0913   potassium chloride (KLOR-CON M) CR tablet 20 mEq  20 mEq Oral q1800 Athena Masse, MD   20 mEq at 12/21/21 1711   sodium chloride (OCEAN) 0.65 % nasal spray 1 spray  1 spray Each Nare PRN Emeterio Reeve, DO       verapamil (CALAN-SR) CR tablet 240 mg  240 mg Oral Daily Emeterio Reeve, DO   240 mg at 12/22/21 6144      Allergies: Allergies  Allergen Reactions   Nabumetone Swelling   Clindamycin Other (See Comments)    Other reaction(s): abdominal pain, Other (See Comments)      Past Medical History: History reviewed. No pertinent past medical history.   Past Surgical History: Past Surgical History:  Procedure Laterality Date   BREAST BIOPSY Left 2012   core - neg   BREAST BIOPSY Left 2013   excisional - neg   BREAST EXCISIONAL BIOPSY       Family History: Family History  Problem Relation Age of Onset   Breast cancer Neg Hx      Social History: Social History   Socioeconomic History   Marital status: Widowed    Spouse name: Not on file   Number of children: Not on file   Years of education: Not on file   Highest education level: Not on file  Occupational History   Not on file  Tobacco Use   Smoking status: Never   Smokeless tobacco: Never  Substance and Sexual Activity   Alcohol use: Never   Drug use: Never   Sexual activity: Not on file  Other Topics Concern   Not on file  Social History Narrative   Not on file   Social Determinants of Health   Financial Resource Strain: Not on file  Food Insecurity: No Food Insecurity (12/17/2021)   Hunger Vital Sign    Worried About Running Out of Food in the Last Year: Never  true    Ran Out  of Food in the Last Year: Never true  Transportation Needs: No Transportation Needs (12/17/2021)   PRAPARE - Hydrologist (Medical): No    Lack of Transportation (Non-Medical): No  Physical Activity: Not on file  Stress: Not on file  Social Connections: Not on file  Intimate Partner Violence: Not At Risk (12/17/2021)   Humiliation, Afraid, Rape, and Kick questionnaire    Fear of Current or Ex-Partner: No    Emotionally Abused: No    Physically Abused: No    Sexually Abused: No     Review of Systems: Review of Systems  Constitutional:  Negative for chills, fever and malaise/fatigue.  HENT:  Negative for congestion, sore throat and tinnitus.   Eyes:  Negative for blurred vision and redness.  Respiratory:  Negative for cough, shortness of breath and wheezing.   Cardiovascular:  Negative for chest pain, palpitations, claudication and leg swelling.  Gastrointestinal:  Negative for abdominal pain, blood in stool, diarrhea, nausea and vomiting.  Genitourinary:  Negative for flank pain, frequency and hematuria.  Musculoskeletal:  Negative for back pain, falls and myalgias.  Skin:  Negative for rash.  Neurological:  Negative for dizziness, weakness and headaches.  Endo/Heme/Allergies:  Does not bruise/bleed easily.  Psychiatric/Behavioral:  Negative for depression. The patient is not nervous/anxious and does not have insomnia.     Vital Signs: Blood pressure (!) 149/77, pulse 94, temperature 98.1 F (36.7 C), resp. rate 18, height 5' (1.524 m), weight 60 kg, SpO2 97 %.  Weight trends: Filed Weights   12/16/21 1944  Weight: 60 kg    Physical Exam: General: NAD  Head: Normocephalic, atraumatic. Moist oral mucosal membranes, hard of hearing  Eyes: Anicteric  Lungs:  Clear to auscultation, normal effort  Heart: Regular rate and rhythm  Abdomen:  Soft, nontender  Extremities:  No peripheral edema.  Neurologic: Nonfocal, moving all four  extremities  Skin: No lesions  Access: None     Lab results: Basic Metabolic Panel: Recent Labs  Lab 12/20/21 0619 12/20/21 1323 12/21/21 0559 12/21/21 1222 12/22/21 0735  NA 134*   < > 131* 129* 132*  K 4.1  --  4.1  --  4.0  CL 105  --  100  --  102  CO2 26  --  25  --  24  GLUCOSE 106*  --  101*  --  112*  BUN 9  --  11  --  11  CREATININE 0.59  --  0.61  --  0.65  CALCIUM 8.4*  --  9.0  --  8.8*   < > = values in this interval not displayed.    Liver Function Tests: Recent Labs  Lab 12/16/21 1953 12/17/21 0400  AST 27 24  ALT 27 25  ALKPHOS 53 50  BILITOT 1.3* 1.3*  PROT 6.0* 5.5*  ALBUMIN 3.5 3.4*   Recent Labs  Lab 12/17/21 0400  LIPASE 34   No results for input(s): "AMMONIA" in the last 168 hours.  CBC: Recent Labs  Lab 12/16/21 1953 12/17/21 0400  WBC 13.5* 9.5  NEUTROABS 11.1*  --   HGB 12.2 11.7*  HCT 34.8* 33.1*  MCV 93.3 93.8  PLT 196 168    Cardiac Enzymes: No results for input(s): "CKTOTAL", "CKMB", "CKMBINDEX", "TROPONINI" in the last 168 hours.  BNP: Invalid input(s): "POCBNP"  CBG: No results for input(s): "GLUCAP" in the last 168 hours.  Microbiology: Results for orders placed or performed during the hospital encounter  of 12/16/21  Blood culture (single)     Status: None   Collection Time: 12/16/21  7:54 PM   Specimen: BLOOD  Result Value Ref Range Status   Specimen Description BLOOD LEFT FOREARM  Final   Special Requests   Final    BOTTLES DRAWN AEROBIC AND ANAEROBIC Blood Culture results may not be optimal due to an inadequate volume of blood received in culture bottles   Culture   Final    NO GROWTH 5 DAYS Performed at [facility], [address] [place], [place] [address]    Report Status 12/21/2021 FINAL  Final    Coagulation Studies: No results for input(s): "LABPROT", "INR" in the last 72 hours.  Urinalysis: No results for input(s): "COLORURINE", "LABSPEC", "PHURINE", "GLUCOSEU", "HGBUR",  "BILIRUBINUR", "KETONESUR", "PROTEINUR", "UROBILINOGEN", "NITRITE", "LEUKOCYTESUR" in the last 72 hours.  Invalid input(s): "APPERANCEUR"    Imaging: No results found.   Assessment & Plan: Ms. BILLY ROCCO is a 88 y.o.  female with past medical history of hypertension,arthritis, mitral insufficiency, and anemia, who was admitted to [facility] on 12/16/2021 for Acute hyponatremia [E87.1] Closed wedge compression fracture of T10 vertebra, initial encounter ([place]) [M46.803O] Acute metabolic encephalopathy [Z22.48]  Hyponatremia likely due to hypovolemia.  Family reports poor oral intake.  Sodium on ED arrival 120.  Sodium response to normal saline IV fluid however decreases once fluids stopped.  Patient encouraged to increase and maintain oral intake and sodium correction expected.  Current sodium 132, patient cleared to discharge per renal stance.  We will hold salt tabs at this time due to low tolerance in geriatric population. With close follow-up with primary care physician.     LOS: 6   10/9/20232:07 PM

## 2021-12-22 NOTE — Plan of Care (Signed)

## 2021-12-22 NOTE — Progress Notes (Addendum)
Provider contacted overnight re: high BP without relief, PRN given. Hyperactive bowel sounds, flatus (+), BM (+)  x2 overnight medium mixed type 2-6 consistencies. Adequate UOP, VSS on RA. Pt extremely hard of hearing, hearing aids present. X1 assist with walker to restroom.

## 2021-12-22 NOTE — Progress Notes (Signed)
Rolling walker requested via [employer].

## 2021-12-22 NOTE — Progress Notes (Signed)
       CROSS COVER NOTE  NAME: Lisa Koch MRN: [id number] DOB : [birth year]    Date of Service   12/22/2021   HPI/Events of Note   Notified of elevated BP --> 181/81  Interventions   Assessment/Plan:  Hypertension - Goal BP <150/90 Labetalol IV 5 mg Continue home meds, titrate as needed      This document was prepared using Dragon voice recognition software and may include unintentional dictation errors.  Neomia Glass DNP, MBA, FNP-BC Nurse Practitioner [facility] Pager [phone]

## 2021-12-22 NOTE — Discharge Summary (Signed)
Physician Discharge Summary   Patient: Lisa Koch MRN: [id number]  DOB: 05-03-1924   Admit:     Date of Admission: 12/16/2021 Admitted from: home   Discharge: Date of discharge: 12/22/21 Disposition: Home Condition at discharge: good  CODE STATUS: DNR     Discharge Physician: Emeterio Reeve, DO [facility]     PCP: Tracie Harrier, MD  Recommendations for Outpatient Follow-up:  Follow up with PCP Tracie Harrier, MD in 1 weeks Please obtain labs/tests: sodium, urinalysis for UTI and consider urine sodium/osm and serum osm in 1 week or less  Please follow up on the following pending results: none PCP AND OTHER OUTPATIENT PROVIDERS: SEE BELOW FOR SPECIFIC DISCHARGE INSTRUCTIONS PRINTED FOR PATIENT IN ADDITION TO GENERIC AVS PATIENT INFO    Discharge Instructions     Diet - low sodium heart healthy   Complete by: As directed    Discharge instructions   Complete by: As directed    See medication list - no major changes. Constipation medications per list for the next week, then can use the outline below.  Call PCP to recheck labs within one week.  Eat a balanced diet and get as much activity as you can. Sitting around will worsen constipation. Not eating enough will cause sodium problems.   CONSTIPATION:  ALL THE TIME:  Lifestyle measures: -Hydration: drink plenty of water -Physical activity: at least walking daily -High-fiber foods Bulk forming laxatives  -Psyllium (Konsyl; Metamucil; Perdiem) -Methylcellulose (Citrucel) -Calcium polycarbophil (FiberCon; Fiber-Lax; Mitrolan) -Wheat dextrin (Benefiber)  AS NEEDED FOR 3-5 DAYS AT A TIME OR ALL THE TIME 1-2 TIMES PER WEEK  (CAN TAKE ONE FROM EACH CATEGORY E.G. MIRALAX + SENNA) Hyperosmolar or saline laxatives: -Polyethylene glycol (MiraLAX, GlycoLax) -Magnesium hydroxide (Milk of Magnesia)  -Magnesium citrate (Evac-Q-Mag)  Stimulant laxatives -Senna (eg, Black Draught, Ex-Lax, Fletcher's,  Castoria, Senokot)  -Bisacodyl (eg, Correctol, Doxidan, Dulcolax). -Note: Taking stimulant laxatives regularly or in large amounts can cause side effects, including low potassium levels. Thus, you should take these drugs carefully if you must use them regularly.  PRESCRIPTIONS that treat severe constipation. They are expensive, but may be recommended if you do not respond to other treatments. -Lubiprostone (Amitiza) -Linaclotide (Linzess)   Increase activity slowly   Complete by: As directed          Discharge Diagnoses: Principal Problem:   Acute metabolic encephalopathy Active Problems:   Acute hyponatremia   Cholelithiasis   HTN (hypertension)   Recent fall with LOC 12/14/21   Elevated bilirubin   Back pain secondary to closed wedge compression fracture of tenth thoracic vertebra [facility])       Hospital Course: Lisa Koch is a 88 y.o. female with medical history significant for HTN, seen in the ED on 10/01 with a fall with brief LOC, treated for UTI(culture with 10,000 urogenital flora) and discharged who was sent by her PCP for admission for hyponatremia.  Patient presented to her PCP for post ED follow-up and family was concerned about ongoing confusion, persistent nausea and persistent thoracic and lower back pain.  She had a CT scan on 10/one of her spine that showed chronic compression fractures.  They also noted that she had a slightly distended abdomen that she had not had a bowel movement in 3 days. 10/03: BP 171/67, pulse 60, respirations 21 with O2 sat 96% on room air and afebrile.  Labs significant for sodium 120, down from 132 on 10/01.  Bilirubin 3.1 up from 0.6  on 10/01.  WBC 13.5, about the same as 2 days ago in spite of treatment with Keflex for UTI.  Serum osmolality 252, urine osmolality 215. Trauma imaging including CT head, C-spine and abdomen and pelvis with mostly nonacute findings.  Notable findings included a T10 vertebral body compression fracture with  15% height loss, 1.4 cm gallstone impacted in the gallbladder neck without gallbladder wall thickening. Patient given sodium chloride bolus.  A TLSO brace was applied.  Hospitalist consulted for admission - admitted for acute metabolic encephalopathy secondary to hyponatremia, possibly postconcussive from fall, versus possible underlying infection.  Continued on home Keflex, urine recollected.  Continued on IV normal saline for hyponatremia.  Concern for cholelithiasis/elevated bilirubin, monitoring hepatic function and added on lipase.  Neurosurgery consulted: Maintain TLSO brace but okay to remove while in bed, PT/OT, arrange outpatient follow-up. 10/04: bradycardic into 30s-40s per RN early AM, EKG no concerns, sinus. HR improved on rounds. Continue treat pain and hyponatremia.  Caused NS infusion due to concern for rapid correction, sodium was stable in the afternoon 128 10/05: Sodium on AM labs to 125, restarted NS --> 128 mid-day, recheck pending later this afternoon  10/06: Sodium to 131 then 134 today. Will trial reduce IV fluids and maintain on salt tabs.  10/07: Sodium stable at 134. D/c IV fluids. Remains stable in afternoon. Pt and family requesting DME for bedside commode, walker. Pt very anxious hasn't had BM in 2 days and feels constipated. Bowel regimen administered. If normal or stable sodium tomorrow will plan for discharge.  10/08: sodium dropping thru today. Minimal po intake d/t constipation, but also has been off NS. Have asked nephrology to give second opinion.  10/09: sodium stable, no concerns from nephrology, KUB no obstruction. Pt cleared for discharge.   Consultants:  Neurosurgery - no procedures planned Nephrology   Procedures: none      ASSESSMENT & PLAN:   Principal Problem:   Acute metabolic encephalopathy Active Problems:   Acute hyponatremia   Cholelithiasis   HTN (hypertension)   Recent fall with LOC 12/14/21   Elevated bilirubin   Back pain secondary to  closed wedge compression fracture of tenth thoracic vertebra (HCC)  Acute hyponatremia - RESOLVED Hypotonic and suspect hypovolemic, also thiazide effect, poor por intake Follow outpatient  Acute metabolic encephalopathy - RESOLVED Secondary to acute hyponatremia, possible postconcussive from fall, unlikely underlying infection Mental status appears improved 12/17/21 alert/oriented   Cholelithiasis Elevated bilirubin Bilirubin elevated at 1.4 above 0.6 a couple days prior patient symptomatic for nausea.  WBC elevated.  LFTs normal CT shows 1.4 cm gallstone appears impacted in the gallbladder neck but there is no gallbladder wall thickening. Continue to monitor hepatic function --> stable and WNL will add on lipase --> WNL  HTN (hypertension) Elevated BP with systolic in the 765Y to 650P Continue lisinopril and verapamil DC HCTZ given hyponatremia  Back pain secondary to closed wedge compression fracture of tenth thoracic vertebra (HCC) Continue TLSO brace while OOB Pain control PT/OT - no follow-up needed Outpatient follow-up with neurosurgery  Recent fall with LOC 12/14/21 Etiology of fall likely d/t hyponatremia/weakness.   Patient has improving occipitotemporal scalp hematoma without intracranial injury Fall precautions PT/OT eval - no follow-up needed  Neurologic checks can d/c  Nasal Congestion Family requesting something to help patient cough up "frog in her throat" DuoNeb x1 Saline nasal spry            Discharge Instructions  Allergies as of 12/22/2021  Reactions   Nabumetone Swelling   Clindamycin Other (See Comments)   Other reaction(s): abdominal pain, Other (See Comments)        Medication List     STOP taking these medications    cephALEXin 250 MG capsule Commonly known as: KEFLEX   ciprofloxacin 250 MG tablet Commonly known as: CIPRO   HYDROcodone-acetaminophen 5-325 MG tablet Commonly known as: NORCO/VICODIN       TAKE  these medications    aspirin EC 81 MG tablet Take 81 mg by mouth daily.   diclofenac Sodium 1 % Gel Commonly known as: VOLTAREN Apply 4 g topically 4 (four) times daily as needed (MUSCLE/JOINT PAIN).   folic acid 1 MG tablet Commonly known as: FOLVITE Take 2 mg by mouth daily.   lisinopril 40 MG tablet Commonly known as: ZESTRIL Take 40 mg by mouth daily.   meloxicam 7.5 MG tablet Commonly known as: MOBIC Take 7.5 mg by mouth daily as needed for pain.   methotrexate 2.5 MG tablet Commonly known as: RHEUMATREX Take 10 mg by mouth once a week.   omeprazole 10 MG capsule Commonly known as: PRILOSEC Take 10 mg by mouth daily.   ondansetron 4 MG disintegrating tablet Commonly known as: ZOFRAN-ODT Take 4 mg by mouth every 8 (eight) hours as needed. Take 1 tablet (4 mg total) by mouth every 8 (eight) hours as needed for Nausea for up to 7 days   polyethylene glycol 17 g packet Commonly known as: MIRALAX / GLYCOLAX Take 17 g by mouth 2 (two) times daily. Until stooling regularly   Potassium Chloride ER 20 MEQ Tbcr Take 20 mEq by mouth daily.   senna-docusate 8.6-50 MG tablet Commonly known as: Senokot-S Take 2 tablets by mouth 2 (two) times daily. Until stooling regularly   verapamil 240 MG CR tablet Commonly known as: CALAN-SR Take 240 mg by mouth daily.               Durable Medical Equipment  (From admission, onward)           Start     Ordered   12/20/21 1710  For home use only DME Walker rolling  Once       Question Answer Comment  Walker: With 5 Inch Wheels   Patient needs a walker to treat with the following condition Ambulatory dysfunction   Patient needs a walker to treat with the following condition Compression fracture of body of thoracic vertebra ([place])      12/20/21 1710   12/20/21 1150  For home use only DME Bedside commode  Once       Question Answer Comment  Patient needs a bedside commode to treat with the following condition Ambulatory  dysfunction   Patient needs a bedside commode to treat with the following condition Urinary incontinence      12/20/21 1149             Follow-up Information     Tracie Harrier, MD. Schedule an appointment as soon as possible for a visit.   Specialty: Internal Medicine Why: for hospital follow up to recheck blood work later this week or early next week Contact information: [place] [address] [phone]                 Allergies  Allergen Reactions   Nabumetone Swelling   Clindamycin Other (See Comments)    Other reaction(s): abdominal pain, Other (See Comments)     Subjective:  pt reports feeling bloated but had stool last night    Discharge Exam: BP (!) 141/70 (BP Location: Left Arm)   Pulse 69   Temp 99.2 F (37.3 C) (Oral)   Resp 19   Ht 5' (1.524 m)   Wt 60 kg   SpO2 97%   BMI 25.83 kg/m  General: Pt is alert, awake, not in acute distress Cardiovascular: RRR, S1/S2 +, no rubs, no gallops Respiratory: CTA bilaterally, no wheezing, no rhonchi Abdominal: Soft, NT, mild distended, bowel sounds + Extremities: no edema, no cyanosis     The results of significant diagnostics from this hospitalization (including imaging, microbiology, ancillary and laboratory) are listed below for reference.     Microbiology: Recent Results (from the past 240 hour(s))  Urine Culture     Status: Abnormal   Collection Time: 12/14/21  1:40 AM   Specimen: Urine, Clean Catch  Result Value Ref Range Status   Specimen Description   Final    URINE, CLEAN CATCH Performed at [facility], [address] [place], [place] [address]    Special Requests   Final    NONE Performed at [facility], [address] [place], [place] [address]    Culture (A)  Final    10,000 COLONIES/mL MULTIPLE SPECIES PRESENT, SUGGEST RECOLLECTION   Report Status 12/15/2021 FINAL  Final  Blood culture (single)      Status: None   Collection Time: 12/16/21  7:54 PM   Specimen: BLOOD  Result Value Ref Range Status   Specimen Description BLOOD LEFT FOREARM  Final   Special Requests   Final    BOTTLES DRAWN AEROBIC AND ANAEROBIC Blood Culture results may not be optimal due to an inadequate volume of blood received in culture bottles   Culture   Final    NO GROWTH 5 DAYS Performed at [facility], [address] [place], [place] [address]    Report Status 12/21/2021 FINAL  Final     Labs: BNP (last 3 results) No results for input(s): "BNP" in the last 8760 hours. Basic Metabolic Panel: Recent Labs  Lab 12/17/21 1049 12/17/21 1849 12/18/21 0523 12/18/21 1155 12/20/21 0619 12/20/21 1323 12/21/21 0559 12/21/21 1222 12/22/21 0735 12/22/21 1455  NA 128*   < > 125*   < > 134* 134* 131* 129* 132* 130*  K 3.7  --  4.0  --  4.1  --  4.1  --  4.0  --   CL 97*  --  94*  --  105  --  100  --  102  --   CO2 24  --  22  --  26  --  25  --  24  --   GLUCOSE 146*  --  136*  --  106*  --  101*  --  112*  --   BUN 13  --  14  --  9  --  11  --  11  --   CREATININE 0.68  --  0.69  --  0.59  --  0.61  --  0.65  --   CALCIUM 9.0  --  9.1  --  8.4*  --  9.0  --  8.8*  --    < > = values in this interval not displayed.   Liver Function Tests: Recent Labs  Lab 12/16/21 1953 12/17/21 0400  AST 27 24  ALT 27 25  ALKPHOS 53 50  BILITOT 1.3* 1.3*  PROT 6.0* 5.5*  ALBUMIN 3.5 3.4*   Recent Labs  Lab 12/17/21 0400  LIPASE 34   No results for input(s): "AMMONIA" in the last 168 hours. CBC: Recent Labs  Lab 12/16/21 1953 12/17/21 0400  WBC 13.5* 9.5  NEUTROABS 11.1*  --   HGB 12.2 11.7*  HCT 34.8* 33.1*  MCV 93.3 93.8  PLT 196 168   Cardiac Enzymes: No results for input(s): "CKTOTAL", "CKMB", "CKMBINDEX", "TROPONINI" in the last 168 hours. BNP: Invalid input(s): "POCBNP" CBG: No results for input(s): "GLUCAP" in the last 168 hours. D-Dimer No results for input(s): "DDIMER" in  the last 72 hours. Hgb A1c No results for input(s): "HGBA1C" in the last 72 hours. Lipid Profile No results for input(s): "CHOL", "HDL", "LDLCALC", "TRIG", "CHOLHDL", "LDLDIRECT" in the last 72 hours. Thyroid function studies No results for input(s): "TSH", "T4TOTAL", "T3FREE", "THYROIDAB" in the last 72 hours.  Invalid input(s): "FREET3" Anemia work up No results for input(s): "VITAMINB12", "FOLATE", "FERRITIN", "TIBC", "IRON", "RETICCTPCT" in the last 72 hours. Urinalysis    Component Value Date/Time   COLORURINE STRAW (A) 12/16/2021 2349   APPEARANCEUR CLEAR (A) 12/16/2021 2349   LABSPEC 1.013 12/16/2021 2349   PHURINE 6.0 12/16/2021 2349   GLUCOSEU NEGATIVE 12/16/2021 2349   HGBUR NEGATIVE 12/16/2021 2349   BILIRUBINUR NEGATIVE 12/16/2021 2349   KETONESUR 20 (A) 12/16/2021 2349   PROTEINUR NEGATIVE 12/16/2021 2349   NITRITE NEGATIVE 12/16/2021 2349   LEUKOCYTESUR NEGATIVE 12/16/2021 2349   Sepsis Labs Recent Labs  Lab 12/16/21 1953 12/17/21 0400  WBC 13.5* 9.5   Microbiology Recent Results (from the past 240 hour(s))  Urine Culture     Status: Abnormal   Collection Time: 12/14/21  1:40 AM   Specimen: Urine, Clean Catch  Result Value Ref Range Status   Specimen Description   Final    URINE, CLEAN CATCH Performed at [facility], [address], [place], [place] [address]    Special Requests   Final    NONE Performed at [facility], [address], [place], [place] [address]    Culture (A)  Final    10,000 COLONIES/mL MULTIPLE SPECIES PRESENT, SUGGEST RECOLLECTION   Report Status 12/15/2021 FINAL  Final  Blood culture (single)     Status: None   Collection Time: 12/16/21  7:54 PM   Specimen: BLOOD  Result Value Ref Range Status   Specimen Description BLOOD LEFT FOREARM  Final   Special Requests   Final    BOTTLES DRAWN AEROBIC AND ANAEROBIC Blood Culture results may not be optimal due to an inadequate volume of blood received in culture  bottles   Culture   Final    NO GROWTH 5 DAYS Performed at [facility], [address], [place], [place] [address]    Report Status 12/21/2021 FINAL  Final   Imaging CT HEAD WO CONTRAST (5MM)  Result Date: 12/16/2021 CLINICAL DATA:  [place] 3 days ago, with hyponatremia and confusion. Neck pain. EXAM: CT HEAD WITHOUT CONTRAST CT CERVICAL SPINE WITHOUT CONTRAST CT ABDOMEN AND PELVIS WITH CONTRAST TECHNIQUE: Contiguous axial images were obtained from the base of the skull through the vertex without intravenous contrast. Multidetector CT imaging of the cervical spine was performed without intravenous contrast. Multiplanar CT image reconstructions were also generated. Multidetector CT imaging of the abdomen and pelvis was performed using the standard protocol following bolus administration of intravenous contrast. RADIATION DOSE REDUCTION: This exam was performed according to the departmental dose-optimization program which includes automated exposure control, adjustment of  the mA and/or kV according to patient size and/or use of iterative reconstruction technique. CONTRAST:  26m OMNIPAQUE IOHEXOL 300 MG/ML  SOLN COMPARISON:  Head CT and cervical spine CT both 12/14/2021. No prior abdomen pelvis CT. FINDINGS: CT HEAD FINDINGS Brain: There is moderately advanced cerebral atrophy with atrophic ventriculomegaly and mild cerebellar atrophy. There is moderate to severe small vessel disease of the cerebral white matter. No asymmetry is seen concerning for an acute infarct, hemorrhage or mass. There is no midline shift. Basal cisterns are clear. Vascular: There are calcifications in the carotid siphons. No hyperdense central vessel is seen. Skull: Negative for fractures or focal lesions. 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 There is a small right occipitotemporal scalp hematoma which was larger previously. No new scalp hematoma is seen. Sinuses/Orbits: Old lens replacements. Visualized sinuses, bilateral mastoid  air cells are clear. Other: None. CT CERVICAL SPINE FINDINGS Alignment: Stable findings. Reversed lordosis again noted centered at C5 and 2 mm grade 1 anterolisthesis C2-3, C3-4 and C4-5, trace retrolisthesis C5-6 and C6-7 all likely degenerative etiology. There is no widening of the anterior atlantodental joint. There is spurring along the C1-2 lateral mass articulations. Spurring of the anterior atlantodental joint. Skull base and vertebrae: Osteopenia. No fracture or pathologic bone lesion is seen. Soft tissues and spinal canal: No prevertebral fluid or swelling. No visible canal hematoma. A small left dorsal epidural calcified meningioma is again noted at C4-5, but there is no associated mass effect. Disc levels: There is advanced degenerative disc collapse with bidirectional osteophytes at C5-6 and C6-7, posterior disc osteophyte complexes flattening of the ventral cord surface at these levels. Other levels show lesser degenerative disc space loss minimal endplate spurring without mass effect. There is multilevel facet joint and uncinate hypertrophy with facet joint ankylosis on the left at C3-4. Degenerative foraminal stenosis is greatest on the left at C2-3 and C3-4, and on both sides at C4-5, and on the left again at C5-6 and C6-7. Upper chest: Negative. Other: None. CT ABDOMEN AND PELVIS FINDINGS Lower chest: There are atelectatic bands in the lower lobes and in the elevated right hemidiaphragm. There is an irregular 8 mm noncalcified nodule medially in the right middle lobe on series 4 axial image 2. Remaining lung bases are clear. There are scattered calcifications in the aortic valve leaflets and coronary arteries, mild cardiomegaly. Hepatobiliary: Breathing motion limits evaluation. There is no obvious mass. There is a 1.4 cm stone in the gallbladder neck, which may be impacted but there is no gallbladder thickening or biliary dilatation. Pancreas: No focal abnormality is seen through the breathing  motion. Spleen: No focal abnormality is seen through the breathing motion. No splenomegaly. Adrenals/Urinary Tract: There is no adrenal mass. There is a wedge-shaped cortical scar laterally in the upper pole left kidney. There is a homogeneous 2.1 cm thin walled cyst in the upper pole of the right kidney or 14.1 Hounsfield units, 9 mm low-density lesion medial to this in the right upper pole which is too small to characterize. No imaging follow-up is recommended. Remainder of both kidneys are unremarkable. There is no urinary stone or obstruction. There is no bladder thickening. The ureters insert low in the pelvis indicating pelvic floor laxity but there is no cystocele. Stomach/Bowel: The stomach is contracted. The small bowel is normal in caliber. An appendix is not seen in this patient. There is moderate stool retention particularly in the cecum and ascending colon, proximal transverse segment with left-sided diverticulosis without evidence of diverticulitis. Diverticulosis most  advanced in the sigmoid segment. There is a small rectocele protruding just below the level of the pelvic floor. Vascular/Lymphatic: Aortic atherosclerosis. No enlarged abdominal or pelvic lymph nodes. Branch artery calcifications are noted, greatest in the renal artery ostia. Reproductive: Surgically absent uterus. The left ovary is unremarkable. The right ovary demonstrates a 5 cm thin walled homogeneous simple appearing cyst of 13.2 Hounsfield units. No adnexal solid mass is seen. Other: There is no free air, free hemorrhage or free fluid. There are small umbilical fat hernias. There is no incarcerated abdominal wall hernia. Multiple pelvic phleboliths. Musculoskeletal: There is osteopenia. There is a compression fracture of the T10 vertebral body, with transverse oblique linear fracture line through the inferior vertebral body, buckling along the anterior and posterior cortex, trace retropulsion of the posteroinferior cortex and  approximately 15% loss of the overall vertebral body height. There is no extension into the pedicles and posterior elements. There is no associated herniated disc. There are degenerative changes of the lumbar spine greatest at L4-5 and L5-S1. Mild hip DJD and chondrocalcinosis. IMPRESSION: 1. No acute intracranial CT findings or depressed skull fractures. Calvarial osteopenia. 2. Small right occipitotemporal scalp hematoma, smaller than 2 days ago. 3. Osteopenia, degenerative changes, reversed lordosis in the cervical spine without evidence of fractures or traumatic listhesis. 4. Lower plate transverse oblique compression fracture of the T10 vertebral body with up to 15% loss of the overall vertebral body height, with cortical buckling anteriorly and posteriorly and trace retropulsion. No spinal canal hematoma is seen. 5. 8 mm irregular nodule in the right middle lobe. The usual recommendation per Fleischner guidelines would be for 6-12 month follow-up CT to ensure stability, with additional CT depending on whether or not the patient is high risk. In this case, follow-up imaging should be considered in light of the patient's advanced age and likelihood of intervention if the nodule shows growth. 6. 5 cm simple cyst right ovary. Three-month follow-up ultrasound for reassessment/recharacterization is suggested, taking into account advanced age. 7. No other acute CT findings. Chronic changes including constipation and diverticulosis. 8. Renal cysts and scarring with heavy calcification in the renal artery ostia. 9. 1.4 cm gallstone appears impacted in the gallbladder neck but there is no gallbladder wall thickening. 10. Aortic atherosclerosis. 11. Small rectocele protruding below the level of the pelvic floor. Additional evidence of pelvic floor laxity with low pelvic ureteral insertions but no cystocele. Electronically Signed   By: Telford Nab M.D.   On: 12/16/2021 21:36   CT Cervical Spine Wo Contrast  Result  Date: 12/16/2021 CLINICAL DATA:  [place] 3 days ago, with hyponatremia and confusion. Neck pain. EXAM: CT HEAD WITHOUT CONTRAST CT CERVICAL SPINE WITHOUT CONTRAST CT ABDOMEN AND PELVIS WITH CONTRAST TECHNIQUE: Contiguous axial images were obtained from the base of the skull through the vertex without intravenous contrast. Multidetector CT imaging of the cervical spine was performed without intravenous contrast. Multiplanar CT image reconstructions were also generated. Multidetector CT imaging of the abdomen and pelvis was performed using the standard protocol following bolus administration of intravenous contrast. RADIATION DOSE REDUCTION: This exam was performed according to the departmental dose-optimization program which includes automated exposure control, adjustment of the mA and/or kV according to patient size and/or use of iterative reconstruction technique. CONTRAST:  45m OMNIPAQUE IOHEXOL 300 MG/ML  SOLN COMPARISON:  Head CT and cervical spine CT both 12/14/2021. No prior abdomen pelvis CT. FINDINGS: CT HEAD FINDINGS Brain: There is moderately advanced cerebral atrophy with atrophic ventriculomegaly and mild cerebellar  atrophy. There is moderate to severe small vessel disease of the cerebral white matter. No asymmetry is seen concerning for an acute infarct, hemorrhage or mass. There is no midline shift. Basal cisterns are clear. Vascular: There are calcifications in the carotid siphons. No hyperdense central vessel is seen. Skull: Negative for fractures or focal lesions. 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 There is a small right occipitotemporal scalp hematoma which was larger previously. No new scalp hematoma is seen. Sinuses/Orbits: Old lens replacements. Visualized sinuses, bilateral mastoid air cells are clear. Other: None. CT CERVICAL SPINE FINDINGS Alignment: Stable findings. Reversed lordosis again noted centered at C5 and 2 mm grade 1 anterolisthesis C2-3, C3-4 and C4-5, trace retrolisthesis C5-6 and C6-7  all likely degenerative etiology. There is no widening of the anterior atlantodental joint. There is spurring along the C1-2 lateral mass articulations. Spurring of the anterior atlantodental joint. Skull base and vertebrae: Osteopenia. No fracture or pathologic bone lesion is seen. Soft tissues and spinal canal: No prevertebral fluid or swelling. No visible canal hematoma. A small left dorsal epidural calcified meningioma is again noted at C4-5, but there is no associated mass effect. Disc levels: There is advanced degenerative disc collapse with bidirectional osteophytes at C5-6 and C6-7, posterior disc osteophyte complexes flattening of the ventral cord surface at these levels. Other levels show lesser degenerative disc space loss minimal endplate spurring without mass effect. There is multilevel facet joint and uncinate hypertrophy with facet joint ankylosis on the left at C3-4. Degenerative foraminal stenosis is greatest on the left at C2-3 and C3-4, and on both sides at C4-5, and on the left again at C5-6 and C6-7. Upper chest: Negative. Other: None. CT ABDOMEN AND PELVIS FINDINGS Lower chest: There are atelectatic bands in the lower lobes and in the elevated right hemidiaphragm. There is an irregular 8 mm noncalcified nodule medially in the right middle lobe on series 4 axial image 2. Remaining lung bases are clear. There are scattered calcifications in the aortic valve leaflets and coronary arteries, mild cardiomegaly. Hepatobiliary: Breathing motion limits evaluation. There is no obvious mass. There is a 1.4 cm stone in the gallbladder neck, which may be impacted but there is no gallbladder thickening or biliary dilatation. Pancreas: No focal abnormality is seen through the breathing motion. Spleen: No focal abnormality is seen through the breathing motion. No splenomegaly. Adrenals/Urinary Tract: There is no adrenal mass. There is a wedge-shaped cortical scar laterally in the upper pole left kidney. There  is a homogeneous 2.1 cm thin walled cyst in the upper pole of the right kidney or 14.1 Hounsfield units, 9 mm low-density lesion medial to this in the right upper pole which is too small to characterize. No imaging follow-up is recommended. Remainder of both kidneys are unremarkable. There is no urinary stone or obstruction. There is no bladder thickening. The ureters insert low in the pelvis indicating pelvic floor laxity but there is no cystocele. Stomach/Bowel: The stomach is contracted. The small bowel is normal in caliber. An appendix is not seen in this patient. There is moderate stool retention particularly in the cecum and ascending colon, proximal transverse segment with left-sided diverticulosis without evidence of diverticulitis. Diverticulosis most advanced in the sigmoid segment. There is a small rectocele protruding just below the level of the pelvic floor. Vascular/Lymphatic: Aortic atherosclerosis. No enlarged abdominal or pelvic lymph nodes. Branch artery calcifications are noted, greatest in the renal artery ostia. Reproductive: Surgically absent uterus. The left ovary is unremarkable. The right ovary demonstrates a 5  cm thin walled homogeneous simple appearing cyst of 13.2 Hounsfield units. No adnexal solid mass is seen. Other: There is no free air, free hemorrhage or free fluid. There are small umbilical fat hernias. There is no incarcerated abdominal wall hernia. Multiple pelvic phleboliths. Musculoskeletal: There is osteopenia. There is a compression fracture of the T10 vertebral body, with transverse oblique linear fracture line through the inferior vertebral body, buckling along the anterior and posterior cortex, trace retropulsion of the posteroinferior cortex and approximately 15% loss of the overall vertebral body height. There is no extension into the pedicles and posterior elements. There is no associated herniated disc. There are degenerative changes of the lumbar spine greatest at L4-5  and L5-S1. Mild hip DJD and chondrocalcinosis. IMPRESSION: 1. No acute intracranial CT findings or depressed skull fractures. Calvarial osteopenia. 2. Small right occipitotemporal scalp hematoma, smaller than 2 days ago. 3. Osteopenia, degenerative changes, reversed lordosis in the cervical spine without evidence of fractures or traumatic listhesis. 4. Lower plate transverse oblique compression fracture of the T10 vertebral body with up to 15% loss of the overall vertebral body height, with cortical buckling anteriorly and posteriorly and trace retropulsion. No spinal canal hematoma is seen. 5. 8 mm irregular nodule in the right middle lobe. The usual recommendation per Fleischner guidelines would be for 6-12 month follow-up CT to ensure stability, with additional CT depending on whether or not the patient is high risk. In this case, follow-up imaging should be considered in light of the patient's advanced age and likelihood of intervention if the nodule shows growth. 6. 5 cm simple cyst right ovary. Three-month follow-up ultrasound for reassessment/recharacterization is suggested, taking into account advanced age. 7. No other acute CT findings. Chronic changes including constipation and diverticulosis. 8. Renal cysts and scarring with heavy calcification in the renal artery ostia. 9. 1.4 cm gallstone appears impacted in the gallbladder neck but there is no gallbladder wall thickening. 10. Aortic atherosclerosis. 11. Small rectocele protruding below the level of the pelvic floor. Additional evidence of pelvic floor laxity with low pelvic ureteral insertions but no cystocele. Electronically Signed   By: Telford Nab M.D.   On: 12/16/2021 21:36   CT ABDOMEN PELVIS W CONTRAST  Result Date: 12/16/2021 CLINICAL DATA:  [place] 3 days ago, with hyponatremia and confusion. Neck pain. EXAM: CT HEAD WITHOUT CONTRAST CT CERVICAL SPINE WITHOUT CONTRAST CT ABDOMEN AND PELVIS WITH CONTRAST TECHNIQUE: Contiguous axial images  were obtained from the base of the skull through the vertex without intravenous contrast. Multidetector CT imaging of the cervical spine was performed without intravenous contrast. Multiplanar CT image reconstructions were also generated. Multidetector CT imaging of the abdomen and pelvis was performed using the standard protocol following bolus administration of intravenous contrast. RADIATION DOSE REDUCTION: This exam was performed according to the departmental dose-optimization program which includes automated exposure control, adjustment of the mA and/or kV according to patient size and/or use of iterative reconstruction technique. CONTRAST:  26m OMNIPAQUE IOHEXOL 300 MG/ML  SOLN COMPARISON:  Head CT and cervical spine CT both 12/14/2021. No prior abdomen pelvis CT. FINDINGS: CT HEAD FINDINGS Brain: There is moderately advanced cerebral atrophy with atrophic ventriculomegaly and mild cerebellar atrophy. There is moderate to severe small vessel disease of the cerebral white matter. No asymmetry is seen concerning for an acute infarct, hemorrhage or mass. There is no midline shift. Basal cisterns are clear. Vascular: There are calcifications in the carotid siphons. No hyperdense central vessel is seen. Skull: Negative for fractures or focal  lesions. 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 There is a small right occipitotemporal scalp hematoma which was larger previously. No new scalp hematoma is seen. Sinuses/Orbits: Old lens replacements. Visualized sinuses, bilateral mastoid air cells are clear. Other: None. CT CERVICAL SPINE FINDINGS Alignment: Stable findings. Reversed lordosis again noted centered at C5 and 2 mm grade 1 anterolisthesis C2-3, C3-4 and C4-5, trace retrolisthesis C5-6 and C6-7 all likely degenerative etiology. There is no widening of the anterior atlantodental joint. There is spurring along the C1-2 lateral mass articulations. Spurring of the anterior atlantodental joint. Skull base and vertebrae:  Osteopenia. No fracture or pathologic bone lesion is seen. Soft tissues and spinal canal: No prevertebral fluid or swelling. No visible canal hematoma. A small left dorsal epidural calcified meningioma is again noted at C4-5, but there is no associated mass effect. Disc levels: There is advanced degenerative disc collapse with bidirectional osteophytes at C5-6 and C6-7, posterior disc osteophyte complexes flattening of the ventral cord surface at these levels. Other levels show lesser degenerative disc space loss minimal endplate spurring without mass effect. There is multilevel facet joint and uncinate hypertrophy with facet joint ankylosis on the left at C3-4. Degenerative foraminal stenosis is greatest on the left at C2-3 and C3-4, and on both sides at C4-5, and on the left again at C5-6 and C6-7. Upper chest: Negative. Other: None. CT ABDOMEN AND PELVIS FINDINGS Lower chest: There are atelectatic bands in the lower lobes and in the elevated right hemidiaphragm. There is an irregular 8 mm noncalcified nodule medially in the right middle lobe on series 4 axial image 2. Remaining lung bases are clear. There are scattered calcifications in the aortic valve leaflets and coronary arteries, mild cardiomegaly. Hepatobiliary: Breathing motion limits evaluation. There is no obvious mass. There is a 1.4 cm stone in the gallbladder neck, which may be impacted but there is no gallbladder thickening or biliary dilatation. Pancreas: No focal abnormality is seen through the breathing motion. Spleen: No focal abnormality is seen through the breathing motion. No splenomegaly. Adrenals/Urinary Tract: There is no adrenal mass. There is a wedge-shaped cortical scar laterally in the upper pole left kidney. There is a homogeneous 2.1 cm thin walled cyst in the upper pole of the right kidney or 14.1 Hounsfield units, 9 mm low-density lesion medial to this in the right upper pole which is too small to characterize. No imaging follow-up  is recommended. Remainder of both kidneys are unremarkable. There is no urinary stone or obstruction. There is no bladder thickening. The ureters insert low in the pelvis indicating pelvic floor laxity but there is no cystocele. Stomach/Bowel: The stomach is contracted. The small bowel is normal in caliber. An appendix is not seen in this patient. There is moderate stool retention particularly in the cecum and ascending colon, proximal transverse segment with left-sided diverticulosis without evidence of diverticulitis. Diverticulosis most advanced in the sigmoid segment. There is a small rectocele protruding just below the level of the pelvic floor. Vascular/Lymphatic: Aortic atherosclerosis. No enlarged abdominal or pelvic lymph nodes. Branch artery calcifications are noted, greatest in the renal artery ostia. Reproductive: Surgically absent uterus. The left ovary is unremarkable. The right ovary demonstrates a 5 cm thin walled homogeneous simple appearing cyst of 13.2 Hounsfield units. No adnexal solid mass is seen. Other: There is no free air, free hemorrhage or free fluid. There are small umbilical fat hernias. There is no incarcerated abdominal wall hernia. Multiple pelvic phleboliths. Musculoskeletal: There is osteopenia. There is a compression fracture of the  T10 vertebral body, with transverse oblique linear fracture line through the inferior vertebral body, buckling along the anterior and posterior cortex, trace retropulsion of the posteroinferior cortex and approximately 15% loss of the overall vertebral body height. There is no extension into the pedicles and posterior elements. There is no associated herniated disc. There are degenerative changes of the lumbar spine greatest at L4-5 and L5-S1. Mild hip DJD and chondrocalcinosis. IMPRESSION: 1. No acute intracranial CT findings or depressed skull fractures. Calvarial osteopenia. 2. Small right occipitotemporal scalp hematoma, smaller than 2 days ago. 3.  Osteopenia, degenerative changes, reversed lordosis in the cervical spine without evidence of fractures or traumatic listhesis. 4. Lower plate transverse oblique compression fracture of the T10 vertebral body with up to 15% loss of the overall vertebral body height, with cortical buckling anteriorly and posteriorly and trace retropulsion. No spinal canal hematoma is seen. 5. 8 mm irregular nodule in the right middle lobe. The usual recommendation per Fleischner guidelines would be for 6-12 month follow-up CT to ensure stability, with additional CT depending on whether or not the patient is high risk. In this case, follow-up imaging should be considered in light of the patient's advanced age and likelihood of intervention if the nodule shows growth. 6. 5 cm simple cyst right ovary. Three-month follow-up ultrasound for reassessment/recharacterization is suggested, taking into account advanced age. 7. No other acute CT findings. Chronic changes including constipation and diverticulosis. 8. Renal cysts and scarring with heavy calcification in the renal artery ostia. 9. 1.4 cm gallstone appears impacted in the gallbladder neck but there is no gallbladder wall thickening. 10. Aortic atherosclerosis. 11. Small rectocele protruding below the level of the pelvic floor. Additional evidence of pelvic floor laxity with low pelvic ureteral insertions but no cystocele. Electronically Signed   By: Telford Nab M.D.   On: 12/16/2021 21:36   CT L-SPINE NO CHARGE  Result Date: 12/16/2021 CLINICAL DATA:  Fall 3 days prior.  Hyponatremia. EXAM: CT LUMBAR SPINE WITHOUT CONTRAST TECHNIQUE: Multidetector CT imaging of the lumbar spine was performed without intravenous contrast administration. Multiplanar CT image reconstructions were also generated. RADIATION DOSE REDUCTION: This exam was performed according to the departmental dose-optimization program which includes automated exposure control, adjustment of the mA and/or kV  according to patient size and/or use of iterative reconstruction technique. COMPARISON:  Lumbar spine radiographs 12/14/2021 FINDINGS: Segmentation: There are 5 non-rib-bearing lumbar-type vertebral bodies. Alignment: No sagittal spondylolisthesis. Minimal right lateral listhesis of L4 on L5. No significant scoliotic curvature. Vertebrae: No lumbar vertebral body height loss. Please see contemporaneous CT thoracic spine report for evaluation of acute to subacute T10 vertebral body compression fracture. Severe left-greater-than-right L4-5 and severe right and moderate left L5-S1 disc space narrowing, endplate sclerosis, and peripheral osteophytosis. Paraspinal and other soft tissues: Moderate atherosclerotic calcifications within the abdominal aorta and iliac arteries. Tiny low density lesion within the partially visualized anterior right kidney measuring up to 11 x 9 mm, too small to further characterize but statistically most likely a cyst. No follow-up imaging is recommended. Minimally partially visualized distal colonic diverticulosis. Disc levels: L1-2: Mild-to-moderate bilateral facet joint hypertrophy. No central canal or neuroforaminal stenosis. L2-3: Mild-to-moderate bilateral facet joint hypertrophy. Mild posterior endplate spurring. Mild left-greater-than-right neuroforaminal stenosis. Moderate narrowing of the lateral recesses and mild central canal stenosis. L3-4: Moderate bilateral facet joint hypertrophy. Mild broad-based posterior disc osteophyte complex. Borderline mild right neuroforaminal stenosis. Moderate to severe central canal stenosis. L4-5: Moderate bilateral facet joint hypertrophy. Mild broad-based posterior disc osteophyte  complex with moderate left intraforaminal endplate spurring. Moderate left and mild right neuroforaminal stenosis. Moderate narrowing of the lateral recesses and moderate central canal stenosis. L5-S1: Moderate bilateral facet joint hypertrophy. Minimal broad-based  posterior disc osteophyte complex with moderate right intraforaminal endplate spurring. Mild-to-moderate right neuroforaminal stenosis. No central canal stenosis. IMPRESSION: 1. Please see contemporaneous CT of the thoracic spine for description of an acute to subacute inferior T10 vertebral body compression fracture. 2. No lumbar vertebral body acute fracture. 3. Multilevel degenerative disc and joint changes as above. Mild L2-3, moderate to severe L3-4, and moderate L4-5 central canal stenosis. 4. Multilevel neuroforaminal stenosis as above. The greatest levels include moderate left L4-5 and mild-to-moderate right L5-S1 neuroforaminal stenosis. Electronically Signed   By: Yvonne Kendall M.D.   On: 12/16/2021 21:32   CT Thoracic Spine Wo Contrast  Result Date: 12/16/2021 CLINICAL DATA:  Back trauma.  Fall 3 days prior.  Hyponatremia. EXAM: CT THORACIC SPINE WITHOUT CONTRAST TECHNIQUE: Multidetector CT images of the thoracic were obtained using the standard protocol without intravenous contrast. RADIATION DOSE REDUCTION: This exam was performed according to the departmental dose-optimization program which includes automated exposure control, adjustment of the mA and/or kV according to patient size and/or use of iterative reconstruction technique. COMPARISON:  Thoracic spine radiographs 12/14/2021 FINDINGS: Alignment: No sagittal spondylolisthesis. Vertebrae: There is minimal approximate 5% height loss of the mid T10 vertebral body. This was seen on recent 12/14/2021 radiographs. There is an acute to subacute horizontal linear fracture line extending from the anterior mid to inferior T10 vertebral body cortex through the posteroinferior vertebral body cortex. Mild 3 mm anterior and 3 mm posterior T10 vertebral body cortical buckling (sagittal series 6, image 23). There is approximate 30% height loss of the mid to anterior T4 vertebral body, similar to recent 12/15/2021 radiographs. There is chronic sclerosis  within the T4 superior endplate. No acute fracture line is seen. No significant retropulsion. Mild T3-4 through T6-7 disc space narrowing. Paraspinal and other soft tissues: Mild-to-moderate atherosclerotic calcifications within the partially visualized thoracic aorta. Mild curvilinear likely subsegmental atelectasis within the partially visualized medial aspect of the lungs. Coronary artery calcifications are noted. Disc levels: T10-11: Mild retropulsion of the posteroinferior T10 vertebral body. Mild bilateral facet joint hypertrophy. Moderate right and borderline mild left neuroforaminal stenosis. No central canal stenosis. IMPRESSION: 1. Acute to subacute T10 vertebral body compression fracture with minimal 5 mm height loss. There is mild posteroinferior T10 vertebral body retropulsion. Moderate right and borderline mild left T10-11 neuroforaminal stenosis. 2. Chronic appearing 30% height loss of the mid to anterior T4 vertebral body. Aortic Atherosclerosis (ICD10-I70.0). Electronically Signed   By: Yvonne Kendall M.D.   On: 12/16/2021 21:20      Time coordinating discharge: over 30 minutes  SIGNED:  Emeterio Reeve DO [facility]

## 2022-01-15 ENCOUNTER — Other Ambulatory Visit: Payer: Self-pay

## 2022-01-15 DIAGNOSIS — S22070A Wedge compression fracture of T9-T10 vertebra, initial encounter for closed fracture: Secondary | ICD-10-CM

## 2022-01-15 NOTE — Progress Notes (Signed)
Referring Physician:  Tracie Harrier, MD [address] [facility] [place],  [place] [address]  Primary Physician:  Tracie Harrier, MD  History of Present Illness: 01/15/2022 Lisa Koch was seen for hospital consult by Dr. Izora Ribas on 12/17/21 for T10 acute compression fracture. Also with known chronic T4 compression fracture. She was admitted for hyponatremia, weakness, fatigue, and confusion. He recommended TLSO brace and she is here for follow up.   She is not wearing her brace. She has no back pain. No leg pain. No numbness, tingling, or weakness. She is taking prn tylenol and hasn't taken that in last 10 days or so.   She is very hard of hearing. Her son and daughter in law help with her history.   Review of Systems:  A 10 point review of systems is negative, except for the pertinent positives and negatives detailed in the HPI.  Past Medical History: No past medical history on file.  Past Surgical History: Past Surgical History:  Procedure Laterality Date   BREAST BIOPSY Left 2012   core - neg   BREAST BIOPSY Left 2013   excisional - neg   BREAST EXCISIONAL BIOPSY      Allergies: Allergies as of 01/16/2022 - Review Complete 12/17/2021  Allergen Reaction Noted   Nabumetone Swelling 08/17/2013   Clindamycin Other (See Comments) 01/08/2016    Medications: Outpatient Encounter Medications as of 01/16/2022  Medication Sig   aspirin 81 MG EC tablet Take 81 mg by mouth daily.   diclofenac Sodium (VOLTAREN) 1 % GEL Apply 4 g topically 4 (four) times daily as needed (MUSCLE/JOINT PAIN).   folic acid (FOLVITE) 1 MG tablet Take 2 mg by mouth daily.   lisinopril (ZESTRIL) 40 MG tablet Take 40 mg by mouth daily.   meloxicam (MOBIC) 7.5 MG tablet Take 7.5 mg by mouth daily as needed for pain.   methotrexate (RHEUMATREX) 2.5 MG tablet Take 10 mg by mouth once a week.   omeprazole (PRILOSEC) 10 MG capsule Take 10 mg by mouth daily.   polyethylene  glycol (MIRALAX / GLYCOLAX) 17 g packet Take 17 g by mouth 2 (two) times daily. Until stooling regularly   Potassium Chloride ER 20 MEQ TBCR Take 20 mEq by mouth daily.   senna-docusate (SENOKOT-S) 8.6-50 MG tablet Take 2 tablets by mouth 2 (two) times daily. Until stooling regularly   verapamil (CALAN-SR) 240 MG CR tablet Take 240 mg by mouth daily.   No facility-administered encounter medications on file as of 01/16/2022.    Social History: Social History   Tobacco Use   Smoking status: Never   Smokeless tobacco: Never  Substance Use Topics   Alcohol use: Never   Drug use: Never    Family Medical History: Family History  Problem Relation Age of Onset   Breast cancer Neg Hx     Physical Examination: There were no vitals filed for this visit.  General: Patient is well developed, well nourished, calm, collected, and in no apparent distress. Attention to examination is appropriate.  Respiratory: Patient is breathing without any difficulty.   NEUROLOGICAL:     Awake, alert, oriented to person, place, and time.  Speech is clear and fluent. Fund of knowledge is appropriate. Very hard of hearing.   Cranial Nerves: Pupils equal round and reactive to light.  Facial tone is symmetric.  Facial sensation is symmetric.  ROM TL spine not tested.  No tenderness TL junction.   No abnormal lesions on exposed skin.  Strength: Side Biceps Triceps Deltoid Interossei Grip Wrist Ext. Wrist Flex.  R '5 5 5 5 5 5 5  '$ L '5 5 5 5 5 5 5   '$ Side Iliopsoas Quads Hamstring PF DF EHL  R '5 5 5 5 5 5  '$ L '5 5 5 5 5 5   '$ Reflexes are 2+ and symmetric at the biceps, triceps, brachioradialis, patella and achilles.   Hoffman's is absent.  Clonus is not present.   Bilateral upper and lower extremity sensation is intact to light touch.     Gait not tested.      Medical Decision Making  Imaging: Thoracic CT scan dated 12/16/21:  FINDINGS: Alignment: No sagittal spondylolisthesis.   Vertebrae:  There is minimal approximate 5% height loss of the mid T10 vertebral body. This was seen on recent 12/14/2021 radiographs. There is an acute to subacute horizontal linear fracture line extending from the anterior mid to inferior T10 vertebral body cortex through the posteroinferior vertebral body cortex. Mild 3 mm anterior and 3 mm posterior T10 vertebral body cortical buckling (sagittal series 6, image 23).   There is approximate 30% height loss of the mid to anterior T4 vertebral body, similar to recent 12/15/2021 radiographs. There is chronic sclerosis within the T4 superior endplate. No acute fracture line is seen. No significant retropulsion.   Mild T3-4 through T6-7 disc space narrowing.   Paraspinal and other soft tissues: Mild-to-moderate atherosclerotic calcifications within the partially visualized thoracic aorta. Mild curvilinear likely subsegmental atelectasis within the partially visualized medial aspect of the lungs. Coronary artery calcifications are noted.   Disc levels:   T10-11: Mild retropulsion of the posteroinferior T10 vertebral body. Mild bilateral facet joint hypertrophy. Moderate right and borderline mild left neuroforaminal stenosis. No central canal stenosis.   IMPRESSION: 1. Acute to subacute T10 vertebral body compression fracture with minimal 5 mm height loss. There is mild posteroinferior T10 vertebral body retropulsion. Moderate right and borderline mild left T10-11 neuroforaminal stenosis. 2. Chronic appearing 30% height loss of the mid to anterior T4 vertebral body.   Aortic Atherosclerosis (ICD10-I70.0).     Electronically Signed   By: Yvonne Kendall M.D.   On: 12/16/2021 21:20   I have personally reviewed the images and agree with the above interpretation.  Assessment and Plan: Lisa Koch is a pleasant 88 y.o. female with improvement in her pain from T10 compression fracture on 12/17/21. She has not worn brace at all. She has no back  pain. No leg pain. No numbness, tingling, or weakness.   No new imaging to review. CT from the hospital showed acute T10 compression fracture.   Treatment options discussed with patient and her family. Following plan made:   - She will get thoracic xrays today. Will call her daughter in law Lisa Koch with results (okay per patient). [phone] - Recommend she wear TLSO brace when up and walking.  - No bending, twisting, or lifting.  - BP was elevated, it was 168/82. Recheck was 170/83. No symptoms of chest pain, shortness of breath, blurry vision, or headaches. She checks BP at home and it generally runs normal. Will recheck at home and call PCP if not improved. If she develops CP, SOB, blurry vision, or headaches, then she will go to ED. Reviewed with her son and daughter in law.  - Follow up in 4 weeks with repeat AP/lat thoracic xrays.   I spent a total of 30 minutes in face-to-face and non-face-to-face activities related to this patient's care toda  including review of outside records, review of imaging, review of symptoms, physical exam, discussion of differential diagnosis, discussion of treatment options, and documentation.   Thank you for involving me in the care of this patient.   Geronimo Boot PA-C Dept. of Neurosurgery

## 2022-01-16 ENCOUNTER — Ambulatory Visit
Admission: RE | Admit: 2022-01-16 | Discharge: 2022-01-16 | Disposition: A | Discharge status: Home / Self Care | Payer: Medicare HMO | Attending: Orthopedic Surgery | Admitting: Orthopedic Surgery

## 2022-01-16 ENCOUNTER — Encounter: Payer: Self-pay | Admitting: Orthopedic Surgery

## 2022-01-16 ENCOUNTER — Ambulatory Visit
Admission: RE | Admit: 2022-01-16 | Discharge: 2022-01-16 | Disposition: A | Discharge status: Home / Self Care | Payer: Medicare HMO | Source: Ambulatory Visit | Attending: Orthopedic Surgery | Admitting: Orthopedic Surgery

## 2022-01-16 ENCOUNTER — Ambulatory Visit: Payer: Medicare HMO | Admitting: Orthopedic Surgery

## 2022-01-16 VITALS — BP 170/83 | HR 64 | Wt 124.0 lb

## 2022-01-16 DIAGNOSIS — S22070A Wedge compression fracture of T9-T10 vertebra, initial encounter for closed fracture: Secondary | ICD-10-CM

## 2022-01-16 DIAGNOSIS — S22070D Wedge compression fracture of T9-T10 vertebra, subsequent encounter for fracture with routine healing: Secondary | ICD-10-CM | POA: Diagnosis not present

## 2022-01-16 NOTE — Patient Instructions (Signed)
It was so nice to see you today, I am glad you are feeling better.   You have a compression fracture (broken bone) at T10.   Please get xrays today- will call Ms. Freda Munro with results.   No bending, twisting, or lifting.   I recommend that you wear your TLSO brace.   I will see you back in 4 weeks (come 30-40 minutes early to get xrays).   Please do not hesitate to call if you have any questions or concerns. You can also message me in [place].   Geronimo Boot PA-C [phone]

## 2022-02-06 ENCOUNTER — Encounter: Payer: Self-pay | Admitting: Emergency Medicine

## 2022-02-06 ENCOUNTER — Inpatient Hospital Stay
Admission: EM | Admit: 2022-02-06 | Discharge: 2022-02-11 | DRG: 481 | Disposition: A | Discharge status: Skilled Nursing Facility | Payer: Medicare HMO | Attending: Internal Medicine | Admitting: Internal Medicine

## 2022-02-06 ENCOUNTER — Emergency Department: Payer: Medicare HMO

## 2022-02-06 ENCOUNTER — Other Ambulatory Visit: Payer: Self-pay

## 2022-02-06 DIAGNOSIS — Z66 Do not resuscitate: Secondary | ICD-10-CM | POA: Diagnosis present

## 2022-02-06 DIAGNOSIS — S43014A Anterior dislocation of right humerus, initial encounter: Secondary | ICD-10-CM | POA: Diagnosis present

## 2022-02-06 DIAGNOSIS — S72141A Displaced intertrochanteric fracture of right femur, initial encounter for closed fracture: Secondary | ICD-10-CM | POA: Diagnosis not present

## 2022-02-06 DIAGNOSIS — D62 Acute posthemorrhagic anemia: Secondary | ICD-10-CM | POA: Insufficient documentation

## 2022-02-06 DIAGNOSIS — L4050 Arthropathic psoriasis, unspecified: Secondary | ICD-10-CM | POA: Diagnosis present

## 2022-02-06 DIAGNOSIS — S72001A Fracture of unspecified part of neck of right femur, initial encounter for closed fracture: Secondary | ICD-10-CM | POA: Diagnosis present

## 2022-02-06 DIAGNOSIS — Z79899 Other long term (current) drug therapy: Secondary | ICD-10-CM

## 2022-02-06 DIAGNOSIS — Z888 Allergy status to other drugs, medicaments and biological substances status: Secondary | ICD-10-CM

## 2022-02-06 DIAGNOSIS — F419 Anxiety disorder, unspecified: Secondary | ICD-10-CM | POA: Diagnosis present

## 2022-02-06 DIAGNOSIS — I160 Hypertensive urgency: Secondary | ICD-10-CM | POA: Diagnosis present

## 2022-02-06 DIAGNOSIS — Z90710 Acquired absence of both cervix and uterus: Secondary | ICD-10-CM

## 2022-02-06 DIAGNOSIS — I340 Nonrheumatic mitral (valve) insufficiency: Secondary | ICD-10-CM | POA: Diagnosis present

## 2022-02-06 DIAGNOSIS — H9190 Unspecified hearing loss, unspecified ear: Secondary | ICD-10-CM | POA: Diagnosis present

## 2022-02-06 DIAGNOSIS — W19XXXA Unspecified fall, initial encounter: Principal | ICD-10-CM

## 2022-02-06 DIAGNOSIS — Z7982 Long term (current) use of aspirin: Secondary | ICD-10-CM

## 2022-02-06 DIAGNOSIS — S42251A Displaced fracture of greater tuberosity of right humerus, initial encounter for closed fracture: Secondary | ICD-10-CM | POA: Diagnosis present

## 2022-02-06 DIAGNOSIS — Z86010 Personal history of colonic polyps: Secondary | ICD-10-CM

## 2022-02-06 DIAGNOSIS — M069 Rheumatoid arthritis, unspecified: Secondary | ICD-10-CM | POA: Diagnosis present

## 2022-02-06 DIAGNOSIS — I10 Essential (primary) hypertension: Secondary | ICD-10-CM | POA: Diagnosis present

## 2022-02-06 DIAGNOSIS — S4291XA Fracture of right shoulder girdle, part unspecified, initial encounter for closed fracture: Secondary | ICD-10-CM | POA: Diagnosis present

## 2022-02-06 DIAGNOSIS — Z881 Allergy status to other antibiotic agents status: Secondary | ICD-10-CM

## 2022-02-06 DIAGNOSIS — Z85828 Personal history of other malignant neoplasm of skin: Secondary | ICD-10-CM

## 2022-02-06 DIAGNOSIS — W010XXA Fall on same level from slipping, tripping and stumbling without subsequent striking against object, initial encounter: Secondary | ICD-10-CM | POA: Diagnosis present

## 2022-02-06 LAB — CBC WITH DIFFERENTIAL/PLATELET
Abs Immature Granulocytes: 0.15 10*3/uL — ABNORMAL HIGH (ref 0.00–0.07)
Basophils Absolute: 0.1 10*3/uL (ref 0.0–0.1)
Basophils Relative: 1 %
Eosinophils Absolute: 0.2 10*3/uL (ref 0.0–0.5)
Eosinophils Relative: 2 %
HCT: 37.9 % (ref 36.0–46.0)
Hemoglobin: 12.5 g/dL (ref 12.0–15.0)
Immature Granulocytes: 1 %
Lymphocytes Relative: 20 %
Lymphs Abs: 2.4 10*3/uL (ref 0.7–4.0)
MCH: 32.6 pg (ref 26.0–34.0)
MCHC: 33 g/dL (ref 30.0–36.0)
MCV: 99 fL (ref 80.0–100.0)
Monocytes Absolute: 1.1 10*3/uL — ABNORMAL HIGH (ref 0.1–1.0)
Monocytes Relative: 9 %
Neutro Abs: 8.1 10*3/uL — ABNORMAL HIGH (ref 1.7–7.7)
Neutrophils Relative %: 67 %
Platelets: 248 10*3/uL (ref 150–400)
RBC: 3.83 MIL/uL — ABNORMAL LOW (ref 3.87–5.11)
RDW: 14.1 % (ref 11.5–15.5)
WBC: 12 10*3/uL — ABNORMAL HIGH (ref 4.0–10.5)
nRBC: 0 % (ref 0.0–0.2)

## 2022-02-06 LAB — BASIC METABOLIC PANEL
Anion gap: 10 (ref 5–15)
BUN: 21 mg/dL (ref 8–23)
CO2: 23 mmol/L (ref 22–32)
Calcium: 9.5 mg/dL (ref 8.9–10.3)
Chloride: 104 mmol/L (ref 98–111)
Creatinine, Ser: 0.74 mg/dL (ref 0.44–1.00)
GFR, Estimated: >60 mL/min (ref >60)
Glucose, Bld: 109 mg/dL — ABNORMAL HIGH (ref 70–99)
Potassium: 3.5 mmol/L (ref 3.5–5.1)
Sodium: 137 mmol/L (ref 135–145)

## 2022-02-06 LAB — PROTIME-INR
INR: 1 (ref 0.8–1.2)
Prothrombin Time: 13.3 seconds (ref 11.4–15.2)

## 2022-02-06 MED ORDER — FENTANYL CITRATE PF 50 MCG/ML IJ SOSY
50.0000 ug | PREFILLED_SYRINGE | Freq: Once | INTRAMUSCULAR | Status: AC
  Administered 2022-02-07: 50 ug via INTRAVENOUS
  Filled 2022-02-06: qty 1

## 2022-02-06 MED ORDER — BACITRACIN ZINC 500 UNIT/GM EX OINT: TOPICAL_OINTMENT | Freq: Once | CUTANEOUS | Status: DC

## 2022-02-06 MED ORDER — ONDANSETRON HCL 4 MG/2ML IJ SOLN
4.0000 mg | Freq: Once | INTRAMUSCULAR | Status: AC
  Administered 2022-02-07: 4 mg via INTRAVENOUS
  Filled 2022-02-06: qty 2

## 2022-02-06 MED ORDER — SODIUM CHLORIDE 0.9 % IV SOLN: INTRAVENOUS | Status: DC

## 2022-02-06 NOTE — ED Provider Notes (Signed)
[facility] Provider Note    Event Date/Time   First MD Initiated Contact with Patient 02/06/22 2309     (approximate)   History   Fall   HPI  MATEYA TORTI is a 88 y.o. female history of hypertension, psoriatic arthritis who presents to the emergency department with family after a fall.  Complaining of right humerus, right elbow where she has a skin tear, right hip pain.  Right hip is shortened and externally rotated.  Last tetanus vaccination was in June 2022.  Unclear if she hit her head.  She is on aspirin but no other antiplatelet and no anticoagulants.  No neck or back pain, chest or abdominal pain.  Lives at home.  Uses a walker intermittently.  Family reports she is a DNR.  Patient reports she fell when she was hanging up her jeans.  It sounds like she lost her balance.   History provided by patient and family.    Past Medical History:  Diagnosis Date   Anemia    Basal cell carcinoma    Benign neoplasm of colon    Diverticulosis    Hemorrhage of rectum and anus    Hx of adenomatous colonic polyps    Hypertension    Inflammatory arthritis    Inflammatory polyarthropathy (HCC)    Mitral insufficiency    Psoriatic arthropathy ([place])    Senile osteoporosis     Past Surgical History:  Procedure Laterality Date   ABDOMINAL HYSTERECTOMY     BREAST BIOPSY Left 2012   core - neg   BREAST BIOPSY Left 2013   excisional - neg   BREAST EXCISIONAL BIOPSY     BREAST EXCISIONAL BIOPSY     COLONOSCOPY     ESOPHAGOGASTRODUODENOSCOPY     SKIN BIOPSY      MEDICATIONS:  Prior to Admission medications   Medication Sig Start Date End Date Taking? Authorizing Provider  aspirin 81 MG EC tablet Take 81 mg by mouth daily.    [provider]  CALCIUM PO Take by mouth daily.    [provider]  diclofenac Sodium (VOLTAREN) 1 % GEL Apply 4 g topically 4 (four) times daily as needed (MUSCLE/JOINT PAIN). 12/22/21   Emeterio Reeve, DO   folic acid (FOLVITE) 1 MG tablet Take 2 mg by mouth daily.    [provider]  lisinopril (ZESTRIL) 40 MG tablet Take 40 mg by mouth daily. 06/19/20   [provider]  methotrexate (RHEUMATREX) 2.5 MG tablet Take 10 mg by mouth once a week. 08/05/20   [provider]  Multiple Vitamin (MULTIVITAMIN) capsule Take 1 capsule by mouth daily.    [provider]  Multiple Vitamins-Minerals (PRESERVISION AREDS PO) Take 2 tablets by mouth daily.    [provider]  omeprazole (PRILOSEC) 10 MG capsule Take 10 mg by mouth daily. 07/03/20   [provider]  Potassium Chloride ER 20 MEQ TBCR Take 20 mEq by mouth daily. 06/20/20   [provider]  senna-docusate (SENOKOT-S) 8.6-50 MG tablet Take 2 tablets by mouth 2 (two) times daily. Until stooling regularly 12/22/21   Emeterio Reeve, DO  verapamil (CALAN-SR) 240 MG CR tablet Take 240 mg by mouth daily. 06/17/20   [provider]    Physical Exam   Triage Vital Signs: ED Triage Vitals  Enc Vitals Group     BP 02/06/22 2247 (!) 205/92     Pulse Rate 02/06/22 2247 73     Resp  02/06/22 2247 (!) 24     Temp 02/06/22 2247 97.7 F (36.5 C)     Temp Source 02/06/22 2247 Oral     SpO2 02/06/22 2247 93 %     Weight 02/06/22 2301 124 lb (56.2 kg)     Height 02/06/22 2301 5' (1.524 m)     Head Circumference --      Peak Flow --      Pain Score 02/06/22 2301 5     Pain Loc --      Pain Edu? --      Excl. in [place]? --     Most recent vital signs: Vitals:   02/07/22 0159 02/07/22 0245  BP: (!) 172/91 (!) 153/70  Pulse: (!) 101 95  Resp: 15 (!) 24  Temp:    SpO2: 100% 100%     CONSTITUTIONAL: Alert and oriented and responds appropriately to questions.  Elderly, very hard of hearing, in no acute distress HEAD: Normocephalic; atraumatic EYES: Conjunctivae clear, PERRL, EOMI ENT: normal nose; no rhinorrhea; moist mucous membranes; pharynx without lesions noted; no dental injury; no  septal hematoma, no epistaxis; no facial deformity or bony tenderness NECK: Supple, no midline spinal tenderness, step-off or deformity; trachea midline CARD: RRR; S1 and S2 appreciated; no murmurs, no clicks, no rubs, no gallops RESP: Normal chest excursion without splinting or tachypnea; breath sounds clear and equal bilaterally; no wheezes, no rhonchi, no rales; no hypoxia or respiratory distress CHEST:  chest wall stable, no crepitus or ecchymosis or deformity, nontender to palpation; no flail chest ABD/GI: Normal bowel sounds; non-distended; soft, non-tender, no rebound, no guarding; no ecchymosis or other lesions noted PELVIS:  stable, nontender to palpation BACK:  The back appears normal; no midline spinal tenderness, step-off or deformity EXT: Tender over the right anterior lateral hip.  Right leg is shortened and externally rotated.  She has 2+ radial and DP pulses bilaterally.  Compartments are soft.  She is also tender over the proximal right forearm and proximal right humerus.  She has a skin tear over the posterior right elbow. SKIN: Normal color for age and race; warm NEURO: No facial asymmetry, normal speech, moving all extremities equally  ED Results / Procedures / Treatments   LABS: (all labs ordered are listed, but only abnormal results are displayed) Labs Reviewed  CBC WITH DIFFERENTIAL/PLATELET - Abnormal; Notable for the following components:      Result Value   WBC 12.0 (*)    RBC 3.83 (*)    Neutro Abs 8.1 (*)    Monocytes Absolute 1.1 (*)    Abs Immature Granulocytes 0.15 (*)    All other components within normal limits  BASIC METABOLIC PANEL - Abnormal; Notable for the following components:   Glucose, Bld 109 (*)    All other components within normal limits  PROTIME-INR  URINALYSIS, ROUTINE W REFLEX MICROSCOPIC  CBC  BASIC METABOLIC PANEL  MAGNESIUM  TYPE AND SCREEN  TYPE AND SCREEN     EKG:  EKG Interpretation  Date/Time:  Friday February 06 2022  22:46:01 EST Ventricular Rate:  84 PR Interval:  168 QRS Duration: 103 QT Interval:  392 QTC Calculation: 455 R Axis:   -35 Text Interpretation: Sinus rhythm Atrial premature complexes Low voltage, extremity and precordial leads Confirmed by Pryor Curia [phone]) on 02/07/2022 12:14:50 AM          RADIOLOGY: My personal review and interpretation of imaging: X-ray of the right shoulder shows anterior dislocation.  She has a  right intertrochanteric hip fracture.  CT head and cervical spine unremarkable.  I have personally reviewed all radiology reports. DG Shoulder Right Portable  Result Date: 02/07/2022 CLINICAL DATA:  Status post reduction EXAM: RIGHT SHOULDER - 1 VIEW COMPARISON:  Films from earlier in the same day. FINDINGS: Humeral head is been reduced into the glenoid. Previously seen comminuted fractures involving the greater tuberosity are again noted and less displaced. No new fracture is seen. IMPRESSION: Status post reduction. Electronically Signed   By: Inez Catalina M.D.   On: 02/07/2022 02:09   DG Chest Portable 1 View  Result Date: 02/07/2022 CLINICAL DATA:  Status post fall. EXAM: PORTABLE CHEST 1 VIEW COMPARISON:  None Available. FINDINGS: The heart size and mediastinal contours are within normal limits. Low lung volumes are seen with stable moderate to marked severity elevation of the right hemidiaphragm. There is no evidence of an acute infiltrate, pleural effusion or pneumothorax. Chronic sixth and seventh left rib fractures are seen. There is anterior dislocation of the right shoulder with an acute fracture of the greater tuberosity of the right humeral head. IMPRESSION: 1. Low lung volumes without acute cardiopulmonary disease. 2. Anterior dislocation of the right shoulder with and acute fracture of the greater tuberosity of the right humeral head. Electronically Signed   By: Virgina Norfolk M.D.   On: 02/07/2022 00:22   DG Forearm Right  Result Date:  02/07/2022 CLINICAL DATA:  Status post fall. EXAM: RIGHT FOREARM - 2 VIEW COMPARISON:  None Available. FINDINGS: There is no evidence of an acute fracture or dislocation. Chronic and degenerative changes are seen involving the lateral epicondyle of the distal right humerus. Soft tissues are unremarkable. IMPRESSION: Chronic and degenerative changes without evidence of an acute osseous abnormality. Electronically Signed   By: Virgina Norfolk M.D.   On: 02/07/2022 00:20   DG Hip Unilat  With Pelvis 2-3 Views Right  Result Date: 02/07/2022 CLINICAL DATA:  Status post fall. EXAM: DG HIP (WITH OR WITHOUT PELVIS) 2-3V RIGHT COMPARISON:  None Available. FINDINGS: There is an acute, comminuted fracture deformity seen extending through the inter trochanteric region of the proximal right femur. There is no evidence of dislocation. Moderate severity degenerative changes are seen in the form of joint space narrowing and acetabular sclerosis. IMPRESSION: Acute fracture of the proximal right femur. Electronically Signed   By: Virgina Norfolk M.D.   On: 02/07/2022 00:18   DG Humerus Right  Result Date: 02/07/2022 CLINICAL DATA:  Status post fall. EXAM: RIGHT HUMERUS - 2+ VIEW COMPARISON:  None Available. FINDINGS: There is anterior dislocation of the right humeral head with respect to the right glenoid. An acute, displaced fracture is also seen involving the greater tuberosity of the right humeral head. Moderate severity soft tissue swelling is seen along the lateral aspect of the proximal to mid right humerus. IMPRESSION: Anterior dislocation of the right shoulder with an acute fracture of the greater tuberosity of the right humeral head. Electronically Signed   By: Virgina Norfolk M.D.   On: 02/07/2022 00:17   CT HEAD WO CONTRAST (5MM)  Result Date: 02/07/2022 CLINICAL DATA:  Head trauma, minor (Age >= 65y); Neck trauma (Age >= 65y). Per EMS pt has been staying with family for "rehab" after previous fall.  Per EMS pt was walking and fell face first. Per EMS pt with +shortening and rotation of R leg and possible deformation EXAM: CT HEAD WITHOUT CONTRAST CT CERVICAL SPINE WITHOUT CONTRAST TECHNIQUE: Multidetector CT imaging of the head and  cervical spine was performed following the standard protocol without intravenous contrast. Multiplanar CT image reconstructions of the cervical spine were also generated. RADIATION DOSE REDUCTION: This exam was performed according to the departmental dose-optimization program which includes automated exposure control, adjustment of the mA and/or kV according to patient size and/or use of iterative reconstruction technique. COMPARISON:  CT head and C-spine 12/16/2021 FINDINGS: CT HEAD FINDINGS BRAIN: BRAIN Cerebral ventricle sizes are concordant with the degree of cerebral volume loss. Patchy and confluent areas of decreased attenuation are noted throughout the deep and periventricular white matter of the cerebral hemispheres bilaterally, compatible with chronic microvascular ischemic disease. No evidence of large-territorial acute infarction. No parenchymal hemorrhage. No mass lesion. No extra-axial collection. No mass effect or midline shift. No hydrocephalus. Basilar cisterns are patent. Vascular: No hyperdense vessel. Skull: No acute fracture or focal lesion. Sinuses/Orbits: Mucosal thickening of the maxillary sinuses. Paranasal sinuses and mastoid air cells are clear. Bilateral lens replacement. Otherwise the orbits are unremarkable. Other: None. CT CERVICAL SPINE FINDINGS Alignment: Grade 1 anterolisthesis of C2 on C3, C3 on C4, C4 on C5. Mild retrolisthesis of C5 on C6. Skull base and vertebrae: Multilevel moderate severe degenerative changes spine most prominent at the C5-C6 level. Posterior longitudinal ligament calcification (2:45). Associated multilevel severe osseous neural foraminal stenosis. No severe osseous central canal stenosis. No acute fracture. No aggressive  appearing focal osseous lesion or focal pathologic process. Soft tissues and spinal canal: No prevertebral fluid or swelling. No visible canal hematoma. Upper chest: Unremarkable. Other: Atherosclerotic plaque. Subcentimeter left thyroid gland hypodense nodule. Not clinically significant; no follow-up imaging recommended (ref: J Am Coll Radiol. 2015 Feb;12(2): 143-50). IMPRESSION: 1. No acute intracranial abnormality. 2. No acute displaced fracture or traumatic listhesis of the cervical spine. 3.  Aortic Atherosclerosis (ICD10-I70.0). Electronically Signed   By: Iven Finn M.D.   On: 02/07/2022 00:01   CT Cervical Spine Wo Contrast  Result Date: 02/07/2022 CLINICAL DATA:  Head trauma, minor (Age >= 65y); Neck trauma (Age >= 65y). Per EMS pt has been staying with family for "rehab" after previous fall. Per EMS pt was walking and fell face first. Per EMS pt with +shortening and rotation of R leg and possible deformation EXAM: CT HEAD WITHOUT CONTRAST CT CERVICAL SPINE WITHOUT CONTRAST TECHNIQUE: Multidetector CT imaging of the head and cervical spine was performed following the standard protocol without intravenous contrast. Multiplanar CT image reconstructions of the cervical spine were also generated. RADIATION DOSE REDUCTION: This exam was performed according to the departmental dose-optimization program which includes automated exposure control, adjustment of the mA and/or kV according to patient size and/or use of iterative reconstruction technique. COMPARISON:  CT head and C-spine 12/16/2021 FINDINGS: CT HEAD FINDINGS BRAIN: BRAIN Cerebral ventricle sizes are concordant with the degree of cerebral volume loss. Patchy and confluent areas of decreased attenuation are noted throughout the deep and periventricular white matter of the cerebral hemispheres bilaterally, compatible with chronic microvascular ischemic disease. No evidence of large-territorial acute infarction. No parenchymal hemorrhage. No mass  lesion. No extra-axial collection. No mass effect or midline shift. No hydrocephalus. Basilar cisterns are patent. Vascular: No hyperdense vessel. Skull: No acute fracture or focal lesion. Sinuses/Orbits: Mucosal thickening of the maxillary sinuses. Paranasal sinuses and mastoid air cells are clear. Bilateral lens replacement. Otherwise the orbits are unremarkable. Other: None. CT CERVICAL SPINE FINDINGS Alignment: Grade 1 anterolisthesis of C2 on C3, C3 on C4, C4 on C5. Mild retrolisthesis of C5 on C6. Skull base and vertebrae: Multilevel  moderate severe degenerative changes spine most prominent at the C5-C6 level. Posterior longitudinal ligament calcification (2:45). Associated multilevel severe osseous neural foraminal stenosis. No severe osseous central canal stenosis. No acute fracture. No aggressive appearing focal osseous lesion or focal pathologic process. Soft tissues and spinal canal: No prevertebral fluid or swelling. No visible canal hematoma. Upper chest: Unremarkable. Other: Atherosclerotic plaque. Subcentimeter left thyroid gland hypodense nodule. Not clinically significant; no follow-up imaging recommended (ref: J Am Coll Radiol. 2015 Feb;12(2): 143-50). IMPRESSION: 1. No acute intracranial abnormality. 2. No acute displaced fracture or traumatic listhesis of the cervical spine. 3.  Aortic Atherosclerosis (ICD10-I70.0). Electronically Signed   By: Iven Finn M.D.   On: 02/07/2022 00:01     PROCEDURES:  Critical Care performed: Yes, see critical care procedure note(s)   CRITICAL CARE Performed by: Cyril Mourning Sharissa Brierley   Total critical care time: 45 minutes  Critical care time was exclusive of separately billable procedures and treating other patients.  Critical care was necessary to treat or prevent imminent or life-threatening deterioration.  Critical care was time spent personally by me on the following activities: development of treatment plan with patient and/or surrogate as well as  nursing, discussions with consultants, evaluation of patient's response to treatment, examination of patient, obtaining history from patient or surrogate, ordering and performing treatments and interventions, ordering and review of laboratory studies, ordering and review of radiographic studies, pulse oximetry and re-evaluation of patient's condition.   Marland Kitchen1-3 Lead EKG Interpretation  Performed by: Braylee Bosher, Delice Bison, DO Authorized by: Ailis Rigaud, Delice Bison, DO     Interpretation: normal     ECG rate:  73   ECG rate assessment: normal     Rhythm: sinus rhythm     Ectopy: none     Conduction: normal   .Sedation  Date/Time: 02/07/2022 3:52 AM  Performed by: Yannis Broce, Delice Bison, DO Authorized by: Jamee Keach, Delice Bison, DO   Consent:    Consent obtained:  Written   Consent given by:  Patient (family)   Risks discussed:  Allergic reaction, dysrhythmia, inadequate sedation, nausea, vomiting, respiratory compromise necessitating ventilatory assistance and intubation, prolonged sedation necessitating reversal and prolonged hypoxia resulting in organ damage   Alternatives discussed:  Analgesia without sedation Universal protocol:    Procedure explained and questions answered to patient or proxy's satisfaction: yes     Relevant documents present and verified: yes     Test results available: yes     Imaging studies available: yes     Required blood products, implants, devices, and special equipment available: yes     Site/side marked: yes     Immediately prior to procedure, a time out was called: yes     Patient identity confirmed:  Anonymous protocol, patient vented/unresponsive Indications:    Procedure performed:  Dislocation reduction   Procedure necessitating sedation performed by:  Different physician Pre-sedation assessment:    Time since last food or drink:  8pm   ASA classification: class 2 - patient with mild systemic disease     Mouth opening:  2 finger widths   Thyromental distance:  3 finger  widths   Mallampati score:  II - soft palate, uvula, fauces visible   Neck mobility: normal     Pre-sedation assessments completed and reviewed: airway patency, cardiovascular function, hydration status, mental status, nausea/vomiting, pain level, respiratory function and temperature     Pre-sedation assessment completed:  02/07/2022 1:00 AM Immediate pre-procedure details:    Reassessment: Patient reassessed immediately prior to procedure  Reviewed: vital signs, relevant labs/tests and NPO status     Verified: bag valve mask available, emergency equipment available, intubation equipment available, IV patency confirmed, oxygen available, reversal medications available and suction available   Procedure details (see MAR for exact dosages):    Preoxygenation:  Nasal cannula   Sedation:  Propofol   Intended level of sedation: deep   Analgesia:  Fentanyl   Intra-procedure monitoring:  Blood pressure monitoring, cardiac monitor, continuous pulse oximetry, continuous capnometry, frequent LOC assessments and frequent vital sign checks   Intra-procedure events: none     Total Provider sedation time (minutes):  25 Post-procedure details:    Post-sedation assessment completed:  02/07/2022 3:00 AM   Attendance: Constant attendance by certified staff until patient recovered     Recovery: Patient returned to pre-procedure baseline     Post-sedation assessments completed and reviewed: airway patency, cardiovascular function, hydration status, mental status, nausea/vomiting, pain level, respiratory function and temperature     Patient is stable for discharge or admission: yes     Procedure completion:  Tolerated well, no immediate complications Reduction of dislocation  Date/Time: 02/07/2022 3:54 AM  Performed by: Hinda Kehr, MD Authorized by: Arpita Fentress, Delice Bison, DO  Consent: Written consent obtained. Consent given by: patient (family) Patient understanding: patient states understanding of the  procedure being performed Patient consent: the patient's understanding of the procedure matches consent given Procedure consent: procedure consent matches procedure scheduled Relevant documents: relevant documents present and verified Test results: test results available and properly labeled Site marked: the operative site was marked Imaging studies: imaging studies available Required items: required blood products, implants, devices, and special equipment available Patient identity confirmed: verbally with patient Time out: Immediately prior to procedure a "time out" was called to verify the correct patient, procedure, equipment, support staff and site/side marked as required. Local anesthesia used: no  Anesthesia: Local anesthesia used: no  Sedation: Patient sedated: yes Sedation type: moderate (conscious) sedation Sedatives: fentanyl and propofol Sedation start date/time: 02/07/2022 1:35 AM Sedation end date/time: 02/07/2022 2:03 AM Vitals: Vital signs were monitored during sedation.  Patient tolerance: patient tolerated the procedure well with no immediate complications       IMPRESSION / MDM / ASSESSMENT AND PLAN / ED COURSE  I reviewed the triage vital signs and the nursing notes.  Patient here after what sounds like a mechanical fall from home.  Has obvious signs of a right hip fracture.  The patient is on the cardiac monitor to evaluate for evidence of arrhythmia and/or significant heart rate changes.   DIFFERENTIAL DIAGNOSIS (includes but not limited to):   Hip fracture, skin tear to the right elbow, possible humerus fracture, mechanical fall  Patient's presentation is most consistent with acute presentation with potential threat to life or bodily function.  PLAN: Will obtain x-rays of the right humerus, forearm, hip, CTs of the head and cervical spine, CBC, BMP, EKG, preoperative chest x-ray, type and screen, INR.  Will keep NPO.  Will continue pain and nausea  medicine.  Will start IV fluids.   MEDICATIONS GIVEN IN ED: Medications  bacitracin ointment (has no administration in time range)  labetalol (NORMODYNE) injection 10 mg (has no administration in time range)  ondansetron (ZOFRAN) injection 4 mg (has no administration in time range)  0.9 %  sodium chloride infusion (has no administration in time range)  verapamil (CALAN-SR) CR tablet 240 mg (has no administration in time range)  pantoprazole (PROTONIX) EC tablet 40 mg (has no administration in  time range)  potassium chloride 10 mEq in 100 mL IVPB (10 mEq Intravenous New Bag/Given 02/07/22 0302)  oxyCODONE (Oxy IR/ROXICODONE) immediate release tablet 2.5 mg (has no administration in time range)  senna-docusate (Senokot-S) tablet 1 tablet (has no administration in time range)  fentaNYL (SUBLIMAZE) injection 12.5-25 mcg (has no administration in time range)  fentaNYL (SUBLIMAZE) injection 50 mcg (50 mcg Intravenous Given 02/07/22 0022)  ondansetron (ZOFRAN) injection 4 mg (4 mg Intravenous Given 02/07/22 0022)  propofol (DIPRIVAN) 10 mg/mL bolus/IV push 28.1 mg (60 mg Intravenous Given 02/07/22 0144)  fentaNYL (SUBLIMAZE) injection 50 mcg (50 mcg Intravenous Given 02/07/22 0122)     ED COURSE: Patient CT head, cervical spine reviewed and interpreted by myself and the radiologist and show no acute traumatic injury.  X-rays also reviewed and interpreted by myself and the radiologist.  She has a right anterior shoulder dislocation with a greater tuberosity fracture.  Will proceed with procedural sedation and reduction after consent from patient and family.  Patient also has a right intertrochanteric hip fracture.  Will discuss with orthopedics on-call.    Reduction performed at bedside by Dr. Karma Greaser while I perform sedation with propofol.  Repeat x-ray shows reduction was successful.  Patient recovered well with no complications.  She is in a sling/shoulder immobilizer.  Will admit to  medicine.  CONSULTS: Discussed with Dr. Posey Pronto on-call for orthopedics.  Appreciate his help.  He states he will try to take the patient to the operating room later today.  Will keep her n.p.o.  He would like for [place] to obtain CT of the right shoulder after reduction has been performed.   Consulted and discussed patient's case with hospitalist, Dr. Myna Hidalgo.  I have recommended admission and consulting physician agrees and will place admission orders.  Patient (and family if present) agree with this plan.  Appreciate hospitalist assistance with this patient.  I reviewed all nursing notes, vitals, pertinent previous records.  All labs, EKGs, imaging ordered have been independently reviewed and interpreted by myself.    OUTSIDE RECORDS REVIEWED: Reviewed patient's last admission to the hospital in October for hyponatremia.       FINAL CLINICAL IMPRESSION(S) / ED DIAGNOSES   Final diagnoses:  Fall, initial encounter  Anterior shoulder dislocation, right, initial encounter  Closed comminuted intertrochanteric fracture of right femur, initial encounter ([place])     Rx / DC Orders   ED Discharge Orders     None        Note:  This document was prepared using Dragon voice recognition software and may include unintentional dictation errors.   Evalie Hargraves, Delice Bison, DO 02/07/22 [phone]

## 2022-02-06 NOTE — ED Triage Notes (Signed)
Pt to ED via [facility] from home with family. Per EMS pt has been staying with family for "rehab" after previous fall. Per EMS pt was walking and fell face first. Per EMS pt with +shortening and rotation of R leg and possible deformity to R humeral area. Per EMS pt H.O.H. and has hearing aids in.    200/102 (no night meds given) 102 HR 96% RA 38mg fentanyl IV given en route

## 2022-02-07 ENCOUNTER — Inpatient Hospital Stay: Payer: Medicare HMO

## 2022-02-07 ENCOUNTER — Inpatient Hospital Stay: Payer: Medicare HMO | Admitting: Anesthesiology

## 2022-02-07 ENCOUNTER — Encounter
Admission: EM | Disposition: A | Discharge status: Skilled Nursing Facility | Payer: Self-pay | Source: Home / Self Care | Attending: Internal Medicine

## 2022-02-07 ENCOUNTER — Other Ambulatory Visit: Payer: Self-pay

## 2022-02-07 ENCOUNTER — Encounter: Payer: Self-pay | Admitting: Family Medicine

## 2022-02-07 DIAGNOSIS — Z7982 Long term (current) use of aspirin: Secondary | ICD-10-CM | POA: Diagnosis not present

## 2022-02-07 DIAGNOSIS — D62 Acute posthemorrhagic anemia: Secondary | ICD-10-CM | POA: Diagnosis not present

## 2022-02-07 DIAGNOSIS — Z888 Allergy status to other drugs, medicaments and biological substances status: Secondary | ICD-10-CM | POA: Diagnosis not present

## 2022-02-07 DIAGNOSIS — I160 Hypertensive urgency: Secondary | ICD-10-CM | POA: Diagnosis not present

## 2022-02-07 DIAGNOSIS — F419 Anxiety disorder, unspecified: Secondary | ICD-10-CM | POA: Diagnosis present

## 2022-02-07 DIAGNOSIS — L4050 Arthropathic psoriasis, unspecified: Secondary | ICD-10-CM | POA: Diagnosis present

## 2022-02-07 DIAGNOSIS — I340 Nonrheumatic mitral (valve) insufficiency: Secondary | ICD-10-CM | POA: Diagnosis present

## 2022-02-07 DIAGNOSIS — Z90710 Acquired absence of both cervix and uterus: Secondary | ICD-10-CM | POA: Diagnosis not present

## 2022-02-07 DIAGNOSIS — S72001A Fracture of unspecified part of neck of right femur, initial encounter for closed fracture: Secondary | ICD-10-CM | POA: Diagnosis not present

## 2022-02-07 DIAGNOSIS — I10 Essential (primary) hypertension: Secondary | ICD-10-CM | POA: Diagnosis present

## 2022-02-07 DIAGNOSIS — Z881 Allergy status to other antibiotic agents status: Secondary | ICD-10-CM | POA: Diagnosis not present

## 2022-02-07 DIAGNOSIS — H9190 Unspecified hearing loss, unspecified ear: Secondary | ICD-10-CM | POA: Diagnosis present

## 2022-02-07 DIAGNOSIS — Z66 Do not resuscitate: Secondary | ICD-10-CM | POA: Diagnosis present

## 2022-02-07 DIAGNOSIS — S72141A Displaced intertrochanteric fracture of right femur, initial encounter for closed fracture: Secondary | ICD-10-CM | POA: Diagnosis present

## 2022-02-07 DIAGNOSIS — Z79899 Other long term (current) drug therapy: Secondary | ICD-10-CM | POA: Diagnosis not present

## 2022-02-07 DIAGNOSIS — M069 Rheumatoid arthritis, unspecified: Secondary | ICD-10-CM | POA: Diagnosis present

## 2022-02-07 DIAGNOSIS — Z85828 Personal history of other malignant neoplasm of skin: Secondary | ICD-10-CM | POA: Diagnosis not present

## 2022-02-07 DIAGNOSIS — S4291XA Fracture of right shoulder girdle, part unspecified, initial encounter for closed fracture: Secondary | ICD-10-CM | POA: Diagnosis not present

## 2022-02-07 DIAGNOSIS — Z86010 Personal history of colonic polyps: Secondary | ICD-10-CM | POA: Diagnosis not present

## 2022-02-07 DIAGNOSIS — S42251A Displaced fracture of greater tuberosity of right humerus, initial encounter for closed fracture: Secondary | ICD-10-CM | POA: Diagnosis present

## 2022-02-07 DIAGNOSIS — S43014A Anterior dislocation of right humerus, initial encounter: Secondary | ICD-10-CM | POA: Diagnosis present

## 2022-02-07 DIAGNOSIS — W010XXA Fall on same level from slipping, tripping and stumbling without subsequent striking against object, initial encounter: Secondary | ICD-10-CM | POA: Diagnosis present

## 2022-02-07 HISTORY — PX: INTRAMEDULLARY (IM) NAIL INTERTROCHANTERIC: SHX5875

## 2022-02-07 LAB — URINALYSIS, ROUTINE W REFLEX MICROSCOPIC
Bacteria, UA: NONE SEEN
Bilirubin Urine: NEGATIVE
Glucose, UA: NEGATIVE mg/dL
Hgb urine dipstick: NEGATIVE
Ketones, ur: 20 mg/dL — AB
Nitrite: NEGATIVE
Protein, ur: NEGATIVE mg/dL
Specific Gravity, Urine: 1.015 (ref 1.005–1.030)
Squamous Epithelial / HPF: NONE SEEN (ref 0–5)
pH: 5 (ref 5.0–8.0)

## 2022-02-07 LAB — CBC
HCT: 33.9 % — ABNORMAL LOW (ref 36.0–46.0)
Hemoglobin: 11.3 g/dL — ABNORMAL LOW (ref 12.0–15.0)
MCH: 33.2 pg (ref 26.0–34.0)
MCHC: 33.3 g/dL (ref 30.0–36.0)
MCV: 99.7 fL (ref 80.0–100.0)
Platelets: 212 10*3/uL (ref 150–400)
RBC: 3.4 MIL/uL — ABNORMAL LOW (ref 3.87–5.11)
RDW: 13.7 % (ref 11.5–15.5)
WBC: 21.2 10*3/uL — ABNORMAL HIGH (ref 4.0–10.5)
nRBC: 0 % (ref 0.0–0.2)

## 2022-02-07 LAB — BASIC METABOLIC PANEL
Anion gap: 8 (ref 5–15)
BUN: 18 mg/dL (ref 8–23)
CO2: 23 mmol/L (ref 22–32)
Calcium: 8.8 mg/dL — ABNORMAL LOW (ref 8.9–10.3)
Chloride: 105 mmol/L (ref 98–111)
Creatinine, Ser: 0.63 mg/dL (ref 0.44–1.00)
GFR, Estimated: >60 mL/min (ref >60)
Glucose, Bld: 152 mg/dL — ABNORMAL HIGH (ref 70–99)
Potassium: 4.6 mmol/L (ref 3.5–5.1)
Sodium: 136 mmol/L (ref 135–145)

## 2022-02-07 LAB — IRON AND TIBC
Iron: 56 ug/dL (ref 28–170)
Saturation Ratios: 22 % (ref 10.4–31.8)
TIBC: 256 ug/dL (ref 250–450)
UIBC: 200 ug/dL

## 2022-02-07 LAB — PHOSPHORUS: Phosphorus: 3.1 mg/dL (ref 2.5–4.6)

## 2022-02-07 LAB — FOLATE: Folate: 28 ng/mL (ref >5.9)

## 2022-02-07 LAB — MAGNESIUM: Magnesium: 1.7 mg/dL (ref 1.7–2.4)

## 2022-02-07 LAB — VITAMIN B12: Vitamin B-12: 559 pg/mL (ref 180–914)

## 2022-02-07 SURGERY — FIXATION, FRACTURE, INTERTROCHANTERIC, WITH INTRAMEDULLARY ROD
Anesthesia: General | Laterality: Right

## 2022-02-07 MED ORDER — BUPIVACAINE HCL (PF) 0.5 % IJ SOLN
INTRAMUSCULAR | Status: DC | PRN
  Administered 2022-02-07: 30 mL

## 2022-02-07 MED ORDER — FENTANYL CITRATE (PF) 100 MCG/2ML IJ SOLN
INTRAMUSCULAR | Status: AC
  Filled 2022-02-07: qty 2

## 2022-02-07 MED ORDER — OXYCODONE HCL 5 MG PO TABS
5.0000 mg | ORAL_TABLET | ORAL | Status: DC | PRN
  Administered 2022-02-08 – 2022-02-09 (×2): 5 mg via ORAL
  Filled 2022-02-07: qty 1

## 2022-02-07 MED ORDER — SEVOFLURANE IN SOLN
RESPIRATORY_TRACT | Status: AC
  Filled 2022-02-07: qty 500

## 2022-02-07 MED ORDER — FLEET ENEMA 7-19 GM/118ML RE ENEM: 1.0000 | ENEMA | Freq: Once | RECTAL | Status: DC | PRN

## 2022-02-07 MED ORDER — ONDANSETRON HCL 4 MG/2ML IJ SOLN
INTRAMUSCULAR | Status: DC | PRN
  Administered 2022-02-07: 4 mg via INTRAVENOUS

## 2022-02-07 MED ORDER — METHOCARBAMOL 500 MG PO TABS: 500.0000 mg | ORAL_TABLET | Freq: Four times a day (QID) | ORAL | Status: DC | PRN

## 2022-02-07 MED ORDER — KETOROLAC TROMETHAMINE 15 MG/ML IJ SOLN
7.5000 mg | Freq: Four times a day (QID) | INTRAMUSCULAR | Status: AC
  Administered 2022-02-07 – 2022-02-08 (×4): 7.5 mg via INTRAVENOUS
  Filled 2022-02-07 (×4): qty 1

## 2022-02-07 MED ORDER — ONDANSETRON HCL 4 MG/2ML IJ SOLN: 4.0000 mg | Freq: Four times a day (QID) | INTRAMUSCULAR | Status: DC | PRN

## 2022-02-07 MED ORDER — SENNOSIDES-DOCUSATE SODIUM 8.6-50 MG PO TABS: 1.0000 | ORAL_TABLET | Freq: Every evening | ORAL | Status: DC | PRN

## 2022-02-07 MED ORDER — ADULT MULTIVITAMIN W/MINERALS CH
1.0000 | ORAL_TABLET | Freq: Every day | ORAL | Status: DC
  Administered 2022-02-08 – 2022-02-11 (×4): 1 via ORAL
  Filled 2022-02-07 (×4): qty 1

## 2022-02-07 MED ORDER — ACETAMINOPHEN 10 MG/ML IV SOLN
INTRAVENOUS | Status: AC
  Administered 2022-02-07: 1000 mg
  Filled 2022-02-07: qty 100

## 2022-02-07 MED ORDER — DEXAMETHASONE SODIUM PHOSPHATE 10 MG/ML IJ SOLN
INTRAMUSCULAR | Status: DC | PRN
  Administered 2022-02-07: 4 mg via INTRAVENOUS

## 2022-02-07 MED ORDER — CEFAZOLIN SODIUM-DEXTROSE 1-4 GM/50ML-% IV SOLN
1.0000 g | Freq: Four times a day (QID) | INTRAVENOUS | Status: AC
  Administered 2022-02-07 – 2022-02-08 (×3): 1 g via INTRAVENOUS
  Filled 2022-02-07 (×3): qty 50

## 2022-02-07 MED ORDER — SODIUM CHLORIDE 0.9 % IV SOLN: INTRAVENOUS | Status: DC

## 2022-02-07 MED ORDER — FOLIC ACID 1 MG PO TABS
2.0000 mg | ORAL_TABLET | Freq: Every day | ORAL | Status: DC
  Administered 2022-02-08 – 2022-02-11 (×4): 2 mg via ORAL
  Filled 2022-02-07 (×4): qty 2

## 2022-02-07 MED ORDER — FENTANYL CITRATE PF 50 MCG/ML IJ SOSY
50.0000 ug | PREFILLED_SYRINGE | Freq: Once | INTRAMUSCULAR | Status: AC
  Administered 2022-02-07: 50 ug via INTRAVENOUS
  Filled 2022-02-07: qty 1

## 2022-02-07 MED ORDER — ACETAMINOPHEN 10 MG/ML IV SOLN: 1000.0000 mg | Freq: Once | INTRAVENOUS | Status: AC

## 2022-02-07 MED ORDER — GLYCOPYRROLATE 0.2 MG/ML IJ SOLN
INTRAMUSCULAR | Status: DC | PRN
  Administered 2022-02-07: .1 mg via INTRAVENOUS

## 2022-02-07 MED ORDER — FENTANYL CITRATE (PF) 100 MCG/2ML IJ SOLN
INTRAMUSCULAR | Status: DC | PRN
  Administered 2022-02-07 (×2): 50 ug via INTRAVENOUS

## 2022-02-07 MED ORDER — PROPOFOL 10 MG/ML IV BOLUS
INTRAVENOUS | Status: DC | PRN
  Administered 2022-02-07 (×2): 50 mg via INTRAVENOUS

## 2022-02-07 MED ORDER — METOCLOPRAMIDE HCL 5 MG/ML IJ SOLN: 5.0000 mg | Freq: Three times a day (TID) | INTRAMUSCULAR | Status: DC | PRN

## 2022-02-07 MED ORDER — ACETAMINOPHEN 325 MG PO TABS
650.0000 mg | ORAL_TABLET | Freq: Four times a day (QID) | ORAL | Status: DC | PRN
  Administered 2022-02-07: 650 mg via ORAL
  Filled 2022-02-07: qty 2

## 2022-02-07 MED ORDER — METHOCARBAMOL 1000 MG/10ML IJ SOLN: 500.0000 mg | Freq: Four times a day (QID) | INTRAVENOUS | Status: DC | PRN

## 2022-02-07 MED ORDER — 0.9 % SODIUM CHLORIDE (POUR BTL) OPTIME
TOPICAL | Status: DC | PRN
  Administered 2022-02-07: 1000 mL

## 2022-02-07 MED ORDER — OXYCODONE HCL 5 MG PO TABS: 2.5000 mg | ORAL_TABLET | ORAL | Status: DC | PRN

## 2022-02-07 MED ORDER — ONDANSETRON HCL 4 MG PO TABS: 4.0000 mg | ORAL_TABLET | Freq: Four times a day (QID) | ORAL | Status: DC | PRN

## 2022-02-07 MED ORDER — LIDOCAINE HCL (PF) 2 % IJ SOLN
INTRAMUSCULAR | Status: AC
  Filled 2022-02-07: qty 5

## 2022-02-07 MED ORDER — ENSURE ENLIVE PO LIQD
237.0000 mL | Freq: Two times a day (BID) | ORAL | Status: DC
  Administered 2022-02-08 – 2022-02-10 (×5): 237 mL via ORAL
  Filled 2022-02-07 (×2): qty 237

## 2022-02-07 MED ORDER — TRANEXAMIC ACID-NACL 1000-0.7 MG/100ML-% IV SOLN
INTRAVENOUS | Status: AC
  Administered 2022-02-07: 1000 mg
  Filled 2022-02-07: qty 100

## 2022-02-07 MED ORDER — MENTHOL 3 MG MT LOZG
1.0000 | LOZENGE | OROMUCOSAL | Status: DC | PRN
  Administered 2022-02-07: 3 mg via ORAL
  Filled 2022-02-07: qty 9

## 2022-02-07 MED ORDER — KETOROLAC TROMETHAMINE 15 MG/ML IJ SOLN: 7.5000 mg | Freq: Four times a day (QID) | INTRAMUSCULAR | Status: DC | PRN

## 2022-02-07 MED ORDER — PROPOFOL 10 MG/ML IV BOLUS
INTRAVENOUS | Status: AC
  Filled 2022-02-07: qty 20

## 2022-02-07 MED ORDER — FENTANYL CITRATE (PF) 100 MCG/2ML IJ SOLN
25.0000 ug | INTRAMUSCULAR | Status: DC | PRN
  Administered 2022-02-07: 25 ug via INTRAVENOUS

## 2022-02-07 MED ORDER — ONDANSETRON HCL 4 MG/2ML IJ SOLN: 4.0000 mg | Freq: Once | INTRAMUSCULAR | Status: DC | PRN

## 2022-02-07 MED ORDER — METHOTREXATE SODIUM 2.5 MG PO TABS
10.0000 mg | ORAL_TABLET | ORAL | Status: DC
  Administered 2022-02-08: 10 mg via ORAL
  Filled 2022-02-07: qty 4

## 2022-02-07 MED ORDER — POTASSIUM CHLORIDE CRYS ER 20 MEQ PO TBCR
20.0000 meq | EXTENDED_RELEASE_TABLET | Freq: Every day | ORAL | Status: DC
  Administered 2022-02-08 – 2022-02-11 (×4): 20 meq via ORAL
  Filled 2022-02-07 (×4): qty 1

## 2022-02-07 MED ORDER — NEOMYCIN-POLYMYXIN B GU 40-200000 IR SOLN
Status: DC | PRN
  Administered 2022-02-07: 4 mL

## 2022-02-07 MED ORDER — HYDROMORPHONE HCL 1 MG/ML IJ SOLN: 0.2000 mg | INTRAMUSCULAR | Status: DC | PRN

## 2022-02-07 MED ORDER — VERAPAMIL HCL ER 240 MG PO TBCR
240.0000 mg | EXTENDED_RELEASE_TABLET | Freq: Every day | ORAL | Status: DC
  Administered 2022-02-08 – 2022-02-11 (×4): 240 mg via ORAL
  Filled 2022-02-07 (×5): qty 1

## 2022-02-07 MED ORDER — ENOXAPARIN SODIUM 40 MG/0.4ML IJ SOSY: 40.0000 mg | PREFILLED_SYRINGE | Freq: Every day | INTRAMUSCULAR | Status: DC

## 2022-02-07 MED ORDER — ONDANSETRON HCL 4 MG/2ML IJ SOLN
INTRAMUSCULAR | Status: AC
  Filled 2022-02-07: qty 2

## 2022-02-07 MED ORDER — ACETAMINOPHEN 325 MG PO TABS
325.0000 mg | ORAL_TABLET | Freq: Four times a day (QID) | ORAL | Status: DC | PRN
  Administered 2022-02-10: 650 mg via ORAL
  Filled 2022-02-07: qty 2

## 2022-02-07 MED ORDER — METOCLOPRAMIDE HCL 10 MG PO TABS: 5.0000 mg | ORAL_TABLET | Freq: Three times a day (TID) | ORAL | Status: DC | PRN

## 2022-02-07 MED ORDER — CEFAZOLIN SODIUM-DEXTROSE 2-3 GM-%(50ML) IV SOLR
INTRAVENOUS | Status: DC | PRN
  Administered 2022-02-07: 2 g via INTRAVENOUS

## 2022-02-07 MED ORDER — ENOXAPARIN SODIUM 40 MG/0.4ML IJ SOSY
40.0000 mg | PREFILLED_SYRINGE | INTRAMUSCULAR | Status: DC
  Administered 2022-02-08 – 2022-02-11 (×4): 40 mg via SUBCUTANEOUS
  Filled 2022-02-07 (×4): qty 0.4

## 2022-02-07 MED ORDER — LABETALOL HCL 5 MG/ML IV SOLN: 10.0000 mg | INTRAVENOUS | Status: DC | PRN

## 2022-02-07 MED ORDER — ADULT MULTIVITAMIN W/MINERALS CH: 1.0000 | ORAL_TABLET | Freq: Every day | ORAL | Status: DC

## 2022-02-07 MED ORDER — DEXAMETHASONE SODIUM PHOSPHATE 10 MG/ML IJ SOLN
INTRAMUSCULAR | Status: AC
  Filled 2022-02-07: qty 1

## 2022-02-07 MED ORDER — PANTOPRAZOLE SODIUM 40 MG PO TBEC
40.0000 mg | DELAYED_RELEASE_TABLET | Freq: Every day | ORAL | Status: DC
  Administered 2022-02-07 – 2022-02-11 (×5): 40 mg via ORAL
  Filled 2022-02-07 (×5): qty 1

## 2022-02-07 MED ORDER — FENTANYL CITRATE PF 50 MCG/ML IJ SOSY: 25.0000 ug | PREFILLED_SYRINGE | INTRAMUSCULAR | Status: DC | PRN

## 2022-02-07 MED ORDER — FENTANYL CITRATE PF 50 MCG/ML IJ SOSY
12.5000 ug | PREFILLED_SYRINGE | INTRAMUSCULAR | Status: DC | PRN
  Administered 2022-02-07: 25 ug via INTRAVENOUS
  Filled 2022-02-07: qty 1

## 2022-02-07 MED ORDER — DOCUSATE SODIUM 100 MG PO CAPS
100.0000 mg | ORAL_CAPSULE | Freq: Two times a day (BID) | ORAL | Status: DC
  Administered 2022-02-07 – 2022-02-11 (×9): 100 mg via ORAL
  Filled 2022-02-07 (×9): qty 1

## 2022-02-07 MED ORDER — GLYCOPYRROLATE 0.2 MG/ML IJ SOLN
INTRAMUSCULAR | Status: AC
  Filled 2022-02-07: qty 1

## 2022-02-07 MED ORDER — POTASSIUM CHLORIDE 10 MEQ/100ML IV SOLN
10.0000 meq | INTRAVENOUS | Status: AC
  Administered 2022-02-07 (×2): 10 meq via INTRAVENOUS
  Filled 2022-02-07 (×2): qty 100

## 2022-02-07 MED ORDER — LISINOPRIL 20 MG PO TABS
40.0000 mg | ORAL_TABLET | Freq: Every day | ORAL | Status: DC
  Administered 2022-02-08 – 2022-02-11 (×4): 40 mg via ORAL
  Filled 2022-02-07 (×4): qty 2

## 2022-02-07 MED ORDER — ESMOLOL HCL 100 MG/10ML IV SOLN
INTRAVENOUS | Status: AC
  Filled 2022-02-07: qty 10

## 2022-02-07 MED ORDER — BISACODYL 10 MG RE SUPP
10.0000 mg | Freq: Every day | RECTAL | Status: DC | PRN
  Administered 2022-02-09: 10 mg via RECTAL
  Filled 2022-02-07: qty 1

## 2022-02-07 MED ORDER — OXYCODONE HCL 5 MG PO TABS
2.5000 mg | ORAL_TABLET | ORAL | Status: DC | PRN
  Administered 2022-02-08: 5 mg via ORAL
  Filled 2022-02-07 (×3): qty 1

## 2022-02-07 MED ORDER — PROPOFOL 10 MG/ML IV BOLUS
0.5000 mg/kg | Freq: Once | INTRAVENOUS | Status: AC
  Administered 2022-02-07: 60 mg via INTRAVENOUS
  Filled 2022-02-07: qty 20

## 2022-02-07 MED ORDER — PHENYLEPHRINE HCL (PRESSORS) 10 MG/ML IV SOLN
INTRAVENOUS | Status: DC | PRN
  Administered 2022-02-07 (×4): 80 ug via INTRAVENOUS

## 2022-02-07 MED ORDER — TRAMADOL HCL 50 MG PO TABS: 50.0000 mg | ORAL_TABLET | Freq: Four times a day (QID) | ORAL | Status: DC | PRN

## 2022-02-07 MED ORDER — ACETAMINOPHEN 500 MG PO TABS
1000.0000 mg | ORAL_TABLET | Freq: Three times a day (TID) | ORAL | Status: DC
  Administered 2022-02-08 – 2022-02-11 (×10): 1000 mg via ORAL
  Filled 2022-02-07 (×10): qty 2

## 2022-02-07 MED ORDER — LIDOCAINE HCL (CARDIAC) PF 100 MG/5ML IV SOSY
PREFILLED_SYRINGE | INTRAVENOUS | Status: DC | PRN
  Administered 2022-02-07: 60 mg via INTRAVENOUS

## 2022-02-07 MED ORDER — KETOROLAC TROMETHAMINE 15 MG/ML IJ SOLN
INTRAMUSCULAR | Status: AC
  Administered 2022-02-07: 7.5 mg via INTRAVENOUS
  Filled 2022-02-07: qty 1

## 2022-02-07 MED ORDER — BUPIVACAINE LIPOSOME 1.3 % IJ SUSP
INTRAMUSCULAR | Status: DC | PRN
  Administered 2022-02-07: 20 mL

## 2022-02-07 SURGICAL SUPPLY — 44 items
BIT DRILL INTERTAN LAG SCREW (BIT) IMPLANT
BIT DRILL LONG 4.0 (BIT) IMPLANT
BLADE SURG 15 STRL LF DISP TIS (BLADE) ×1 IMPLANT
BLADE SURG 15 STRL SS (BLADE) ×1
CHLORAPREP W/TINT 26 (MISCELLANEOUS) ×1 IMPLANT
DRAPE 3/4 80X56 (DRAPES) ×1 IMPLANT
DRAPE SURG 17X11 SM STRL (DRAPES) ×2 IMPLANT
DRAPE U-SHAPE 47X51 STRL (DRAPES) ×2 IMPLANT
DRILL BIT LONG 4.0 (BIT) ×1
DRSG OPSITE POSTOP 3X4 (GAUZE/BANDAGES/DRESSINGS) ×3 IMPLANT
DRSG OPSITE POSTOP 4X6 (GAUZE/BANDAGES/DRESSINGS) IMPLANT
ELECT REM PT RETURN 9FT ADLT (ELECTROSURGICAL) ×1
ELECTRODE REM PT RTRN 9FT ADLT (ELECTROSURGICAL) ×1 IMPLANT
GLOVE BIOGEL PI IND STRL 8 (GLOVE) ×1 IMPLANT
GLOVE SURG SYN 7.5  E (GLOVE) ×1
GLOVE SURG SYN 7.5 E (GLOVE) ×1 IMPLANT
GLOVE SURG SYN 7.5 PF PI (GLOVE) ×1 IMPLANT
GOWN STRL REUS W/ TWL LRG LVL3 (GOWN DISPOSABLE) ×1 IMPLANT
GOWN STRL REUS W/ TWL XL LVL3 (GOWN DISPOSABLE) ×1 IMPLANT
GOWN STRL REUS W/TWL LRG LVL3 (GOWN DISPOSABLE) ×1
GOWN STRL REUS W/TWL XL LVL3 (GOWN DISPOSABLE) ×1
GUIDE PIN 3.2X343 (PIN) ×2
GUIDE PIN 3.2X343MM (PIN) ×2
KIT PATIENT CARE HANA TABLE (KITS) ×1 IMPLANT
KIT TURNOVER KIT A (KITS) ×1 IMPLANT
MANIFOLD NEPTUNE II (INSTRUMENTS) ×1 IMPLANT
MAT ABSORB  FLUID 56X50 GRAY (MISCELLANEOUS) ×2
MAT ABSORB FLUID 56X50 GRAY (MISCELLANEOUS) ×2 IMPLANT
NAIL TRIGEN INTERTAN 10X18CM (Nail) IMPLANT
NDL FILTER BLUNT 18X1 1/2 (NEEDLE) ×1 IMPLANT
NEEDLE FILTER BLUNT 18X1 1/2 (NEEDLE) ×1 IMPLANT
NEEDLE HYPO 22GX1.5 SAFETY (NEEDLE) ×1 IMPLANT
NS IRRIG 1000ML POUR BTL (IV SOLUTION) ×1 IMPLANT
PACK HIP COMPR (MISCELLANEOUS) ×1 IMPLANT
PIN GUIDE 3.2X343MM (PIN) IMPLANT
SCREW LAG COMPR KIT 95/90 (Screw) IMPLANT
SCREW TRIGEN LOW PROF 5.0X30 (Screw) IMPLANT
STAPLER SKIN PROX 35W (STAPLE) ×1 IMPLANT
SUT VIC AB 2-0 CT2 27 (SUTURE) ×1 IMPLANT
SYR 10ML LL (SYRINGE) ×1 IMPLANT
SYR 30ML LL (SYRINGE) ×1 IMPLANT
TAPE CLOTH 3X10 WHT NS LF (GAUZE/BANDAGES/DRESSINGS) ×2 IMPLANT
TRAP FLUID SMOKE EVACUATOR (MISCELLANEOUS) ×1 IMPLANT
WATER STERILE IRR 500ML POUR (IV SOLUTION) ×1 IMPLANT

## 2022-02-07 NOTE — Anesthesia Procedure Notes (Signed)
Procedure Name: LMA Insertion Date/Time: 02/07/2022 11:42 AM  Performed by: Orion Crook, CRNAPre-anesthesia Checklist: Patient identified, Suction available, Patient being monitored, Emergency Drugs available and Timeout performed Patient Re-evaluated:Patient Re-evaluated prior to induction Oxygen Delivery Method: Circle system utilized Preoxygenation: Pre-oxygenation with 100% oxygen Induction Type: IV induction Ventilation: Mask ventilation without difficulty LMA: LMA inserted LMA Size: 4.0 Number of attempts: 1 Placement Confirmation: positive ETCO2, CO2 detector and breath sounds checked- equal and bilateral Dental Injury: Teeth and Oropharynx as per pre-operative assessment

## 2022-02-07 NOTE — Progress Notes (Signed)
[facility] Progress Note  Patient: Lisa Koch    OFB:[id number]  DOA: 02/06/2022     Date of Service: Lisa patient was seen and examined on 02/07/2022  Chief Complaint  Patient presents with   Fall   Brief hospital course: Lisa Koch is a 88 y.o. female with medical history significant for hypertension, GERD, rheumatoid arthritis, and moderate tricuspid regurgitation who presents to Lisa emergency department with right hip pain and deformity after a fall.  Patient was found to have right hip fracture.  Orthopedics was consulted and further management as below.   ED Course: Upon arrival to Lisa ED, patient is found to be afebrile and saturating mid 90s on room air with severely elevated blood pressure.  EKG demonstrates sinus rhythm with PAC.  Chest x-ray negative for acute cardiopulmonary disease.  Head CT and cervical spine CT are negative for acute findings.  Plain radiographs of Lisa hip demonstrate acute right intertrochanteric femur fracture.  Radiographs of Lisa right shoulder are concerning for anterior dislocation and fracture of Lisa greater tuberosity of Lisa right femoral head.   Orthopedic surgery was consulted by Lisa ED physician, type and screen was performed, and Lisa patient was treated with fentanyl and Zofran.   Assessment and Plan: # Right hip fracture  - Appreciate orthopedic surgery consultation  - Based on Lisa available data, Lisa Koch presents an estimated 1.6% risk of perioperative MI or cardiac arrest; no further cardiac evaluation indicated prior to surgery  - Keep NPO, hold ASA and lisinopril,  continue pain-control,  gentle IVF hydration      #  Anterior dislocation right shoulder; fracture of greater tuberosity of right humerus  - s/p closed reduction done in ED with follow-up imaging and orthopedic surgery consultation    # Hypertensive urgency  - BP severely elevated in ED in setting of pain and anxiety regarding her current situation  - No  evidence for end-organ injury  - Continue pain-control,  continue verapamil,  hold lisinopril perioperatively, and use labetalol if needed    Body mass index is 24.22 kg/m.  Interventions:     Diet: NPO for surgical intervention DVT Prophylaxis: Subcutaneous Lovenox   Advance goals of care discussion: DNR  Family Communication: family was present at bedside, at Lisa time of interview.  Lisa Koch provided permission to discuss medical plan with Lisa family. Opportunity was given to ask question and all questions were answered satisfactorily.   Disposition:  Koch is from Home, admitted with fall and right hip fracture, need ORIF and Koch/OT eval., which precludes a safe discharge. Discharge to SNF, when stable and cleared by orthopedic surgery.  Subjective: No significant overnight events.  Patient's pain is under control at rest, it hurts more when she moves around.  Patient denies any chest pain or palpitation, no shortness of breath.  No any other active issues.  Physical Exam: General:  alert, NAD.  Appear in mild distress, affect appropriate Eyes: PERRLA ENT: Oral Mucosa Clear, moist  Neck: no JVD,  Cardiovascular: S1 and S2 Present, no Murmur,  Respiratory: good respiratory effort, Bilateral Air entry equal and Decreased, no Crackles, no wheezes Abdomen: Bowel Sound present, Soft and no tenderness,  Skin: no rashes Extremities: no Pedal edema, no calf tenderness Neurologic: without any new focal findings Gait not checked due to patient safety concerns  Vitals:   02/07/22 1000 02/07/22 1245 02/07/22 1300 02/07/22 1315  BP: (!) 148/89 (!) 160/80 (!) 141/87 (!) 177/85  Pulse: (!) 103  100 (!) 101 96  Resp: (!) 23 (!) 24 19 (!) 28  Temp:  98.6 F (37 C)    TempSrc:      SpO2: 97% 98% 92% 97%  Weight:      Height:        Intake/Output Summary (Last 24 hours) at 02/07/2022 1349 Last data filed at 02/07/2022 1235 Gross per 24 hour  Intake 1500 ml  Output 100 ml  Net 1400  ml   Filed Weights   02/06/22 2301  Weight: 56.2 kg    Data Reviewed: I have personally reviewed and interpreted daily labs, tele strips, imagings as discussed above. I reviewed all nursing notes, pharmacy notes, vitals, pertinent old records I have discussed plan of care as described above with RN and patient/family.  CBC: Recent Labs  Lab 02/06/22 2249 02/07/22 0429  WBC 12.0* 21.2*  NEUTROABS 8.1*  --   HGB 12.5 11.3*  HCT 37.9 33.9*  MCV 99.0 99.7  PLT 248 563   Basic Metabolic Panel: Recent Labs  Lab 02/06/22 2249 02/07/22 0428 02/07/22 0429  NA 137  --  136  K 3.5  --  4.6  CL 104  --  105  CO2 23  --  23  GLUCOSE 109*  --  152*  BUN 21  --  18  CREATININE 0.74  --  0.63  CALCIUM 9.5  --  8.8*  MG  --   --  1.7  PHOS  --  3.1  --     Studies: DG HIP UNILAT WITH PELVIS 2-3 VIEWS RIGHT  Result Date: 02/07/2022 CLINICAL DATA:  Surgical internal fixation of right femur fracture. EXAM: DG HIP (WITH OR WITHOUT PELVIS) 2-3V RIGHT; DG C-ARM 1-60 MIN-NO REPORT Fluoroscopy time: 13 seconds. COMPARISON:  February 06, 2022. FINDINGS: Four intraoperative fluoroscopic images were obtained of Lisa right hip. These images demonstrate surgical internal fixation of proximal right femoral intertrochanteric fracture. IMPRESSION: Fluoroscopic guidance provided during surgical internal fixation of proximal right femoral intertrochanteric fracture. Electronically Signed   By: Marijo Conception M.D.   On: 02/07/2022 12:49   DG C-Arm 1-60 Min-No Report  Result Date: 02/07/2022 Fluoroscopy was utilized by Lisa requesting physician.  No radiographic interpretation.   CT Shoulder Right Wo Contrast  Result Date: 02/07/2022 CLINICAL DATA:  Right shoulder fracture. EXAM: CT OF Lisa UPPER RIGHT EXTREMITY WITHOUT CONTRAST TECHNIQUE: Multidetector CT imaging of Lisa upper right extremity was performed according to Lisa standard protocol. RADIATION DOSE REDUCTION: This exam was performed  according to Lisa departmental dose-optimization program which includes automated exposure control, adjustment of the mA and/or kV according to patient size and/or use of iterative reconstruction technique. COMPARISON:  Radiographs, same date. FINDINGS: Comminuted and mildly displaced fractures of Lisa greater tuberosity. No humeral neck fracture. Lisa humeral head is normally located in Lisa glenoid fossa. No glenoid fracture. [facility] joint is intact. No clavicle or scapular fractures. Lisa visualized right ribs are intact. Grossly by CT Lisa rotator cuff tendons are intact. IMPRESSION: 1. Comminuted and mildly displaced fractures of Lisa greater tuberosity. 2. No humeral neck fracture. 3. Grossly by CT Lisa rotator cuff tendons are intact. Electronically Signed   By: Marijo Sanes M.D.   On: 02/07/2022 08:51   DG Shoulder Right Portable  Result Date: 02/07/2022 CLINICAL DATA:  Status post reduction EXAM: RIGHT SHOULDER - 1 VIEW COMPARISON:  Films from earlier in Lisa same day. FINDINGS: Humeral head is been reduced into Lisa glenoid. Previously seen comminuted  fractures involving Lisa greater tuberosity are again noted and less displaced. No new fracture is seen. IMPRESSION: Status post reduction. Electronically Signed   By: Inez Catalina M.D.   On: 02/07/2022 02:09   DG Chest Portable 1 View  Result Date: 02/07/2022 CLINICAL DATA:  Status post fall. EXAM: PORTABLE CHEST 1 VIEW COMPARISON:  None Available. FINDINGS: Lisa heart size and mediastinal contours are within normal limits. Low lung volumes are seen with stable moderate to marked severity elevation of Lisa right hemidiaphragm. There is no evidence of an acute infiltrate, pleural effusion or pneumothorax. Chronic sixth and seventh left rib fractures are seen. There is anterior dislocation of Lisa right shoulder with an acute fracture of Lisa greater tuberosity of Lisa right humeral head. IMPRESSION: 1. Low lung volumes without acute cardiopulmonary disease. 2.  Anterior dislocation of Lisa right shoulder with and acute fracture of Lisa greater tuberosity of Lisa right humeral head. Electronically Signed   By: Virgina Norfolk M.D.   On: 02/07/2022 00:22   DG Forearm Right  Result Date: 02/07/2022 CLINICAL DATA:  Status post fall. EXAM: RIGHT FOREARM - 2 VIEW COMPARISON:  None Available. FINDINGS: There is no evidence of an acute fracture or dislocation. Chronic and degenerative changes are seen involving Lisa lateral epicondyle of Lisa distal right humerus. Soft tissues are unremarkable. IMPRESSION: Chronic and degenerative changes without evidence of an acute osseous abnormality. Electronically Signed   By: Virgina Norfolk M.D.   On: 02/07/2022 00:20   DG Hip Unilat  With Pelvis 2-3 Views Right  Result Date: 02/07/2022 CLINICAL DATA:  Status post fall. EXAM: DG HIP (WITH OR WITHOUT PELVIS) 2-3V RIGHT COMPARISON:  None Available. FINDINGS: There is an acute, comminuted fracture deformity seen extending through Lisa inter trochanteric region of Lisa proximal right femur. There is no evidence of dislocation. Moderate severity degenerative changes are seen in Lisa form of joint space narrowing and acetabular sclerosis. IMPRESSION: Acute fracture of Lisa proximal right femur. Electronically Signed   By: Virgina Norfolk M.D.   On: 02/07/2022 00:18   DG Humerus Right  Result Date: 02/07/2022 CLINICAL DATA:  Status post fall. EXAM: RIGHT HUMERUS - 2+ VIEW COMPARISON:  None Available. FINDINGS: There is anterior dislocation of Lisa right humeral head with respect to Lisa right glenoid. An acute, displaced fracture is also seen involving Lisa greater tuberosity of Lisa right humeral head. Moderate severity soft tissue swelling is seen along Lisa lateral aspect of Lisa proximal to mid right humerus. IMPRESSION: Anterior dislocation of Lisa right shoulder with an acute fracture of Lisa greater tuberosity of Lisa right humeral head. Electronically Signed   By: Virgina Norfolk M.D.    On: 02/07/2022 00:17   CT HEAD WO CONTRAST (5MM)  Result Date: 02/07/2022 CLINICAL DATA:  Head trauma, minor (Age >= 65y); Neck trauma (Age >= 65y). Per EMS Koch has been staying with family for "rehab" after previous fall. Per EMS Koch was walking and fell face first. Per EMS Koch with +shortening and rotation of R leg and possible deformation EXAM: CT HEAD WITHOUT CONTRAST CT CERVICAL SPINE WITHOUT CONTRAST TECHNIQUE: Multidetector CT imaging of Lisa head and cervical spine was performed following Lisa standard protocol without intravenous contrast. Multiplanar CT image reconstructions of Lisa cervical spine were also generated. RADIATION DOSE REDUCTION: This exam was performed according to Lisa departmental dose-optimization program which includes automated exposure control, adjustment of the mA and/or kV according to patient size and/or use of iterative reconstruction technique. COMPARISON:  CT head  and C-spine 12/16/2021 FINDINGS: CT HEAD FINDINGS BRAIN: BRAIN Cerebral ventricle sizes are concordant with Lisa degree of cerebral volume loss. Patchy and confluent areas of decreased attenuation are noted throughout Lisa deep and periventricular white matter of Lisa cerebral hemispheres bilaterally, compatible with chronic microvascular ischemic disease. No evidence of large-territorial acute infarction. No parenchymal hemorrhage. No mass lesion. No extra-axial collection. No mass effect or midline shift. No hydrocephalus. Basilar cisterns are patent. Vascular: No hyperdense vessel. Skull: No acute fracture or focal lesion. Sinuses/Orbits: Mucosal thickening of Lisa maxillary sinuses. Paranasal sinuses and mastoid air cells are clear. Bilateral lens replacement. Otherwise Lisa orbits are unremarkable. Other: None. CT CERVICAL SPINE FINDINGS Alignment: Grade 1 anterolisthesis of C2 on C3, C3 on C4, C4 on C5. Mild retrolisthesis of C5 on C6. Skull base and vertebrae: Multilevel moderate severe degenerative changes spine most  prominent at Lisa C5-C6 level. Posterior longitudinal ligament calcification (2:45). Associated multilevel severe osseous neural foraminal stenosis. No severe osseous central canal stenosis. No acute fracture. No aggressive appearing focal osseous lesion or focal pathologic process. Soft tissues and spinal canal: No prevertebral fluid or swelling. No visible canal hematoma. Upper chest: Unremarkable. Other: Atherosclerotic plaque. Subcentimeter left thyroid gland hypodense nodule. Not clinically significant; no follow-up imaging recommended (ref: J Am Coll Radiol. 2015 Feb;12(2): 143-50). IMPRESSION: 1. No acute intracranial abnormality. 2. No acute displaced fracture or traumatic listhesis of Lisa cervical spine. 3.  Aortic Atherosclerosis (ICD10-I70.0). Electronically Signed   By: Iven Finn M.D.   On: 02/07/2022 00:01   CT Cervical Spine Wo Contrast  Result Date: 02/07/2022 CLINICAL DATA:  Head trauma, minor (Age >= 65y); Neck trauma (Age >= 65y). Per EMS Koch has been staying with family for "rehab" after previous fall. Per EMS Koch was walking and fell face first. Per EMS Koch with +shortening and rotation of R leg and possible deformation EXAM: CT HEAD WITHOUT CONTRAST CT CERVICAL SPINE WITHOUT CONTRAST TECHNIQUE: Multidetector CT imaging of Lisa head and cervical spine was performed following Lisa standard protocol without intravenous contrast. Multiplanar CT image reconstructions of Lisa cervical spine were also generated. RADIATION DOSE REDUCTION: This exam was performed according to Lisa departmental dose-optimization program which includes automated exposure control, adjustment of the mA and/or kV according to patient size and/or use of iterative reconstruction technique. COMPARISON:  CT head and C-spine 12/16/2021 FINDINGS: CT HEAD FINDINGS BRAIN: BRAIN Cerebral ventricle sizes are concordant with Lisa degree of cerebral volume loss. Patchy and confluent areas of decreased attenuation are noted throughout  Lisa deep and periventricular white matter of Lisa cerebral hemispheres bilaterally, compatible with chronic microvascular ischemic disease. No evidence of large-territorial acute infarction. No parenchymal hemorrhage. No mass lesion. No extra-axial collection. No mass effect or midline shift. No hydrocephalus. Basilar cisterns are patent. Vascular: No hyperdense vessel. Skull: No acute fracture or focal lesion. Sinuses/Orbits: Mucosal thickening of Lisa maxillary sinuses. Paranasal sinuses and mastoid air cells are clear. Bilateral lens replacement. Otherwise Lisa orbits are unremarkable. Other: None. CT CERVICAL SPINE FINDINGS Alignment: Grade 1 anterolisthesis of C2 on C3, C3 on C4, C4 on C5. Mild retrolisthesis of C5 on C6. Skull base and vertebrae: Multilevel moderate severe degenerative changes spine most prominent at Lisa C5-C6 level. Posterior longitudinal ligament calcification (2:45). Associated multilevel severe osseous neural foraminal stenosis. No severe osseous central canal stenosis. No acute fracture. No aggressive appearing focal osseous lesion or focal pathologic process. Soft tissues and spinal canal: No prevertebral fluid or swelling. No visible canal hematoma. Upper chest: Unremarkable. Other:  Atherosclerotic plaque. Subcentimeter left thyroid gland hypodense nodule. Not clinically significant; no follow-up imaging recommended (ref: J Am Coll Radiol. 2015 Feb;12(2): 143-50). IMPRESSION: 1. No acute intracranial abnormality. 2. No acute displaced fracture or traumatic listhesis of Lisa cervical spine. 3.  Aortic Atherosclerosis (ICD10-I70.0). Electronically Signed   By: Iven Finn M.D.   On: 02/07/2022 00:01    Scheduled Meds:  [MAR Hold] bacitracin   Topical Once   [MAR Hold] pantoprazole  40 mg Oral Daily   [MAR Hold] verapamil  240 mg Oral Daily   Continuous Infusions:  sodium chloride 75 mL/hr at 02/07/22 1120   PRN Meds: [MAR Hold] acetaminophen, [MAR Hold] fentaNYL (SUBLIMAZE)  injection, fentaNYL (SUBLIMAZE) injection, [MAR Hold] labetalol, [MAR Hold] ondansetron (ZOFRAN) IV, ondansetron (ZOFRAN) IV, [MAR Hold] oxyCODONE, [MAR Hold] senna-docusate  Time spent: 35 minutes  Author: Val Riles. MD [facility] 02/07/2022 1:49 PM  To reach On-call, see care teams to locate Lisa attending and reach out to them via [email or web address]. If 7PM-7AM, please contact night-coverage If you still have difficulty reaching Lisa attending provider, please page [facility] (Director on Call) for [facility] on amion for assistance.

## 2022-02-07 NOTE — Transfer of Care (Signed)
Immediate Anesthesia Transfer of Care Note  Patient: Lisa Koch  Procedure(s) Performed: INTRAMEDULLARY (IM) NAIL INTERTROCHANTERIC (Right)  Patient Location: PACU  Anesthesia Type:General  Level of Consciousness: drowsy  Airway & Oxygen Therapy: Patient connected to nasal cannula oxygen  Post-op Assessment: Report given to RN  Post vital signs: stable  Last Vitals:  Vitals Value Taken Time  BP 160/80 02/07/22 1246  Temp    Pulse 101 02/07/22 1247  Resp 26 02/07/22 1247  SpO2 97 % 02/07/22 1247  Vitals shown include unvalidated device data.  Last Pain:  Vitals:   02/07/22 0735  TempSrc: Axillary  PainSc:          Complications: No notable events documented.

## 2022-02-07 NOTE — ED Notes (Signed)
Pt describing back pain to this RN, refusing any narcotics at this time. Asked for extra strength tylenol. Dr Dwyane Dee made aware.

## 2022-02-07 NOTE — Progress Notes (Signed)
Initial Nutrition Assessment  DOCUMENTATION CODES:   Not applicable  INTERVENTION:   - Once diet advanced, Ensure Enlive po BID, each supplement provides 350 kcal and 20 grams of protein.  - MVI with minerals daily  NUTRITION DIAGNOSIS:   Increased nutrient needs related to hip fracture, post-op healing as evidenced by estimated needs.  GOAL:   Patient will meet greater than or equal to 90% of their needs  MONITOR:   PO intake, Supplement acceptance, Weight trends  REASON FOR ASSESSMENT:   Consult Hip fracture protocol  ASSESSMENT:   88 year old female who presented to the ED on 11/24 after a mechanical fall. PMH of HTN, GERD, rheumatoid arthritis, moderate tricuspid regurgitation. Pt admitted with R intertrochanteric hip fx, R shoulder glenohumeral dislocation, and fx of greater tuberosity.  11/25 - s/p IM nailing of R femur with cephalomedullary device, closed management of R greater tuberosity fx  RD working remotely. Unable to speak with pt via phone call as pt currently in PACU. Pt is NPO for surgery. No meal completions charted at this time.  Reviewed weight history in chart. Pt with a 3.8 kg weight loss since 12/16/21. This is a 6.3% weight loss in less than 2 months which is clinically significant for timeframe. Pt is at risk for malnutrition.  RD to order oral nutrition supplements to aid pt in meeting kcal and protein needs once diet advanced and to promote post-op healing. Will also order daily MVI with minerals.  Medications reviewed and include: protonix IVF: NS @ 75 ml/hr  Labs reviewed: WBC 21.2  NUTRITION - FOCUSED PHYSICAL EXAM:  Unable to complete at this time. RD working remotely.  Diet Order:   Diet Order             Diet NPO time specified Except for: Sips with Meds, Ice Chips  Diet effective now                   EDUCATION NEEDS:   No education needs have been identified at this time  Skin:  Skin Assessment: Reviewed RN  Assessment (closed incision R hip)  Last BM:  no documented BM  Height:   Ht Readings from Last 1 Encounters:  02/06/22 5' (1.524 m)    Weight:   Wt Readings from Last 1 Encounters:  02/06/22 56.2 kg    BMI:  Body mass index is 24.22 kg/m.  Estimated Nutritional Needs:   Kcal:  1350-1550  Protein:  65-80 grams  Fluid:  1.4-1.6 L    Gustavus Bryant, MS, RD, LDN Inpatient Clinical Dietitian Please see AM[facility]ON for contact information.

## 2022-02-07 NOTE — H&P (Signed)
History and Physical    Lisa Koch DDU:[id number] DOB: 1924-04-29 DOA: 02/06/2022  PCP: Tracie Harrier, MD   Patient coming from: home   Chief Complaint: Fall with right hip pain   HPI: Lisa Koch is a 88 y.o. female with medical history significant for hypertension, GERD, rheumatoid arthritis, and moderate tricuspid regurgitation who presents to the emergency department with right hip pain and deformity after a fall.  She is accompanied by her son and daughter-in-law who assist with the history.  She had been having a good day when she lost her balance and fell.  She was complaining of right hip pain.  EMS noted day deformity involving the right leg and also possible right humerus deformity.  Patient's family is unsure if she hit her head or not.  She regularly has since the 3 steps in their house and her son states that she is also able to ascend a full flight of stairs.  She never complains of chest pain and there is no known history of CAD or pulmonary disease.    She was treated with 50 mcg of fentanyl prior to arrival in the ED.  ED Course: Upon arrival to the ED, patient is found to be afebrile and saturating mid 90s on room air with severely elevated blood pressure.  EKG demonstrates sinus rhythm with PAC.  Chest x-ray negative for acute cardiopulmonary disease.  Head CT and cervical spine CT are negative for acute findings.  Plain radiographs of the hip demonstrate acute right intertrochanteric femur fracture.  Radiographs of the right shoulder are concerning for anterior dislocation and fracture of the greater tuberosity of the right femoral head.  Orthopedic surgery was consulted by the ED physician, type and screen was performed, and the patient was treated with fentanyl and Zofran.  Review of Systems:  All other systems reviewed and apart from HPI, are negative.  Past Medical History:  Diagnosis Date   Anemia    Basal cell carcinoma    Benign neoplasm of colon     Diverticulosis    Hemorrhage of rectum and anus    Hx of adenomatous colonic polyps    Hypertension    Inflammatory arthritis    Inflammatory polyarthropathy (HCC)    Mitral insufficiency    Psoriatic arthropathy ([place])    Senile osteoporosis     Past Surgical History:  Procedure Laterality Date   ABDOMINAL HYSTERECTOMY     BREAST BIOPSY Left 2012   core - neg   BREAST BIOPSY Left 2013   excisional - neg   BREAST EXCISIONAL BIOPSY     BREAST EXCISIONAL BIOPSY     COLONOSCOPY     ESOPHAGOGASTRODUODENOSCOPY     SKIN BIOPSY      Social History:   reports that she has never smoked. She has never used smokeless tobacco. She reports that she does not drink alcohol and does not use drugs.  Allergies  Allergen Reactions   Nabumetone Swelling   Clindamycin Other (See Comments)    Other reaction(s): abdominal pain, Other (See Comments)    Family History  Problem Relation Age of Onset   Breast cancer Neg Hx      Prior to Admission medications   Medication Sig Start Date End Date Taking? Authorizing Provider  aspirin 81 MG EC tablet Take 81 mg by mouth daily.    [provider]  CALCIUM PO Take by mouth daily.    [provider]  diclofenac Sodium (VOLTAREN) 1 % GEL  Apply 4 g topically 4 (four) times daily as needed (MUSCLE/JOINT PAIN). 12/22/21   Emeterio Reeve, DO  folic acid (FOLVITE) 1 MG tablet Take 2 mg by mouth daily.    [provider]  lisinopril (ZESTRIL) 40 MG tablet Take 40 mg by mouth daily. 06/19/20   [provider]  methotrexate (RHEUMATREX) 2.5 MG tablet Take 10 mg by mouth once a week. 08/05/20   [provider]  Multiple Vitamin (MULTIVITAMIN) capsule Take 1 capsule by mouth daily.    [provider]  Multiple Vitamins-Minerals (PRESERVISION AREDS PO) Take 2 tablets by mouth daily.    [provider]  omeprazole (PRILOSEC) 10 MG capsule Take 10 mg by mouth daily. 07/03/20   [provider]  Potassium Chloride ER 20 MEQ TBCR Take 20 mEq by mouth daily. 06/20/20   [provider]  senna-docusate (SENOKOT-S) 8.6-50 MG tablet Take 2 tablets by mouth 2 (two) times daily. Until stooling regularly 12/22/21   Emeterio Reeve, DO  verapamil (CALAN-SR) 240 MG CR tablet Take 240 mg by mouth daily. 06/17/20   [provider]    Physical Exam: Vitals:   02/06/22 2300 02/06/22 2301 02/06/22 2330 02/07/22 0100  BP: (!) 187/84  (!) 181/88 (!) 170/75  Pulse: 83  80 79  Resp: (!) 26  15 (!) 26  Temp:      TempSrc:      SpO2: 93%  94% (!) 89%  Weight:  56.2 kg    Height:  5' (1.524 m)      Constitutional: NAD, no pallor or diaphoresis   Eyes: PERTLA, lids and conjunctivae normal ENMT: Mucous membranes are moist. Posterior pharynx clear of any exudate or lesions.   Neck: supple, no masses  Respiratory: no wheezing, no crackles. No accessory muscle use.  Cardiovascular: S1 & S2 heard, regular rate and rhythm. No extremity edema.   Abdomen: No distension, no tenderness, soft. Bowel sounds active.  Musculoskeletal: no clubbing / cyanosis. Right hip tenderness and deformity, neurovascularly intact. Rt shoulder deformity, neurovascularly intact.   Skin: skin tear on elbow. Warm, dry, well-perfused. Neurologic: Gross hearing deficit. Sensation intact. Moving all extremities. Alert and oriented.  Psychiatric: Calm. Cooperative.    Labs and Imaging on Admission: I have personally reviewed following labs and imaging studies  CBC: Recent Labs  Lab 02/06/22 2249  WBC 12.0*  NEUTROABS 8.1*  HGB 12.5  HCT 37.9  MCV 99.0  PLT 322   Basic Metabolic Panel: Recent Labs  Lab 02/06/22 2249  NA 137  K 3.5  CL 104  CO2 23  GLUCOSE 109*  BUN 21  CREATININE 0.74  CALCIUM 9.5   GFR: Estimated Creatinine Clearance: 33.1 mL/min (by C-G formula based on SCr of 0.74 mg/dL). Liver Function Tests: No results for input(s): "AST", "ALT", "ALKPHOS", "BILITOT", "PROT",  "ALBUMIN" in the last 168 hours. No results for input(s): "LIPASE", "AMYLASE" in the last 168 hours. No results for input(s): "AMMONIA" in the last 168 hours. Coagulation Profile: Recent Labs  Lab 02/06/22 2249  INR 1.0   Cardiac Enzymes: No results for input(s): "CKTOTAL", "CKMB", "CKMBINDEX", "TROPONINI" in the last 168 hours. BNP (last 3 results) No results for input(s): "PROBNP" in the last 8760 hours. HbA1C: No results for input(s): "HGBA1C" in the last 72 hours. CBG: No results for input(s): "GLUCAP" in the last 168 hours. Lipid Profile: No results for input(s): "CHOL", "HDL", "LDLCALC", "TRIG", "CHOLHDL", "LDLDIRECT" in the last 72 hours. Thyroid Function Tests: No results for  input(s): "TSH", "T4TOTAL", "FREET4", "T3FREE", "THYROIDAB" in the last 72 hours. Anemia Panel: No results for input(s): "VITAMINB12", "FOLATE", "FERRITIN", "TIBC", "IRON", "RETICCTPCT" in the last 72 hours. Urine analysis:    Component Value Date/Time   COLORURINE STRAW (A) 12/16/2021 2349   APPEARANCEUR CLEAR (A) 12/16/2021 2349   LABSPEC 1.013 12/16/2021 2349   PHURINE 6.0 12/16/2021 2349   GLUCOSEU NEGATIVE 12/16/2021 2349   HGBUR NEGATIVE 12/16/2021 2349   BILIRUBINUR NEGATIVE 12/16/2021 2349   KETONESUR 20 (A) 12/16/2021 2349   PROTEINUR NEGATIVE 12/16/2021 2349   NITRITE NEGATIVE 12/16/2021 2349   LEUKOCYTESUR NEGATIVE 12/16/2021 2349   Sepsis Labs: '@LABRCNTIP'$ (procalcitonin:4,lacticidven:4) )No results found for this or any previous visit (from the past 240 hour(s)).   Radiological Exams on Admission: DG Chest Portable 1 View  Result Date: 02/07/2022 CLINICAL DATA:  Status post fall. EXAM: PORTABLE CHEST 1 VIEW COMPARISON:  None Available. FINDINGS: The heart size and mediastinal contours are within normal limits. Low lung volumes are seen with stable moderate to marked severity elevation of the right hemidiaphragm. There is no evidence of an acute infiltrate, pleural effusion or  pneumothorax. Chronic sixth and seventh left rib fractures are seen. There is anterior dislocation of the right shoulder with an acute fracture of the greater tuberosity of the right humeral head. IMPRESSION: 1. Low lung volumes without acute cardiopulmonary disease. 2. Anterior dislocation of the right shoulder with and acute fracture of the greater tuberosity of the right humeral head. Electronically Signed   By: Virgina Norfolk M.D.   On: 02/07/2022 00:22   DG Forearm Right  Result Date: 02/07/2022 CLINICAL DATA:  Status post fall. EXAM: RIGHT FOREARM - 2 VIEW COMPARISON:  None Available. FINDINGS: There is no evidence of an acute fracture or dislocation. Chronic and degenerative changes are seen involving the lateral epicondyle of the distal right humerus. Soft tissues are unremarkable. IMPRESSION: Chronic and degenerative changes without evidence of an acute osseous abnormality. Electronically Signed   By: Virgina Norfolk M.D.   On: 02/07/2022 00:20   DG Hip Unilat  With Pelvis 2-3 Views Right  Result Date: 02/07/2022 CLINICAL DATA:  Status post fall. EXAM: DG HIP (WITH OR WITHOUT PELVIS) 2-3V RIGHT COMPARISON:  None Available. FINDINGS: There is an acute, comminuted fracture deformity seen extending through the inter trochanteric region of the proximal right femur. There is no evidence of dislocation. Moderate severity degenerative changes are seen in the form of joint space narrowing and acetabular sclerosis. IMPRESSION: Acute fracture of the proximal right femur. Electronically Signed   By: Virgina Norfolk M.D.   On: 02/07/2022 00:18   DG Humerus Right  Result Date: 02/07/2022 CLINICAL DATA:  Status post fall. EXAM: RIGHT HUMERUS - 2+ VIEW COMPARISON:  None Available. FINDINGS: There is anterior dislocation of the right humeral head with respect to the right glenoid. An acute, displaced fracture is also seen involving the greater tuberosity of the right humeral head. Moderate severity  soft tissue swelling is seen along the lateral aspect of the proximal to mid right humerus. IMPRESSION: Anterior dislocation of the right shoulder with an acute fracture of the greater tuberosity of the right humeral head. Electronically Signed   By: Virgina Norfolk M.D.   On: 02/07/2022 00:17   CT HEAD WO CONTRAST (5MM)  Result Date: 02/07/2022 CLINICAL DATA:  Head trauma, minor (Age >= 65y); Neck trauma (Age >= 65y). Per EMS pt has been staying with family for "rehab" after previous fall. Per EMS pt was walking and  fell face first. Per EMS pt with +shortening and rotation of R leg and possible deformation EXAM: CT HEAD WITHOUT CONTRAST CT CERVICAL SPINE WITHOUT CONTRAST TECHNIQUE: Multidetector CT imaging of the head and cervical spine was performed following the standard protocol without intravenous contrast. Multiplanar CT image reconstructions of the cervical spine were also generated. RADIATION DOSE REDUCTION: This exam was performed according to the departmental dose-optimization program which includes automated exposure control, adjustment of the mA and/or kV according to patient size and/or use of iterative reconstruction technique. COMPARISON:  CT head and C-spine 12/16/2021 FINDINGS: CT HEAD FINDINGS BRAIN: BRAIN Cerebral ventricle sizes are concordant with the degree of cerebral volume loss. Patchy and confluent areas of decreased attenuation are noted throughout the deep and periventricular white matter of the cerebral hemispheres bilaterally, compatible with chronic microvascular ischemic disease. No evidence of large-territorial acute infarction. No parenchymal hemorrhage. No mass lesion. No extra-axial collection. No mass effect or midline shift. No hydrocephalus. Basilar cisterns are patent. Vascular: No hyperdense vessel. Skull: No acute fracture or focal lesion. Sinuses/Orbits: Mucosal thickening of the maxillary sinuses. Paranasal sinuses and mastoid air cells are clear. Bilateral lens  replacement. Otherwise the orbits are unremarkable. Other: None. CT CERVICAL SPINE FINDINGS Alignment: Grade 1 anterolisthesis of C2 on C3, C3 on C4, C4 on C5. Mild retrolisthesis of C5 on C6. Skull base and vertebrae: Multilevel moderate severe degenerative changes spine most prominent at the C5-C6 level. Posterior longitudinal ligament calcification (2:45). Associated multilevel severe osseous neural foraminal stenosis. No severe osseous central canal stenosis. No acute fracture. No aggressive appearing focal osseous lesion or focal pathologic process. Soft tissues and spinal canal: No prevertebral fluid or swelling. No visible canal hematoma. Upper chest: Unremarkable. Other: Atherosclerotic plaque. Subcentimeter left thyroid gland hypodense nodule. Not clinically significant; no follow-up imaging recommended (ref: J Am Coll Radiol. 2015 Feb;12(2): 143-50). IMPRESSION: 1. No acute intracranial abnormality. 2. No acute displaced fracture or traumatic listhesis of the cervical spine. 3.  Aortic Atherosclerosis (ICD10-I70.0). Electronically Signed   By: Iven Finn M.D.   On: 02/07/2022 00:01   CT Cervical Spine Wo Contrast  Result Date: 02/07/2022 CLINICAL DATA:  Head trauma, minor (Age >= 65y); Neck trauma (Age >= 65y). Per EMS pt has been staying with family for "rehab" after previous fall. Per EMS pt was walking and fell face first. Per EMS pt with +shortening and rotation of R leg and possible deformation EXAM: CT HEAD WITHOUT CONTRAST CT CERVICAL SPINE WITHOUT CONTRAST TECHNIQUE: Multidetector CT imaging of the head and cervical spine was performed following the standard protocol without intravenous contrast. Multiplanar CT image reconstructions of the cervical spine were also generated. RADIATION DOSE REDUCTION: This exam was performed according to the departmental dose-optimization program which includes automated exposure control, adjustment of the mA and/or kV according to patient size and/or use  of iterative reconstruction technique. COMPARISON:  CT head and C-spine 12/16/2021 FINDINGS: CT HEAD FINDINGS BRAIN: BRAIN Cerebral ventricle sizes are concordant with the degree of cerebral volume loss. Patchy and confluent areas of decreased attenuation are noted throughout the deep and periventricular white matter of the cerebral hemispheres bilaterally, compatible with chronic microvascular ischemic disease. No evidence of large-territorial acute infarction. No parenchymal hemorrhage. No mass lesion. No extra-axial collection. No mass effect or midline shift. No hydrocephalus. Basilar cisterns are patent. Vascular: No hyperdense vessel. Skull: No acute fracture or focal lesion. Sinuses/Orbits: Mucosal thickening of the maxillary sinuses. Paranasal sinuses and mastoid air cells are clear. Bilateral lens replacement. Otherwise the  orbits are unremarkable. Other: None. CT CERVICAL SPINE FINDINGS Alignment: Grade 1 anterolisthesis of C2 on C3, C3 on C4, C4 on C5. Mild retrolisthesis of C5 on C6. Skull base and vertebrae: Multilevel moderate severe degenerative changes spine most prominent at the C5-C6 level. Posterior longitudinal ligament calcification (2:45). Associated multilevel severe osseous neural foraminal stenosis. No severe osseous central canal stenosis. No acute fracture. No aggressive appearing focal osseous lesion or focal pathologic process. Soft tissues and spinal canal: No prevertebral fluid or swelling. No visible canal hematoma. Upper chest: Unremarkable. Other: Atherosclerotic plaque. Subcentimeter left thyroid gland hypodense nodule. Not clinically significant; no follow-up imaging recommended (ref: J Am Coll Radiol. 2015 Feb;12(2): 143-50). IMPRESSION: 1. No acute intracranial abnormality. 2. No acute displaced fracture or traumatic listhesis of the cervical spine. 3.  Aortic Atherosclerosis (ICD10-I70.0). Electronically Signed   By: Iven Finn M.D.   On: 02/07/2022 00:01    EKG:  Independently reviewed. Sinus rhythm, PAC.   Assessment/Plan  1. Right hip fracture  - Appreciate orthopedic surgery consultation  - Based on the available data, Mrs. Walthour presents an estimated 1.6% risk of perioperative MI or cardiac arrest; no further cardiac evaluation indicated prior to surgery  - Keep NPO, hold ASA and lisinopril, continue pain-control, gentle IVF hydration     2. Anterior dislocation right shoulder; fracture of greater tuberosity of right humerus  - Planned for closed reduction in ED with follow-up imaging and orthopedic surgery consultation   3. Hypertensive urgency  - BP severely elevated in ED in setting of pain and anxiety regarding her current situation  - No evidence for end-organ injury \ - Continue pain-control, continue verapamil, hold lisinopril perioperatively, and use labetalol if needed    DVT prophylaxis: SCD Code Status: DNR   Level of Care: Level of care: Med-Surg Family Communication: Son and daughter-in-law at bedside  Disposition Plan:  Patient is from: Home  Anticipated d/c is to: SNF  Anticipated d/c date is: 02/10/22  Patient currently: Pending treatment of right shoulder fracture dislocation and right hip fracture, disposition planning  Consults called: orthopedic surgery  Admission status: Inpatient     Vianne Bulls, MD [facility]  02/07/2022, 1:23 AM

## 2022-02-07 NOTE — Op Note (Signed)
DATE OF SURGERY: 02/07/2022  PREOPERATIVE DIAGNOSIS:  Right intertrochanteric hip fracture Right greater tuberosity fracture and glenohumeral dislocation  POSTOPERATIVE DIAGNOSIS:  Right intertrochanteric hip fracture Right greater tuberosity fracture and glenohumeral dislocation  PROCEDURE:  Intramedullary nailing of Right femur with cephalomedullary device Closed management of R greater tuberosity fracture  SURGEON: Cato Mulligan, MD  ANESTHESIA: Gen  EBL: 100 cc  IVF: per anesthesia record  COMPONENTS:  [employer]igen Intertan Short Nail: 10x151m; 965mlag screw with 9069mompression screw; 5x 25m78mstal cortical interlocking screw  INDICATIONS: Lisa MESSINGERa 89 y38. female who sustained an intertrochanteric fracture after a fall. She also sustained a R glenohumeral dislocation and greater tuberosity fracture. Dislocation was reduced in the ED. Postoperative shoulder imaging showed a minimally displaced greater tuberosity fracture. We agreed to manage this nonoperatively. Regarding her R hip fracture, risks and benefits of intramedullary nailing were explained to the patient and/or family . Risks include but are not limited to bleeding, infection, injury to tissues, nerves, vessels, nonunion/malunion, hardware failure, limb length discrepancy/hip rotation mismatch and risks of anesthesia. The patient and/or family understand these risks, have completed an informed consent, and wish to proceed.   PROCEDURE:  The patient was brought into the operating room. After administering anesthesia, the patient was placed in the supine position on the Hana table. The uninjured leg was placed in an extended position while the injured lower extremity was placed in longitudinal traction. The hip fracture was reduced using longitudinal traction and internal rotation. The adequacy of reduction was verified fluoroscopically in AP and lateral projections and found to be acceptable. The  lateral aspect of the hip and thigh were prepped with ChloraPrep solution before being draped sterilely. Preoperative IV antibiotics were administered. A timeout was performed to verify the appropriate surgical site, patient, and procedure.    The greater trochanter was identified and an approximately 6 cm incision was made about 3 fingerbreadths above the tip of the greater trochanter. The incision was carried down through the subcutaneous tissues to expose the gluteal fascia. This was split the length of the incision, providing access to the tip of the trochanter. Under fluoroscopic guidance, a guidewire was drilled through the tip of the trochanter into the proximal metaphysis to the level of the lesser trochanter. After verifying its position fluoroscopically in AP and lateral projections, it was overreamed with the opening reamer to the level of the lesser trochanter. The nail was selected and advanced to the appropriate depth as verified fluoroscopically.    The guide system for the lag screw was positioned and advanced through an approximately 5cm incision over the lateral aspect of the proximal femur. The guidewire was drilled up through the femoral nail and into the femoral neck to rest within 5 mm of subchondral bone. After verifying its position in the femoral neck and head in both AP and lateral projections, the guidewire was measured and appropriate sized lag screw was selected.  The channel for the compression screw was drilled and antirotation bar was placed.  Lag screw was drilled and placed in appropriate position.  Compression screw was then placed.  Appropriate compression was achieved.  The set screw was locked in place. Again, the adequacy of hardware position and fracture reduction was verified fluoroscopically in AP and lateral projections.   Attention was then turned to the distal interlocking screw in the diaphysis. Using a targeted assembly, a stab incision was made and hole was  drilled through the nail.  An interlocking screw was placed with excellent purchase.  Appropriate screw position was verified fluoroscopically in AP and lateral projections.   The wounds were irrigated thoroughly with sterile saline solution. Local anesthetic was injected into the wounds. The subcutaneous tissues were closed using 2-0 Vicryl interrupted sutures. The skin was closed using staples. Sterile occlusive dressings were applied to all wounds.   The right greater tuberosity fracture was treated in a closed fashion by carefully placing the shoulder in a sling immobilizer.   The patient was then transferred to the recovery room in satisfactory condition.   POSTOPERATIVE PLAN: The patient will be WBAT on the RLE (operative extremity). Keep R arm in sling, but may bear weight through forearm/ shoulder for mobilization, but avoid active range of motion. Lovenox '40mg'$ /day x 4 weeks to start on POD#1. Perioperative IV antibiotics x 24 hours. PT/OT on POD#1.

## 2022-02-07 NOTE — Consult Note (Signed)
ORTHOPAEDIC CONSULTATION  REQUESTING PHYSICIAN: Val Riles, MD  Chief Complaint:   R hip pain  History of Present Illness: Lisa Koch is a 88 y.o. femalewho had a fall yesterday after she lost her balance.  The patient noted immediate R hip pain and inability to ambulate. She also had significant R shoulder pain. The patient ambulates with a walker intermittently.  The patient usually lives at home and is relatively independent with ADLs.  However, she did have  a fall ~1 month ago and was living with her son and daughter-in-law and was progressing well. X-rays in the emergency department show a right intertrochanteric hip fracture as well as a R anterior glenohumeral dislocation with greater tuberosity fracture. She underwent closed reduction of the R shoulder in the ED.   Past Medical History:  Diagnosis Date   Anemia    Basal cell carcinoma    Benign neoplasm of colon    Diverticulosis    Hemorrhage of rectum and anus    Hx of adenomatous colonic polyps    Hypertension    Inflammatory arthritis    Inflammatory polyarthropathy (HCC)    Mitral insufficiency    Psoriatic arthropathy ([place])    Senile osteoporosis    Past Surgical History:  Procedure Laterality Date   ABDOMINAL HYSTERECTOMY     BREAST BIOPSY Left 2012   core - neg   BREAST BIOPSY Left 2013   excisional - neg   BREAST EXCISIONAL BIOPSY     BREAST EXCISIONAL BIOPSY     COLONOSCOPY     ESOPHAGOGASTRODUODENOSCOPY     SKIN BIOPSY     Social History   Socioeconomic History   Marital status: Widowed    Spouse name: Not on file   Number of children: Not on file   Years of education: Not on file   Highest education level: Not on file  Occupational History   Not on file  Tobacco Use   Smoking status: Never   Smokeless tobacco: Never  Substance and Sexual Activity   Alcohol use: Never   Drug use: Never   Sexual activity: Not on file   Other Topics Concern   Not on file  Social History Narrative   Not on file   Social Determinants of Health   Financial Resource Strain: Not on file  Food Insecurity: No Food Insecurity (12/17/2021)   Hunger Vital Sign    Worried About Running Out of Food in the Last Year: Never true    Ran Out of Food in the Last Year: Never true  Transportation Needs: No Transportation Needs (12/17/2021)   PRAPARE - Hydrologist (Medical): No    Lack of Transportation (Non-Medical): No  Physical Activity: Not on file  Stress: Not on file  Social Connections: Not on file   Family History  Problem Relation Age of Onset   Breast cancer Neg Hx    Allergies  Allergen Reactions   Nabumetone Swelling   Clindamycin Other (See Comments)    Other reaction(s): abdominal pain, Other (See Comments)   Prior to Admission medications   Medication Sig Start Date End Date Taking? Authorizing Provider  aspirin 81 MG EC tablet Take 81 mg by mouth daily.    [provider]  CALCIUM PO Take by mouth daily.    [provider]  diclofenac Sodium (VOLTAREN) 1 % GEL Apply 4 g topically 4 (four) times daily as needed (MUSCLE/JOINT PAIN). 12/22/21   Emeterio Reeve, DO  folic acid (FOLVITE) 1 MG tablet Take 2 mg by mouth daily.    [provider]  lisinopril (ZESTRIL) 40 MG tablet Take 40 mg by mouth daily. 06/19/20   [provider]  methotrexate (RHEUMATREX) 2.5 MG tablet Take 10 mg by mouth once a week. 08/05/20   [provider]  Multiple Vitamin (MULTIVITAMIN) capsule Take 1 capsule by mouth daily.    [provider]  Multiple Vitamins-Minerals (PRESERVISION AREDS PO) Take 2 tablets by mouth daily.    [provider]  omeprazole (PRILOSEC) 10 MG capsule Take 10 mg by mouth daily. 07/03/20   [provider]  Potassium Chloride ER 20 MEQ TBCR Take 20 mEq by mouth daily. 06/20/20   [provider]  senna-docusate  (SENOKOT-S) 8.6-50 MG tablet Take 2 tablets by mouth 2 (two) times daily. Until stooling regularly 12/22/21   Emeterio Reeve, DO  verapamil (CALAN-SR) 240 MG CR tablet Take 240 mg by mouth daily. 06/17/20   [provider]   Recent Labs    02/06/22 2249 02/07/22 0429  WBC 12.0* 21.2*  HGB 12.5 11.3*  HCT 37.9 33.9*  PLT 248 212  K 3.5 4.6  CL 104 105  CO2 23 23  BUN 21 18  CREATININE 0.74 0.63  GLUCOSE 109* 152*  CALCIUM 9.5 8.8*  INR 1.0  --    CT Shoulder Right Wo Contrast  Result Date: 02/07/2022 CLINICAL DATA:  Right shoulder fracture. EXAM: CT OF THE UPPER RIGHT EXTREMITY WITHOUT CONTRAST TECHNIQUE: Multidetector CT imaging of the upper right extremity was performed according to the standard protocol. RADIATION DOSE REDUCTION: This exam was performed according to the departmental dose-optimization program which includes automated exposure control, adjustment of the mA and/or kV according to patient size and/or use of iterative reconstruction technique. COMPARISON:  Radiographs, same date. FINDINGS: Comminuted and mildly displaced fractures of the greater tuberosity. No humeral neck fracture. The humeral head is normally located in the glenoid fossa. No glenoid fracture. The [facility] joint is intact. No clavicle or scapular fractures. The visualized right ribs are intact. Grossly by CT the rotator cuff tendons are intact. IMPRESSION: 1. Comminuted and mildly displaced fractures of the greater tuberosity. 2. No humeral neck fracture. 3. Grossly by CT the rotator cuff tendons are intact. Electronically Signed   By: Marijo Sanes M.D.   On: 02/07/2022 08:51   DG Shoulder Right Portable  Result Date: 02/07/2022 CLINICAL DATA:  Status post reduction EXAM: RIGHT SHOULDER - 1 VIEW COMPARISON:  Films from earlier in the same day. FINDINGS: Humeral head is been reduced into the glenoid. Previously seen comminuted fractures involving the greater tuberosity are again noted and less  displaced. No new fracture is seen. IMPRESSION: Status post reduction. Electronically Signed   By: Inez Catalina M.D.   On: 02/07/2022 02:09   DG Chest Portable 1 View  Result Date: 02/07/2022 CLINICAL DATA:  Status post fall. EXAM: PORTABLE CHEST 1 VIEW COMPARISON:  None Available. FINDINGS: The heart size and mediastinal contours are within normal limits. Low lung volumes are seen with stable moderate to marked severity elevation of the right hemidiaphragm. There is no evidence of an acute infiltrate, pleural effusion or pneumothorax. Chronic sixth and seventh left rib fractures are seen. There is anterior dislocation of the right shoulder with an acute fracture of the greater tuberosity of the right humeral head. IMPRESSION: 1. Low lung volumes without acute cardiopulmonary disease. 2. Anterior dislocation of the right shoulder with and acute fracture of  the greater tuberosity of the right humeral head. Electronically Signed   By: Virgina Norfolk M.D.   On: 02/07/2022 00:22   DG Forearm Right  Result Date: 02/07/2022 CLINICAL DATA:  Status post fall. EXAM: RIGHT FOREARM - 2 VIEW COMPARISON:  None Available. FINDINGS: There is no evidence of an acute fracture or dislocation. Chronic and degenerative changes are seen involving the lateral epicondyle of the distal right humerus. Soft tissues are unremarkable. IMPRESSION: Chronic and degenerative changes without evidence of an acute osseous abnormality. Electronically Signed   By: Virgina Norfolk M.D.   On: 02/07/2022 00:20   DG Hip Unilat  With Pelvis 2-3 Views Right  Result Date: 02/07/2022 CLINICAL DATA:  Status post fall. EXAM: DG HIP (WITH OR WITHOUT PELVIS) 2-3V RIGHT COMPARISON:  None Available. FINDINGS: There is an acute, comminuted fracture deformity seen extending through the inter trochanteric region of the proximal right femur. There is no evidence of dislocation. Moderate severity degenerative changes are seen in the form of joint  space narrowing and acetabular sclerosis. IMPRESSION: Acute fracture of the proximal right femur. Electronically Signed   By: Virgina Norfolk M.D.   On: 02/07/2022 00:18   DG Humerus Right  Result Date: 02/07/2022 CLINICAL DATA:  Status post fall. EXAM: RIGHT HUMERUS - 2+ VIEW COMPARISON:  None Available. FINDINGS: There is anterior dislocation of the right humeral head with respect to the right glenoid. An acute, displaced fracture is also seen involving the greater tuberosity of the right humeral head. Moderate severity soft tissue swelling is seen along the lateral aspect of the proximal to mid right humerus. IMPRESSION: Anterior dislocation of the right shoulder with an acute fracture of the greater tuberosity of the right humeral head. Electronically Signed   By: Virgina Norfolk M.D.   On: 02/07/2022 00:17   CT HEAD WO CONTRAST (5MM)  Result Date: 02/07/2022 CLINICAL DATA:  Head trauma, minor (Age >= 65y); Neck trauma (Age >= 65y). Per EMS pt has been staying with family for "rehab" after previous fall. Per EMS pt was walking and fell face first. Per EMS pt with +shortening and rotation of R leg and possible deformation EXAM: CT HEAD WITHOUT CONTRAST CT CERVICAL SPINE WITHOUT CONTRAST TECHNIQUE: Multidetector CT imaging of the head and cervical spine was performed following the standard protocol without intravenous contrast. Multiplanar CT image reconstructions of the cervical spine were also generated. RADIATION DOSE REDUCTION: This exam was performed according to the departmental dose-optimization program which includes automated exposure control, adjustment of the mA and/or kV according to patient size and/or use of iterative reconstruction technique. COMPARISON:  CT head and C-spine 12/16/2021 FINDINGS: CT HEAD FINDINGS BRAIN: BRAIN Cerebral ventricle sizes are concordant with the degree of cerebral volume loss. Patchy and confluent areas of decreased attenuation are noted throughout the deep  and periventricular white matter of the cerebral hemispheres bilaterally, compatible with chronic microvascular ischemic disease. No evidence of large-territorial acute infarction. No parenchymal hemorrhage. No mass lesion. No extra-axial collection. No mass effect or midline shift. No hydrocephalus. Basilar cisterns are patent. Vascular: No hyperdense vessel. Skull: No acute fracture or focal lesion. Sinuses/Orbits: Mucosal thickening of the maxillary sinuses. Paranasal sinuses and mastoid air cells are clear. Bilateral lens replacement. Otherwise the orbits are unremarkable. Other: None. CT CERVICAL SPINE FINDINGS Alignment: Grade 1 anterolisthesis of C2 on C3, C3 on C4, C4 on C5. Mild retrolisthesis of C5 on C6. Skull base and vertebrae: Multilevel moderate severe degenerative changes spine most prominent at the C5-C6  level. Posterior longitudinal ligament calcification (2:45). Associated multilevel severe osseous neural foraminal stenosis. No severe osseous central canal stenosis. No acute fracture. No aggressive appearing focal osseous lesion or focal pathologic process. Soft tissues and spinal canal: No prevertebral fluid or swelling. No visible canal hematoma. Upper chest: Unremarkable. Other: Atherosclerotic plaque. Subcentimeter left thyroid gland hypodense nodule. Not clinically significant; no follow-up imaging recommended (ref: J Am Coll Radiol. 2015 Feb;12(2): 143-50). IMPRESSION: 1. No acute intracranial abnormality. 2. No acute displaced fracture or traumatic listhesis of the cervical spine. 3.  Aortic Atherosclerosis (ICD10-I70.0). Electronically Signed   By: Iven Finn M.D.   On: 02/07/2022 00:01   CT Cervical Spine Wo Contrast  Result Date: 02/07/2022 CLINICAL DATA:  Head trauma, minor (Age >= 65y); Neck trauma (Age >= 65y). Per EMS pt has been staying with family for "rehab" after previous fall. Per EMS pt was walking and fell face first. Per EMS pt with +shortening and rotation of R leg  and possible deformation EXAM: CT HEAD WITHOUT CONTRAST CT CERVICAL SPINE WITHOUT CONTRAST TECHNIQUE: Multidetector CT imaging of the head and cervical spine was performed following the standard protocol without intravenous contrast. Multiplanar CT image reconstructions of the cervical spine were also generated. RADIATION DOSE REDUCTION: This exam was performed according to the departmental dose-optimization program which includes automated exposure control, adjustment of the mA and/or kV according to patient size and/or use of iterative reconstruction technique. COMPARISON:  CT head and C-spine 12/16/2021 FINDINGS: CT HEAD FINDINGS BRAIN: BRAIN Cerebral ventricle sizes are concordant with the degree of cerebral volume loss. Patchy and confluent areas of decreased attenuation are noted throughout the deep and periventricular white matter of the cerebral hemispheres bilaterally, compatible with chronic microvascular ischemic disease. No evidence of large-territorial acute infarction. No parenchymal hemorrhage. No mass lesion. No extra-axial collection. No mass effect or midline shift. No hydrocephalus. Basilar cisterns are patent. Vascular: No hyperdense vessel. Skull: No acute fracture or focal lesion. Sinuses/Orbits: Mucosal thickening of the maxillary sinuses. Paranasal sinuses and mastoid air cells are clear. Bilateral lens replacement. Otherwise the orbits are unremarkable. Other: None. CT CERVICAL SPINE FINDINGS Alignment: Grade 1 anterolisthesis of C2 on C3, C3 on C4, C4 on C5. Mild retrolisthesis of C5 on C6. Skull base and vertebrae: Multilevel moderate severe degenerative changes spine most prominent at the C5-C6 level. Posterior longitudinal ligament calcification (2:45). Associated multilevel severe osseous neural foraminal stenosis. No severe osseous central canal stenosis. No acute fracture. No aggressive appearing focal osseous lesion or focal pathologic process. Soft tissues and spinal canal: No  prevertebral fluid or swelling. No visible canal hematoma. Upper chest: Unremarkable. Other: Atherosclerotic plaque. Subcentimeter left thyroid gland hypodense nodule. Not clinically significant; no follow-up imaging recommended (ref: J Am Coll Radiol. 2015 Feb;12(2): 143-50). IMPRESSION: 1. No acute intracranial abnormality. 2. No acute displaced fracture or traumatic listhesis of the cervical spine. 3.  Aortic Atherosclerosis (ICD10-I70.0). Electronically Signed   By: Iven Finn M.D.   On: 02/07/2022 00:01     Positive ROS: All other systems have been reviewed and were otherwise negative with the exception of those mentioned in the HPI and as above.  Physical Exam: BP (!) 148/89   Pulse (!) 103   Temp (!) 97.5 F (36.4 C) (Axillary)   Resp (!) 23   Ht 5' (1.524 m)   Wt 56.2 kg   SpO2 97%   BMI 24.22 kg/m  General:  Alert, no acute distress Psychiatric:  Patient is competent for consent with normal mood  and affect   Cardiovascular:  No pedal edema, regular rate and rhythm Respiratory:  No wheezing, non-labored breathing GI:  Abdomen is soft and non-tender Skin:  No lesions in the area of chief complaint, no erythema Neurologic:  Sensation intact distally, CN grossly intact Lymphatic:  No axillary or cervical lymphadenopathy  Orthopedic Exam:  RLE: 5/5 DF/PF/EHL SILT s/s/t/sp/dp distr Foot wwp +Log roll/axial load Leg shortened and externally rotated  RUE: +ain/pin/u motor SILT r/u/m/ax +rad pulse RoM: Passive range of motion to 100 degrees forward flexion, 30 ER.  Weakness to ER with elbow by the side.  Unable to currently actively lift arm overhead.    X-rays:  As above: R intertrochanteric hip fracture, R glenohumeral dislocation with subsequent reduction  Assessment/Plan: Lisa Koch is a 88 y.o. female with a R intertrochanteric hip fracture and R minimally displaced greater tuberosity fracture after glenohumeral dislocation that was reduced.    1. I  discussed the various treatment options including both surgical and non-surgical management of the fracture with the patient and/or family (medical PoA). We discussed the high risk of perioperative complications due to patient's age and other co-morbidities. After discussion of risks, benefits, and alternatives to surgery, the family and/or patient were in agreement to proceed.  The goals of surgery would be to provide adequate pain relief and allow for mobilization. Plan for surgery is R hip cephalomedullary nailing today, 02/07/2022.  Given the minimal displacement of the right greater tuberosity fracture, we agreed to proceed with nonoperative management of this fracture. 2. NPO until OR 3. Hold anticoagulation in advance of OR      Leim Fabry   02/07/2022 10:41 AM

## 2022-02-07 NOTE — Anesthesia Procedure Notes (Signed)
Procedure Name: LMA Insertion Date/Time: 02/07/2022 11:42 AM  Performed by: Orion Crook, CRNAPre-anesthesia Checklist: Patient identified, Emergency Drugs available, Suction available and Patient being monitored Patient Re-evaluated:Patient Re-evaluated prior to induction Oxygen Delivery Method: Circle system utilized Preoxygenation: Pre-oxygenation with 100% oxygen Induction Type: IV induction Ventilation: Mask ventilation without difficulty LMA: LMA inserted LMA Size: 4.0 Number of attempts: 1 Tube secured with: Tape Dental Injury: Teeth and Oropharynx as per pre-operative assessment

## 2022-02-07 NOTE — H&P (Signed)
H&P reviewed. No significant changes noted.  

## 2022-02-07 NOTE — Anesthesia Preprocedure Evaluation (Signed)
Anesthesia Evaluation  Patient identified by MRN, date of birth, ID band Patient awake    Reviewed: Allergy & Precautions, H&P , NPO status , Patient's Chart, lab work & pertinent test results, reviewed documented beta blocker date and time   History of Anesthesia Complications Negative for: history of anesthetic complications  Airway Mallampati: III  TM Distance: >3 FB Neck ROM: full    Dental no notable dental hx. (+) Caps, Dental Advidsory Given, Missing, Teeth Intact   Pulmonary neg pulmonary ROS   Pulmonary exam normal breath sounds clear to auscultation       Cardiovascular Exercise Tolerance: Good hypertension, (-) angina (-) Past MI and (-) Cardiac Stents Normal cardiovascular exam(-) dysrhythmias + Valvular Problems/Murmurs  Rhythm:regular Rate:Normal     Neuro/Psych negative neurological ROS  negative psych ROS   GI/Hepatic Neg liver ROS,GERD  ,,  Endo/Other  negative endocrine ROS    Renal/GU negative Renal ROS  negative genitourinary   Musculoskeletal   Abdominal   Peds  Hematology negative hematology ROS (+)   Anesthesia Other Findings Past Medical History: No date: Anemia No date: Basal cell carcinoma No date: Benign neoplasm of colon No date: Diverticulosis No date: Hemorrhage of rectum and anus No date: Hx of adenomatous colonic polyps No date: Hypertension No date: Inflammatory arthritis No date: Inflammatory polyarthropathy (HCC) No date: Mitral insufficiency No date: Psoriatic arthropathy (HCC) No date: Senile osteoporosis   Reproductive/Obstetrics negative OB ROS                             Anesthesia Physical Anesthesia Plan  ASA: 2  Anesthesia Plan: General   Post-op Pain Management:    Induction: Intravenous  PONV Risk Score and Plan: 3 and Ondansetron, Dexamethasone and Treatment may vary due to age or medical condition  Airway Management Planned:  LMA  Additional Equipment:   Intra-op Plan:   Post-operative Plan: Extubation in OR  Informed Consent: I have reviewed the patients History and Physical, chart, labs and discussed the procedure including the risks, benefits and alternatives for the proposed anesthesia with the patient or authorized representative who has indicated his/her understanding and acceptance.     Dental Advisory Given  Plan Discussed with: Anesthesiologist, CRNA and Surgeon  Anesthesia Plan Comments:        Anesthesia Quick Evaluation

## 2022-02-07 NOTE — Progress Notes (Signed)
Full consult note and discussion with patient to follow.  Called by ED staff. Imaging reviewed. Pt with R intertrochanteric hip fx + R shoulder glenohumeral dislocation + fracture of greater tuberosity - Plan for closed reduction of R shoulder in ED. XR + CT scan post-reduction. Possible surgical fixation of greater tuberosity pending post reduction imaging.  - Plan for surgery on R hip tomorrow, likely afternoon.  - NPO until OR - Hold anticoagulation - Admit to Hospitalist team.

## 2022-02-08 DIAGNOSIS — S72001A Fracture of unspecified part of neck of right femur, initial encounter for closed fracture: Secondary | ICD-10-CM | POA: Diagnosis not present

## 2022-02-08 LAB — CBC
HCT: 20.9 % — ABNORMAL LOW (ref 36.0–46.0)
Hemoglobin: 7 g/dL — ABNORMAL LOW (ref 12.0–15.0)
MCH: 33 pg (ref 26.0–34.0)
MCHC: 33.5 g/dL (ref 30.0–36.0)
MCV: 98.6 fL (ref 80.0–100.0)
Platelets: 144 10*3/uL — ABNORMAL LOW (ref 150–400)
RBC: 2.12 MIL/uL — ABNORMAL LOW (ref 3.87–5.11)
RDW: 13.9 % (ref 11.5–15.5)
WBC: 14.8 10*3/uL — ABNORMAL HIGH (ref 4.0–10.5)
nRBC: 0 % (ref 0.0–0.2)

## 2022-02-08 LAB — BASIC METABOLIC PANEL
Anion gap: 4 — ABNORMAL LOW (ref 5–15)
BUN: 18 mg/dL (ref 8–23)
CO2: 23 mmol/L (ref 22–32)
Calcium: 7.6 mg/dL — ABNORMAL LOW (ref 8.9–10.3)
Chloride: 107 mmol/L (ref 98–111)
Creatinine, Ser: 0.73 mg/dL (ref 0.44–1.00)
GFR, Estimated: >60 mL/min (ref >60)
Glucose, Bld: 113 mg/dL — ABNORMAL HIGH (ref 70–99)
Potassium: 4.2 mmol/L (ref 3.5–5.1)
Sodium: 134 mmol/L — ABNORMAL LOW (ref 135–145)

## 2022-02-08 LAB — MAGNESIUM: Magnesium: 1.8 mg/dL (ref 1.7–2.4)

## 2022-02-08 LAB — PHOSPHORUS: Phosphorus: 2.6 mg/dL (ref 2.5–4.6)

## 2022-02-08 LAB — VITAMIN D 25 HYDROXY (VIT D DEFICIENCY, FRACTURES): Vit D, 25-Hydroxy: 68.25 ng/mL (ref 30–100)

## 2022-02-08 MED ORDER — ASPIRIN 81 MG PO TBEC
81.0000 mg | DELAYED_RELEASE_TABLET | Freq: Every day | ORAL | Status: DC
  Administered 2022-02-08 – 2022-02-11 (×4): 81 mg via ORAL
  Filled 2022-02-08 (×4): qty 1

## 2022-02-08 MED ORDER — ENOXAPARIN SODIUM 40 MG/0.4ML IJ SOSY: 40.0000 mg | PREFILLED_SYRINGE | INTRAMUSCULAR | 0 refills | Status: DC

## 2022-02-08 MED ORDER — OXYCODONE HCL 5 MG PO TABS: 2.5000 mg | ORAL_TABLET | ORAL | 0 refills | Status: DC | PRN

## 2022-02-08 MED ORDER — SODIUM CHLORIDE 0.9 % IV BOLUS
250.0000 mL | Freq: Once | INTRAVENOUS | Status: AC
  Administered 2022-02-08: 250 mL via INTRAVENOUS

## 2022-02-08 MED ORDER — TRAMADOL HCL 50 MG PO TABS: 50.0000 mg | ORAL_TABLET | Freq: Four times a day (QID) | ORAL | 0 refills | Status: DC | PRN

## 2022-02-08 MED ORDER — SENNOSIDES-DOCUSATE SODIUM 8.6-50 MG PO TABS
2.0000 | ORAL_TABLET | Freq: Two times a day (BID) | ORAL | Status: DC
  Administered 2022-02-08 – 2022-02-11 (×7): 2 via ORAL
  Filled 2022-02-08 (×7): qty 2

## 2022-02-08 NOTE — Progress Notes (Signed)
       CROSS COVER NOTE  NAME: Lisa Koch MRN: [id number] DOB : Apr 01, 1924    Time of Service   [phone]  HPI/Events of Note   Nursing report no urine output and 383 ml on bladder scan  Assessment and  Interventions   Assessment:  Plan: 250 ML NS bolus       Kathlene Cote NP [facility]

## 2022-02-08 NOTE — Anesthesia Postprocedure Evaluation (Signed)
Anesthesia Post Note  Patient: Lisa Koch  Procedure(s) Performed: INTRAMEDULLARY (IM) NAIL INTERTROCHANTERIC (Right)  Patient location during evaluation: PACU Anesthesia Type: General Level of consciousness: awake and alert Pain management: pain level controlled Vital Signs Assessment: post-procedure vital signs reviewed and stable Respiratory status: spontaneous breathing, nonlabored ventilation, respiratory function stable and patient connected to nasal cannula oxygen Cardiovascular status: blood pressure returned to baseline and stable Postop Assessment: no apparent nausea or vomiting Anesthetic complications: no   No notable events documented.   Last Vitals:  Vitals:   02/08/22 0355 02/08/22 0753  BP: 123/76 (!) 129/59  Pulse: 95 81  Resp: 17 16  Temp: 36.7 C 36.8 C  SpO2: 98% 95%    Last Pain:  Vitals:   02/07/22 2110  TempSrc:   PainSc: 0-No pain                 Martha Clan

## 2022-02-08 NOTE — Progress Notes (Signed)
  Subjective: 1 Day Post-Op Procedure(s) (LRB): INTRAMEDULLARY (IM) NAIL INTERTROCHANTERIC (Right) Patient reports pain as mild.   Patient is well, and has had no acute complaints or problems Plan is to go Skilled nursing facility after hospital stay. Negative for chest pain and shortness of breath Fever: no Gastrointestinal: Negative for nausea and vomiting  Objective: Vital signs in last 24 hours: Temp:  [97.9 F (36.6 C)-99.1 F (37.3 C)] 98 F (36.7 C) (11/26 0355) Pulse Rate:  [92-111] 95 (11/26 0355) Resp:  [13-29] 17 (11/26 0355) BP: (123-177)/(63-89) 123/76 (11/26 0355) SpO2:  [85 %-98 %] 98 % (11/26 0355)  Intake/Output from previous day:  Intake/Output Summary (Last 24 hours) at 02/08/2022 0750 Last data filed at 02/07/2022 2110 Gross per 24 hour  Intake 2079.69 ml  Output 100 ml  Net 1979.69 ml    Intake/Output this shift: No intake/output data recorded.  Labs: Recent Labs    02/06/22 2249 02/07/22 0429  HGB 12.5 11.3*   Recent Labs    02/06/22 2249 02/07/22 0429  WBC 12.0* 21.2*  RBC 3.83* 3.40*  HCT 37.9 33.9*  PLT 248 212   Recent Labs    02/07/22 0429 02/08/22 0508  NA 136 134*  K 4.6 4.2  CL 105 107  CO2 23 23  BUN 18 18  CREATININE 0.63 0.73  GLUCOSE 152* 113*  CALCIUM 8.8* 7.6*   Recent Labs    02/06/22 2249  INR 1.0     EXAM General - Patient is Alert and Oriented with difficulty hearing Extremity - Neurovascular intact Dorsiflexion/Plantar flexion intact Compartment soft Dressing/Incision - clean, dry, no drainage Motor Function - intact, moving foot and toes well on exam.   Past Medical History:  Diagnosis Date   Anemia    Basal cell carcinoma    Benign neoplasm of colon    Diverticulosis    Hemorrhage of rectum and anus    Hx of adenomatous colonic polyps    Hypertension    Inflammatory arthritis    Inflammatory polyarthropathy (HCC)    Mitral insufficiency    Psoriatic arthropathy (HCC)    Senile  osteoporosis     Assessment/Plan: 1 Day Post-Op Procedure(s) (LRB): INTRAMEDULLARY (IM) NAIL INTERTROCHANTERIC (Right) Principal Problem:   Closed right hip fracture, initial encounter [facility]) Active Problems:   Hypertensive urgency   Closed fracture dislocation of right shoulder  Estimated body mass index is 24.22 kg/m as calculated from the following:   Height as of this encounter: 5' (1.524 m).   Weight as of this encounter: 56.2 kg. Advance diet Up with therapy  DVT Prophylaxis - Lovenox, Foot Pumps, and TED hose Weight-Bearing as tolerated to right leg.  She is to stay in the sling on the right arm.  Reche Dixon, PA-C Orthopaedic Surgery 02/08/2022, 7:50 AM

## 2022-02-08 NOTE — Discharge Instructions (Signed)
INSTRUCTIONS AFTER Surgery  Remove items at home which could result in a fall. This includes throw rugs or furniture in walking pathways ICE to the affected joint every three hours while awake for 30 minutes at a time, for at least the first 3-5 days, and then as needed for pain and swelling.  Continue to use ice for pain and swelling. You may notice swelling that will progress down to the foot and ankle.  This is normal after surgery.  Elevate your leg when you are not up walking on it.   Continue to use the breathing machine you got in the hospital (incentive spirometer) which will help keep your temperature down.  It is common for your temperature to cycle up and down following surgery, especially at night when you are not up moving around and exerting yourself.  The breathing machine keeps your lungs expanded and your temperature down.   DIET:  As you were doing prior to hospitalization, we recommend a well-balanced diet.  DRESSING / WOUND CARE / SHOWERING  Dressing change as needed.  No showering.  Staples will be removed in 2 weeks at [facility] clinic orthopedics  ACTIVITY  Increase activity slowly as tolerated, but follow the weight bearing instructions below.   No driving for 6 weeks or until further direction given by your physician.  You cannot drive while taking narcotics.  No lifting or carrying greater than 10 lbs. until further directed by your surgeon. Avoid periods of inactivity such as sitting longer than an hour when not asleep. This helps prevent blood clots.  You may return to work once you are authorized by your doctor.     WEIGHT BEARING  Weightbearing as tolerated on the right lower extremity.  She will be in a sling on the right upper extremity and can weight-bear through her forearm and shoulder when ambulating.   EXERCISES Gait training and ambulation training.  Transfers.  CONSTIPATION  Constipation is defined medically as fewer than three stools per week and  severe constipation as less than one stool per week.  Even if you have a regular bowel pattern at home, your normal regimen is likely to be disrupted due to multiple reasons following surgery.  Combination of anesthesia, postoperative narcotics, change in appetite and fluid intake all can affect your bowels.   YOU MUST use at least one of the following options; they are listed in order of increasing strength to get the job done.  They are all available over the counter, and you may need to use some, POSSIBLY even all of these options:    Drink plenty of fluids (prune juice may be helpful) and high fiber foods Colace 100 mg by mouth twice a day  Senokot for constipation as directed and as needed Dulcolax (bisacodyl), take with full glass of water  Miralax (polyethylene glycol) once or twice a day as needed.  If you have tried all these things and are unable to have a bowel movement in the first 3-4 days after surgery call either your surgeon or your primary doctor.    If you experience loose stools or diarrhea, hold the medications until you stool forms back up.  If your symptoms do not get better within 1 week or if they get worse, check with your doctor.  If you experience "the worst abdominal pain ever" or develop nausea or vomiting, please contact the office immediately for further recommendations for treatment.   ITCHING:  If you experience itching with your medications, try  taking only a single pain pill, or even half a pain pill at a time.  You can also use Benadryl over the counter for itching or also to help with sleep.   TED HOSE STOCKINGS:  Use stockings on both legs until for at least 2 weeks or as directed by physician office. They may be removed at night for sleeping.  MEDICATIONS:  See your medication summary on the "After Visit Summary" that nursing will review with you.  You may have some home medications which will be placed on hold until you complete the course of blood thinner  medication.  It is important for you to complete the blood thinner medication as prescribed.  PRECAUTIONS:  If you experience chest pain or shortness of breath - call 911 immediately for transfer to the hospital emergency department.   If you develop a fever greater that 101 F, purulent drainage from wound, increased redness or drainage from wound, foul odor from the wound/dressing, or calf pain - CONTACT YOUR SURGEON.                                                   FOLLOW-UP APPOINTMENTS:  If you do not already have a post-op appointment, please call the office for an appointment to be seen by your surgeon.  Guidelines for how soon to be seen are listed in your "After Visit Summary", but are typically between 1-4 weeks after surgery.  OTHER INSTRUCTIONS:     MAKE SURE YOU:  Understand these instructions.  Get help right away if you are not doing well or get worse.    Thank you for letting us be a part of your medical care team.  It is a privilege we respect greatly.  We hope these instructions will help you stay on track for a fast and full recovery!

## 2022-02-08 NOTE — Progress Notes (Signed)
[facility] Progress Note  Patient: Lisa Koch    KDX:[id number]  DOA: 02/06/2022     Date of Service: the patient was seen and examined on 02/08/2022  Chief Complaint  Patient presents with   Fall   Brief hospital course: Lisa Koch is a 88 y.o. female with medical history significant for hypertension, GERD, rheumatoid arthritis, and moderate tricuspid regurgitation who presents to the emergency department with right hip pain and deformity after a fall.  Patient was found to have right hip fracture.  Orthopedics was consulted and further management as below.   ED Course: Upon arrival to the ED, patient is found to be afebrile and saturating mid 90s on room air with severely elevated blood pressure.  EKG demonstrates sinus rhythm with PAC.  Chest x-ray negative for acute cardiopulmonary disease.  Head CT and cervical spine CT are negative for acute findings.  Plain radiographs of the hip demonstrate acute right intertrochanteric femur fracture.  Radiographs of the right shoulder are concerning for anterior dislocation and fracture of the greater tuberosity of the right femoral head.   Orthopedic surgery was consulted by the ED physician, type and screen was performed, and the patient was treated with fentanyl and Zofran.   Assessment and Plan: # Right hip fracture  Based on the available data, Lisa Koch presents an estimated 1.6% risk of perioperative MI or cardiac arrest; no further cardiac evaluation indicated prior to surgery  Orthopedic consulted, s/p Intramedullary nailing of Right femur with cephalomedullary device Closed management of R greater tuberosity fracture Resumed aspirin after surgery continue pain-control,  Continue gentle IVF hydration      #  Anterior dislocation right shoulder; fracture of greater tuberosity of right humerus  - s/p closed reduction done in ED with follow-up imaging and orthopedic surgery consultation  Continue right arm sling  #  Hypertensive urgency  - BP severely elevated in ED in setting of pain and anxiety regarding her current situation  - No evidence for end-organ injury  - Continue pain-control,  continue verapamil, and lisinopril use labetalol if needed    Body mass index is 24.22 kg/m.  Interventions:     Diet: Regular diet DVT Prophylaxis: Subcutaneous Lovenox   Advance goals of care discussion: DNR  Family Communication: family was present at bedside, at the time of interview.  The pt provided permission to discuss medical plan with the family. Opportunity was given to ask question and all questions were answered satisfactorily.   Disposition:  Pt is from Home, admitted with fall and right hip fracture, s/p ORIF and PT/OT eval done recommended SNF placement. Discharge to SNF, when bed will be available.    Subjective: No significant overnight events.  Patient was sitting in the recliner, pain is 5/10, no any other active issues.  Physical Exam: General:  alert, NAD.  Appear in mild distress, affect appropriate Eyes: PERRLA ENT: Oral Mucosa Clear, moist hard of hearing Neck: no JVD,  Cardiovascular: S1 and S2 Present, no Murmur,  Respiratory: good respiratory effort, Bilateral Air entry equal and Decreased, no Crackles, no wheezes Abdomen: Bowel Sound present, Soft and no tenderness,  Skin: no rashes Extremities: no Pedal edema, no calf tenderness Neurologic: without any new focal findings Gait not checked due to patient safety concerns  Vitals:   02/07/22 2123 02/07/22 2247 02/08/22 0355 02/08/22 0753  BP: (!) 140/63 128/73 123/76 (!) 129/59  Pulse: (!) 104 (!) 101 95 81  Resp: '17 18 17 16  '$ Temp: 98.3  F (36.8 C) 98.2 F (36.8 C) 98 F (36.7 C) 98.2 F (36.8 C)  TempSrc:      SpO2: 96% 97% 98% 95%  Weight:      Height:        Intake/Output Summary (Last 24 hours) at 02/08/2022 1337 Last data filed at 02/08/2022 1202 Gross per 24 hour  Intake 579.69 ml  Output 400 ml  Net  179.69 ml   Filed Weights   02/06/22 2301  Weight: 56.2 kg    Data Reviewed: I have personally reviewed and interpreted daily labs, tele strips, imagings as discussed above. I reviewed all nursing notes, pharmacy notes, vitals, pertinent old records I have discussed plan of care as described above with RN and patient/family.  CBC: Recent Labs  Lab 02/06/22 2249 02/07/22 0429 02/08/22 0800  WBC 12.0* 21.2* 14.8*  NEUTROABS 8.1*  --   --   HGB 12.5 11.3* 7.0*  HCT 37.9 33.9* 20.9*  MCV 99.0 99.7 98.6  PLT 248 212 492*   Basic Metabolic Panel: Recent Labs  Lab 02/06/22 2249 02/07/22 0428 02/07/22 0429 02/08/22 0508  NA 137  --  136 134*  K 3.5  --  4.6 4.2  CL 104  --  105 107  CO2 23  --  23 23  GLUCOSE 109*  --  152* 113*  BUN 21  --  18 18  CREATININE 0.74  --  0.63 0.73  CALCIUM 9.5  --  8.8* 7.6*  MG  --   --  1.7 1.8  PHOS  --  3.1  --  2.6    Studies: No results found.  Scheduled Meds:  acetaminophen  1,000 mg Oral Q8H   bacitracin   Topical Once   docusate sodium  100 mg Oral BID   enoxaparin (LOVENOX) injection  40 mg Subcutaneous Q24H   feeding supplement  237 mL Oral BID BM   folic acid  2 mg Oral Daily   lisinopril  40 mg Oral Daily   methotrexate  10 mg Oral Q Sun   multivitamin with minerals  1 tablet Oral Daily   pantoprazole  40 mg Oral Daily   potassium chloride SA  20 mEq Oral Daily   verapamil  240 mg Oral Daily   Continuous Infusions:  sodium chloride 75 mL/hr at 02/07/22 2114   methocarbamol (ROBAXIN) IV     PRN Meds: acetaminophen, bisacodyl, HYDROmorphone (DILAUDID) injection, labetalol, menthol-cetylpyridinium, methocarbamol **OR** methocarbamol (ROBAXIN) IV, metoCLOPramide **OR** metoCLOPramide (REGLAN) injection, ondansetron **OR** ondansetron (ZOFRAN) IV, oxyCODONE, oxyCODONE, senna-docusate, sodium phosphate, traMADol  Time spent: 35 minutes  Author: Val Koch. MD [facility] 02/08/2022 1:37 PM  To reach  On-call, see care teams to locate the attending and reach out to them via [email or web address]. If 7PM-7AM, please contact night-coverage If you still have difficulty reaching the attending provider, please page the [facility] (Director on Call) for [facility] on amion for assistance.

## 2022-02-08 NOTE — Evaluation (Addendum)
Physical Therapy Evaluation Patient Details Name: Lisa Koch MRN: [id number] DOB: [birth year] Today's Date: 02/08/2022  History of Present Illness  Pt is a 88 y/o F admitted on 02/06/22 after presenting to the ED with R hip pain & deformity following a fall. Pt found to have R hip fx. Pt underwent R IM nai placement. Pt also found to have anterior dislocation of R shoulder & fx of greater tuberosity of R humerus. Pt underwent closed reduction in the ED. PMH: HTN, GERD, RA. Per secure chat with Dr. Posey Pronto on 02/08/22, he cleared pt to use RW for mobility as long as RUE remained close to her side to minimize ROM.  Clinical Impression  Pt seen for PT evaluation with co-tx with OT for pt safety. Pt's daughter in law Lisa Koch) present reporting prior to fall in October pt was living alone & independent, following fall pt was living with a son & was ambulating with RW but almost back to independence. On this date, pt requires +2 for STS from recliner & mod assist +2 HHA to ambulate short distance in room. (Utilized BUE HHA vs R PFRW as PT unable to locate PFRW attachment at this time.) During gait pt demonstrates decreased weight shift to RLE & decreased step length LLE. Currently recommending STR upon d/c to maximize independence with functional mobility, decrease caregiver burden, & reduce fall risk prior to return home.    Recommendations for follow up therapy are one component of a multi-disciplinary discharge planning process, led by the attending physician.  Recommendations may be updated based on patient status, additional functional criteria and insurance authorization.  Follow Up Recommendations Skilled nursing-short term rehab (<3 hours/day) Can patient physically be transported by private vehicle: No    Assistance Recommended at Discharge Frequent or constant Supervision/Assistance  Patient can return home with the following  Two people to help with walking and/or transfers;Two people to  help with bathing/dressing/bathroom;Help with stairs or ramp for entrance;Assistance with feeding;Assistance with cooking/housework;Direct supervision/assist for financial management;Assist for transportation    Equipment Recommendations None recommended by PT (TBD in next veue)  Recommendations for Other Services       Functional Status Assessment Patient has had a recent decline in their functional status and demonstrates the ability to make significant improvements in function in a reasonable and predictable amount of time.     Precautions / Restrictions Precautions Precautions: Fall Required Braces or Orthoses: Sling (RUE) Restrictions Weight Bearing Restrictions: Yes RUE Weight Bearing: Non weight bearing (Per ortho note, can weight bear through elbow/shoulder with PFRW to allow pt to mobilize. No ROM. Per secure chat with Dr. Posey Pronto, he cleared pt to use RW for mobility as long as RUE remained close to her side to minimize ROM.) RLE Weight Bearing: Weight bearing as tolerated Other Position/Activity Restrictions: TLSO for comfort PRN 2/2 T10 compression fx in October      Mobility  Bed Mobility               General bed mobility comments: not observed, pt received standing in room with nurse, left in recliner    Transfers Overall transfer level: Needs assistance Equipment used: 2 person hand held assist Transfers: Sit to/from Stand Sit to Stand: Min assist, Mod assist           General transfer comment: STS from recliner    Ambulation/Gait Ambulation/Gait assistance: Mod assist Gait Distance (Feet): 7 Feet Assistive device: 2 person hand held assist Gait Pattern/deviations: Decreased stance time -  right, Decreased dorsiflexion - right, Decreased dorsiflexion - left, Decreased step length - left, Decreased step length - right, Decreased weight shift to right Gait velocity: decreased     General Gait Details: Decreased weight shift to RLE, decreased step  length LLE  Stairs            Wheelchair Mobility    Modified Rankin (Stroke Patients Only)       Balance Overall balance assessment: Needs assistance Sitting-balance support: Feet supported, Bilateral upper extremity supported Sitting balance-Leahy Scale: Fair     Standing balance support: Bilateral upper extremity supported, During functional activity Standing balance-Leahy Scale: Poor                               Pertinent Vitals/Pain Pain Assessment Pain Assessment: Faces Faces Pain Scale: Hurts little more Pain Location: RLE with mobility Pain Descriptors / Indicators: Discomfort, Grimacing, Guarding Pain Intervention(s): Monitored during session    Home Living Family/patient expects to be discharged to:: Private residence Living Arrangements: Children;Alone Available Help at Discharge: Family;Available 24 hours/day Type of Home: House Home Access: Stairs to enter Entrance Stairs-Rails: None Entrance Stairs-Number of Steps: 1 at front   Home Layout: One level   Additional Comments: Prior to fall in October pt was living alone. Following that fall pt was living with a son in a 1 level home with 1 step to enter at front, 4 steps with R rail at other entrance. Pt had been using RW but was returning to independent level prior to this fall.    Prior Function Prior Level of Function : Other (comment);Needs assist (Prior to October, pt was driving. Now requires IADL assistance.)       Physical Assist : Mobility (physical);ADLs (physical) Mobility (physical): Transfers;Gait ADLs (physical): Feeding;Grooming;Bathing;Dressing;Toileting;IADLs Mobility Comments: Prior to fall in October pt was living alone. Following that fall pt was living with a son in a 1 level home with 1 step to enter at front, 4 steps with R rail at other entrance. Pt had been using RW but was returning to independent level prior to this fall.       Hand Dominance   Dominant  Hand: Right    Extremity/Trunk Assessment   Upper Extremity Assessment Upper Extremity Assessment: RUE deficits/detail (RUE in sling)    Lower Extremity Assessment Lower Extremity Assessment: Generalized weakness       Communication   Communication: HOH (even with hearing aides)  Cognition Arousal/Alertness: Awake/alert Behavior During Therapy: WFL for tasks assessed/performed Overall Cognitive Status: Within Functional Limits for tasks assessed                                          General Comments      Exercises     Assessment/Plan    PT Assessment Patient needs continued PT services  PT Problem List Decreased strength;Decreased coordination;Pain;Decreased range of motion;Decreased balance;Decreased activity tolerance;Decreased knowledge of use of DME;Decreased mobility;Decreased knowledge of precautions;Decreased safety awareness       PT Treatment Interventions DME instruction;Therapeutic exercise;Gait training;Balance training;Neuromuscular re-education;Stair training;Functional mobility training;Cognitive remediation;Therapeutic activities;Patient/family education    PT Goals (Current goals can be found in the Care Plan section)  Acute Rehab PT Goals Patient Stated Goal: get better PT Goal Formulation: With patient/family Time For Goal Achievement: 02/22/22 Potential to Achieve Goals: Fair  Frequency BID     Co-evaluation PT/OT/SLP Co-Evaluation/Treatment: Yes Reason for Co-Treatment: Complexity of the patient's impairments (multi-system involvement);To address functional/ADL transfers;For patient/therapist safety PT goals addressed during session: Mobility/safety with mobility;Balance OT goals addressed during session: ADL's and self-care       AM-PAC PT "6 Clicks" Mobility  Outcome Measure Help needed turning from your back to your side while in a flat bed without using bedrails?: A Lot Help needed moving from lying on your back  to sitting on the side of a flat bed without using bedrails?: A Lot Help needed moving to and from a bed to a chair (including a wheelchair)?: Total Help needed standing up from a chair using your arms (e.g., wheelchair or bedside chair)?: Total Help needed to walk in hospital room?: Total Help needed climbing 3-5 steps with a railing? : Total 6 Click Score: 8    End of Session Equipment Utilized During Treatment:  (RUE sling) Activity Tolerance: Patient tolerated treatment well Patient left: in chair;with family/visitor present;with nursing/sitter in room Nurse Communication: Mobility status PT Visit Diagnosis: Unsteadiness on feet (R26.81);Pain;Other abnormalities of gait and mobility (R26.89);Muscle weakness (generalized) (M62.81);Difficulty in walking, not elsewhere classified (R26.2) Pain - Right/Left: Right Pain - part of body: Hip    Time: 2957-4734 PT Time Calculation (min) (ACUTE ONLY): 13 min   Charges:   PT Evaluation $PT Eval Moderate Complexity: [place], PT, DPT 02/08/22, 10:17 AM   Waunita Schooner 02/08/2022, 10:13 AM

## 2022-02-08 NOTE — Evaluation (Signed)
Occupational Therapy Evaluation Patient Details Name: Lisa Koch MRN: [id number] DOB: 11-20-1924 Today's Date: 02/08/2022   History of Present Illness Pt is a 88 y/o F admitted on 02/06/22 after presenting to the ED with R hip pain & deformity following a fall. Pt found to have R hip fx. Pt underwent R IM nai placement. Pt also found to have anterior dislocation of R shoulder & fx of greater tuberosity of R humerus. Pt underwent closed reduction in the ED. PMH: HTN, GERD, RA. Per secure chat with Dr. Posey Pronto on 02/08/22, he cleared pt to use RW for mobility as long as RUE remained close to her side to minimize ROM.   Clinical Impression   Patient received for OT evaluation. See flowsheet below for details of function. Note significant instructions of MD for weightbearing of RUE during mobility (below). Generally, patient requiring handheld assist x2 for functional mobility, and MIN-MAX A for ADLs. Patient will benefit from continued OT while in acute care.       Recommendations for follow up therapy are one component of a multi-disciplinary discharge planning process, led by the attending physician.  Recommendations may be updated based on patient status, additional functional criteria and insurance authorization.   Follow Up Recommendations  Skilled nursing-short term rehab (<3 hours/day)     Assistance Recommended at Discharge Frequent or constant Supervision/Assistance  Patient can return home with the following Two people to help with walking and/or transfers;A lot of help with bathing/dressing/bathroom;Assistance with cooking/housework;Assistance with feeding;Direct supervision/assist for medications management;Direct supervision/assist for financial management;Assist for transportation;Help with stairs or ramp for entrance    Functional Status Assessment  Patient has had a recent decline in their functional status and demonstrates the ability to make significant improvements in  function in a reasonable and predictable amount of time.  Equipment Recommendations  Other (comment) (defer to next level of care)    Recommendations for Other Services       Precautions / Restrictions Precautions Precautions: Fall Required Braces or Orthoses: Sling (RUE) Restrictions Weight Bearing Restrictions: Yes RUE Weight Bearing: Non weight bearing (Per ortho note, can weight bear through elbow/shoulder with PFRW to allow pt to mobilize. No ROM. Per secure chat with Dr. Posey Pronto, he cleared pt to use RW for mobility as long as RUE remained close to her side to minimize ROM.) RLE Weight Bearing: Weight bearing as tolerated Other Position/Activity Restrictions: TLSO for comfort PRN 2/2 T10 compression fx in October      Mobility Bed Mobility               General bed mobility comments: Pt received standing in room with RN; pt in front of recliner.    Transfers Overall transfer level: Needs assistance Equipment used: 2 person hand held assist Transfers: Sit to/from Stand Sit to Stand: Mod assist           General transfer comment: Pt with RUE in sling; handheld assist x2; pt holding helpers hands very close to her body, reluctant to push down as if using RW. sit to stand from recliner. Walked in room 5 feet forward, turned around and walked 5 feet back to chair; requiring MOD x2 at all times; unseady on feet.      Balance Overall balance assessment: Needs assistance Sitting-balance support: Feet supported, Bilateral upper extremity supported Sitting balance-Leahy Scale: Fair Sitting balance - Comments: Pt having difficulty leaning forward in chair to scoot forward in preparation for standing.   Standing balance support: Bilateral upper extremity  supported, During functional activity Standing balance-Leahy Scale: Poor                             ADL either performed or assessed with clinical judgement   ADL Overall ADL's : Needs  assistance/impaired Eating/Feeding: Set up;Supervision/ safety Eating/Feeding Details (indicate cue type and reason): anticipated Grooming: Set up;Sitting;Minimal assistance Grooming Details (indicate cue type and reason): anticipated; only able to complete from sitting; unable to RUE with shoulder movement. Upper Body Bathing: Moderate assistance Upper Body Bathing Details (indicate cue type and reason): anticipated Lower Body Bathing: Maximal assistance Lower Body Bathing Details (indicate cue type and reason): anticipated Upper Body Dressing : Moderate assistance Upper Body Dressing Details (indicate cue type and reason): anticipated 2/2 RUE movement precautions Lower Body Dressing: Maximal assistance;Total assistance Lower Body Dressing Details (indicate cue type and reason): required total assist today to doff wet socks and don clean socks; pt attempted but was unable to do any part of the task Toilet Transfer: Moderate assistance;+2 for physical assistance;Stand-pivot Toilet Transfer Details (indicate cue type and reason): anticipated Toileting- Clothing Manipulation and Hygiene: Maximal assistance Toileting - Clothing Manipulation Details (indicate cue type and reason): anticipated Tub/ Shower Transfer:  (not safe for showering at this time; recommend sponge bathing)   Functional mobility during ADLs: +2 for physical assistance       Vision Baseline Vision/History: 1 Wears glasses       Perception     Praxis      Pertinent Vitals/Pain Pain Assessment Pain Assessment: 0-10 Pain Score: 5  Pain Location: R hip Pain Descriptors / Indicators: Discomfort Pain Intervention(s): Premedicated before session, Repositioned, Other (comment), Monitored during session, Limited activity within patient's tolerance (RN in room to give more pain medication)     Hand Dominance Right   Extremity/Trunk Assessment Upper Extremity Assessment Upper Extremity Assessment: RUE deficits/detail  (RUE in sling) RUE Deficits / Details: RUE in sling; R shoulder fx; was dislocated and was reduced in ED. No shoulder surgery at this time. MD order states to keep in sling at all times, no ROM. RUE: Unable to fully assess due to immobilization   Lower Extremity Assessment Lower Extremity Assessment: Generalized weakness       Communication Communication Communication: HOH (even with hearing aides)   Cognition Arousal/Alertness: Awake/alert Behavior During Therapy: WFL for tasks assessed/performed Overall Cognitive Status: Within Functional Limits for tasks assessed                                 General Comments: Had to use gestures a lot, as pt is extremely HOH. Hard to assess full cognitive functioning due to hearing impairment. Pt coughing during session, so OT did not remove OT's mask for safety, although pt would likely have easier time communicating if she could see mouth of the speaker.     General Comments       Exercises     Shoulder Instructions      Home Living Family/patient expects to be discharged to:: Private residence Living Arrangements: Children;Alone Available Help at Discharge: Family;Available 24 hours/day Type of Home: House Home Access: Stairs to enter [employer] of Steps: 1 at front Entrance Stairs-Rails: None Home Layout: One level     Bathroom Shower/Tub: Occupational psychologist: Handicapped height         Additional Comments: Prior to fall in October  pt was living alone. Following that fall pt was living with a son in a 1 level home with 1 step to enter at front, 4 steps with R rail at other entrance. Pt had been using RW but was returning to independent level prior to this fall.      Prior Functioning/Environment Prior Level of Function : Other (comment);Needs assist (Prior to October, pt was driving. Now requires IADL assistance.)       Physical Assist : Mobility (physical);ADLs (physical) Mobility  (physical): Transfers;Gait ADLs (physical): Feeding;Grooming;Bathing;Dressing;Toileting;IADLs Mobility Comments: Prior to fall in October pt was living alone. Following that fall pt was living with a son in a 1 level home with 1 step to enter at front, 4 steps with R rail at other entrance. Pt had been using RW but was returning to independent level prior to this fall. ADLs Comments: Had returned to (I) level of ADLs with RW prior to this recent fall. Was preparing to return to her home with family IADL assist.        OT Problem List: Decreased strength;Decreased range of motion;Decreased activity tolerance;Impaired balance (sitting and/or standing);Decreased coordination      OT Treatment/Interventions: Self-care/ADL training;Therapeutic exercise;Therapeutic activities;Patient/family education    OT Goals(Current goals can be found in the care plan section) Acute Rehab OT Goals Patient Stated Goal: Get better; pain to stop OT Goal Formulation: With patient/family Time For Goal Achievement: 02/22/22 Potential to Achieve Goals: Fair ADL Goals Pt Will Perform Grooming: with modified independence;standing Pt Will Perform Upper Body Dressing: with modified independence;sitting Pt Will Perform Lower Body Dressing: with modified independence;sit to/from stand Pt Will Transfer to Toilet: with modified independence;ambulating;grab bars Pt Will Perform Toileting - Clothing Manipulation and hygiene: with modified independence;sit to/from stand  OT Frequency: Min 2X/week    Co-evaluation PT/OT/SLP Co-Evaluation/Treatment: Yes Reason for Co-Treatment: Complexity of the patient's impairments (multi-system involvement);To address functional/ADL transfers;For patient/therapist safety PT goals addressed during session: Mobility/safety with mobility;Balance OT goals addressed during session: ADL's and self-care      AM-PAC OT "6 Clicks" Daily Activity     Outcome Measure Help from another person  eating meals?: A Little Help from another person taking care of personal grooming?: A Little Help from another person toileting, which includes using toliet, bedpan, or urinal?: A Lot Help from another person bathing (including washing, rinsing, drying)?: A Lot Help from another person to put on and taking off regular upper body clothing?: A Lot Help from another person to put on and taking off regular lower body clothing?: Total 6 Click Score: 13   End of Session Nurse Communication: Mobility status;Precautions  Activity Tolerance: Patient limited by pain;Patient limited by fatigue Patient left: in chair;with nursing/sitter in room;with family/visitor present  OT Visit Diagnosis: Unsteadiness on feet (R26.81);Muscle weakness (generalized) (M62.81);History of falling (Z91.81)                Time: 8101-7510 OT Time Calculation (min): 23 min Charges:  OT General Charges $OT Visit: 1 Visit OT Evaluation $OT Eval Moderate Complexity: 1 Mod  [place], MS, OTR/L   Vania Rea 02/08/2022, 11:32 AM

## 2022-02-09 ENCOUNTER — Encounter: Payer: Self-pay | Admitting: Orthopedic Surgery

## 2022-02-09 DIAGNOSIS — S72001A Fracture of unspecified part of neck of right femur, initial encounter for closed fracture: Secondary | ICD-10-CM | POA: Diagnosis not present

## 2022-02-09 DIAGNOSIS — D62 Acute posthemorrhagic anemia: Secondary | ICD-10-CM | POA: Insufficient documentation

## 2022-02-09 LAB — IRON AND TIBC
Iron: 112 ug/dL (ref 28–170)
Saturation Ratios: 54 % — ABNORMAL HIGH (ref 10.4–31.8)
TIBC: 207 ug/dL — ABNORMAL LOW (ref 250–450)
UIBC: 95 ug/dL

## 2022-02-09 LAB — ABO/RH: ABO/RH(D): O POS

## 2022-02-09 LAB — RETICULOCYTES
Immature Retic Fract: 16.8 % — ABNORMAL HIGH (ref 2.3–15.9)
RBC.: 2.63 MIL/uL — ABNORMAL LOW (ref 3.87–5.11)
Retic Count, Absolute: 60.5 10*3/uL (ref 19.0–186.0)
Retic Ct Pct: 2.3 % (ref 0.4–3.1)

## 2022-02-09 LAB — BASIC METABOLIC PANEL
Anion gap: 4 — ABNORMAL LOW (ref 5–15)
BUN: 26 mg/dL — ABNORMAL HIGH (ref 8–23)
CO2: 22 mmol/L (ref 22–32)
Calcium: 8.1 mg/dL — ABNORMAL LOW (ref 8.9–10.3)
Chloride: 111 mmol/L (ref 98–111)
Creatinine, Ser: 0.87 mg/dL (ref 0.44–1.00)
GFR, Estimated: >60 mL/min (ref >60)
Glucose, Bld: 112 mg/dL — ABNORMAL HIGH (ref 70–99)
Potassium: 4.1 mmol/L (ref 3.5–5.1)
Sodium: 137 mmol/L (ref 135–145)

## 2022-02-09 LAB — CBC
HCT: 20.2 % — ABNORMAL LOW (ref 36.0–46.0)
HCT: 25.7 % — ABNORMAL LOW (ref 36.0–46.0)
Hemoglobin: 6.7 g/dL — ABNORMAL LOW (ref 12.0–15.0)
Hemoglobin: 8.5 g/dL — ABNORMAL LOW (ref 12.0–15.0)
MCH: 33.2 pg (ref 26.0–34.0)
MCH: 32.3 pg (ref 26.0–34.0)
MCHC: 33.2 g/dL (ref 30.0–36.0)
MCHC: 33.1 g/dL (ref 30.0–36.0)
MCV: 100 fL (ref 80.0–100.0)
MCV: 97.7 fL (ref 80.0–100.0)
Platelets: 159 10*3/uL (ref 150–400)
Platelets: 169 10*3/uL (ref 150–400)
RBC: 2.02 MIL/uL — ABNORMAL LOW (ref 3.87–5.11)
RBC: 2.63 MIL/uL — ABNORMAL LOW (ref 3.87–5.11)
RDW: 14.2 % (ref 11.5–15.5)
RDW: 14.9 % (ref 11.5–15.5)
WBC: 11.4 10*3/uL — ABNORMAL HIGH (ref 4.0–10.5)
WBC: 11.3 10*3/uL — ABNORMAL HIGH (ref 4.0–10.5)
nRBC: 0 % (ref 0.0–0.2)
nRBC: 0 % (ref 0.0–0.2)

## 2022-02-09 LAB — PREPARE RBC (CROSSMATCH)

## 2022-02-09 LAB — VITAMIN B12: Vitamin B-12: 675 pg/mL (ref 180–914)

## 2022-02-09 LAB — PHOSPHORUS: Phosphorus: 2.6 mg/dL (ref 2.5–4.6)

## 2022-02-09 LAB — MAGNESIUM: Magnesium: 2.2 mg/dL (ref 1.7–2.4)

## 2022-02-09 LAB — FERRITIN: Ferritin: 58 ng/mL (ref 11–307)

## 2022-02-09 LAB — FOLATE: Folate: >40 ng/mL (ref >5.9)

## 2022-02-09 MED ORDER — SODIUM CHLORIDE 0.9% IV SOLUTION: Freq: Once | INTRAVENOUS | Status: AC

## 2022-02-09 NOTE — NC FL2 (Signed)
[place] LEVEL OF CARE SCREENING TOOL     IDENTIFICATION  Patient Name: Lisa Koch Birthdate: November 11, 1924 Sex: female Admission Date (Current Location): 02/06/2022  [facility] and [place] Number:  Engineering geologist and Address:  [facility], [address] [place], [place] [address]      Provider Number: [id number]  Attending Physician Name and Address:  Lorella Nimrod, MD  Relative Name and Phone Number:  Netta Cedars [phone]    Current Level of Care: Hospital Recommended Level of Care: [place] Prior Approval Number:    Date Approved/Denied:   PASRR Number: [id number]  Discharge Plan: SNF    Current Diagnoses: Patient Active Problem List   Diagnosis Date Noted   Closed right hip fracture, initial encounter ([place]) 02/07/2022   Closed fracture dislocation of right shoulder 02/07/2022   Cholelithiasis 12/16/2021   Elevated bilirubin 12/16/2021   Back pain secondary to closed wedge compression fracture of tenth thoracic vertebra ([place]) 12/16/2021   Recent fall with LOC 12/14/21 07/02/2021   Hypertensive urgency 08/18/2013    Orientation RESPIRATION BLADDER Height & Weight     Self, Time, Situation, Place  Normal Continent, External catheter Weight: 56.2 kg Height:  5' (152.4 cm)  BEHAVIORAL SYMPTOMS/MOOD NEUROLOGICAL BOWEL NUTRITION STATUS        Diet (see dc summary)  AMBULATORY STATUS COMMUNICATION OF NEEDS Skin   Extensive Assist Verbally Normal, Surgical wounds                       Personal Care Assistance Level of Assistance  Bathing, Feeding, Dressing Bathing Assistance: Maximum assistance Feeding assistance: Limited assistance Dressing Assistance: Maximum assistance     Functional Limitations Info  Hearing, Sight, Speech Sight Info: Adequate Hearing Info: Impaired Speech Info: Adequate    SPECIAL CARE FACTORS FREQUENCY  PT (By licensed PT), OT (By licensed OT)     PT Frequency: 5  times per week OT Frequency: 5 times per week            Contractures Contractures Info: Not present    Additional Factors Info  Code Status Code Status Info: DNR             Current Medications (02/09/2022):  This is the current hospital active medication list Current Facility-Administered Medications  Medication Dose Route Frequency Provider Last Rate Last Admin   0.9 %  sodium chloride infusion   Intravenous Continuous Leim Fabry, MD 75 mL/hr at 02/07/22 2114 New Bag at 02/07/22 2114   acetaminophen (TYLENOL) tablet 1,000 mg  1,000 mg Oral Q8H Leim Fabry, MD   1,000 mg at 02/09/22 0636   acetaminophen (TYLENOL) tablet 325-650 mg  325-650 mg Oral Q6H PRN Leim Fabry, MD       aspirin EC tablet 81 mg  81 mg Oral Daily Val Riles, MD   81 mg at 02/09/22 0845   bacitracin ointment   Topical Once Opyd, Ilene Qua, MD       bisacodyl (DULCOLAX) suppository 10 mg  10 mg Rectal Daily PRN Leim Fabry, MD   10 mg at 02/09/22 0636   docusate sodium (COLACE) capsule 100 mg  100 mg Oral BID Leim Fabry, MD   100 mg at 02/09/22 0845   enoxaparin (LOVENOX) injection 40 mg  40 mg Subcutaneous Q24H Leim Fabry, MD   40 mg at 02/09/22 0844   feeding supplement (ENSURE ENLIVE / ENSURE PLUS) liquid 237 mL  237 mL Oral BID  BM Val Riles, MD   237 mL at 84/13/24 4010   folic acid (FOLVITE) tablet 2 mg  2 mg Oral Daily Leim Fabry, MD   2 mg at 02/09/22 0845   HYDROmorphone (DILAUDID) injection 0.2-0.4 mg  0.2-0.4 mg Intravenous Q4H PRN Leim Fabry, MD       labetalol (NORMODYNE) injection 10 mg  10 mg Intravenous Q2H PRN Opyd, Ilene Qua, MD       lisinopril (ZESTRIL) tablet 40 mg  40 mg Oral Daily Leim Fabry, MD   40 mg at 02/09/22 0844   menthol-cetylpyridinium (CEPACOL) lozenge 3 mg  1 lozenge Oral PRN Leim Fabry, MD   3 mg at 02/07/22 2109   methocarbamol (ROBAXIN) tablet 500 mg  500 mg Oral Q6H PRN Leim Fabry, MD       Or   methocarbamol (ROBAXIN) 500 mg in dextrose 5 % 50  mL IVPB  500 mg Intravenous Q6H PRN Leim Fabry, MD       methotrexate (RHEUMATREX) tablet 10 mg  10 mg Oral Q Renea Ee, Tarry Kos, MD   10 mg at 02/08/22 0949   metoCLOPramide (REGLAN) tablet 5-10 mg  5-10 mg Oral Q8H PRN Leim Fabry, MD       Or   metoCLOPramide (REGLAN) injection 5-10 mg  5-10 mg Intravenous Q8H PRN Leim Fabry, MD       multivitamin with minerals tablet 1 tablet  1 tablet Oral Daily Leim Fabry, MD   1 tablet at 02/09/22 0846   ondansetron (ZOFRAN) tablet 4 mg  4 mg Oral Q6H PRN Leim Fabry, MD       Or   ondansetron [facility]) injection 4 mg  4 mg Intravenous Q6H PRN Leim Fabry, MD       oxyCODONE (Oxy IR/ROXICODONE) immediate release tablet 2.5-5 mg  2.5-5 mg Oral Q4H PRN Leim Fabry, MD   5 mg at 02/08/22 2725   oxyCODONE (Oxy IR/ROXICODONE) immediate release tablet 5-10 mg  5-10 mg Oral Q4H PRN Leim Fabry, MD   5 mg at 02/09/22 0845   pantoprazole (PROTONIX) EC tablet 40 mg  40 mg Oral Daily Opyd, Ilene Qua, MD   40 mg at 02/09/22 0845   potassium chloride SA (KLOR-CON M) CR tablet 20 mEq  20 mEq Oral Daily Leim Fabry, MD   20 mEq at 02/09/22 0845   senna-docusate (Senokot-S) tablet 1 tablet  1 tablet Oral QHS PRN Leim Fabry, MD       senna-docusate (Senokot-S) tablet 2 tablet  2 tablet Oral BID Val Riles, MD   2 tablet at 02/09/22 0844   sodium phosphate (FLEET) 7-19 GM/118ML enema 1 enema  1 enema Rectal Once PRN Leim Fabry, MD       traMADol [facility]at[facility]) tablet 50 mg  50 mg Oral Q6H PRN Leim Fabry, MD       verapamil (CALAN-SR) CR tablet 240 mg  240 mg Oral Daily Opyd, Ilene Qua, MD   240 mg at 02/09/22 0845     Discharge Medications: Please see discharge summary for a list of discharge medications.  Relevant Imaging Results:  Relevant Lab Results:   Additional Information SS# [id number]  Conception Oms, RN

## 2022-02-09 NOTE — TOC Progression Note (Signed)
Transition of Care [facility]) - Progression Note    Patient Details  Name: Lisa Koch MRN: [id number] Date of Birth: 05-15-1924  Transition of Care [facility]) CM/SW [place], RN Phone Number: 02/09/2022, 1:29 PM  Clinical Narrative:    [facility] has made a bed offer, I spoke with the Netta Cedars they accepted the bed offer, once medically clear will get Ins started   Expected Discharge Plan: [place] Barriers to Discharge: SNF Pending bed offer  Expected Discharge Plan and Services Expected Discharge Plan: [place]                                               Social Determinants of Health (SDOH) Interventions    Readmission Risk Interventions     No data to display

## 2022-02-09 NOTE — Progress Notes (Signed)
  Subjective: 2 Days Post-Op Procedure(s) (LRB): INTRAMEDULLARY (IM) NAIL INTERTROCHANTERIC (Right) Status post closed reduction anterior right shoulder dislocation with greater tuberosity fracture Patient reports pain as mild.   Patient is well, and has had no acute complaints or problems Plan is to go Skilled nursing facility after hospital stay. Negative for chest pain and shortness of breath Fever: no Gastrointestinal: Negative for nausea and vomiting  Objective: Vital signs in last 24 hours: Temp:  [97.8 F (36.6 C)-100 F (37.8 C)] 97.8 F (36.6 C) (11/27 0518) Pulse Rate:  [81-96] 89 (11/27 0518) Resp:  [16-20] 18 (11/27 0518) BP: (101-129)/(41-72) 114/50 (11/27 0518) SpO2:  [93 %-95 %] 93 % (11/27 0518)  Intake/Output from previous day:  Intake/Output Summary (Last 24 hours) at 02/09/2022 0711 Last data filed at 02/08/2022 2040 Gross per 24 hour  Intake 240 ml  Output 400 ml  Net -160 ml    Intake/Output this shift: No intake/output data recorded.  Labs: Recent Labs    02/06/22 2249 02/07/22 0429 02/08/22 0800 02/09/22 0523  HGB 12.5 11.3* 7.0* 6.7*   Recent Labs    02/08/22 0800 02/09/22 0523  WBC 14.8* 11.4*  RBC 2.12* 2.02*  HCT 20.9* 20.2*  PLT 144* 159   Recent Labs    02/08/22 0508 02/09/22 0523  NA 134* 137  K 4.2 4.1  CL 107 111  CO2 23 22  BUN 18 26*  CREATININE 0.73 0.87  GLUCOSE 113* 112*  CALCIUM 7.6* 8.1*   Recent Labs    02/06/22 2249  INR 1.0     EXAM General - Patient is Alert and Oriented with difficulty hearing Extremity - Neurovascular intact Dorsiflexion/Plantar flexion intact Compartment soft Dressing/Incision - clean, dry, no drainage Motor Function - intact, moving foot and toes well on exam.   Past Medical History:  Diagnosis Date   Anemia    Basal cell carcinoma    Benign neoplasm of colon    Diverticulosis    Hemorrhage of rectum and anus    Hx of adenomatous colonic polyps    Hypertension     Inflammatory arthritis    Inflammatory polyarthropathy (HCC)    Mitral insufficiency    Psoriatic arthropathy (HCC)    Senile osteoporosis     Assessment/Plan: 2 Days Post-Op Procedure(s) (LRB): INTRAMEDULLARY (IM) NAIL INTERTROCHANTERIC (Right) Principal Problem:   Closed right hip fracture, initial encounter [facility]) Active Problems:   Hypertensive urgency   Closed fracture dislocation of right shoulder  Estimated body mass index is 24.22 kg/m as calculated from the following:   Height as of this encounter: 5' (1.524 m).   Weight as of this encounter: 56.2 kg. Advance diet Up with therapy  DVT Prophylaxis - Lovenox, Foot Pumps, and TED hose Weight-Bearing as tolerated to right leg.  She is to stay in the sling on the right arm.  Reche Dixon, PA-C Orthopaedic Surgery 02/09/2022, 7:11 AM

## 2022-02-09 NOTE — Progress Notes (Signed)
Progress Note   Patient: Lisa Koch OVF:[id number] DOB: Apr 26, 1924 DOA: 02/06/2022     2 DOS: the patient was seen and examined on 02/09/2022   Brief hospital course: Taken from prior notes.  Lisa Koch is a 88 y.o. female with medical history significant for hypertension, GERD, rheumatoid arthritis, and moderate tricuspid regurgitation who presents to the emergency department with right hip pain and deformity after a fall.  Patient was found to have right hip fracture.  Orthopedics was consulted and further management as below.    ED Course: Upon arrival to the ED, patient is found to be afebrile and saturating mid 90s on room air with severely elevated blood pressure.  EKG demonstrates sinus rhythm with PAC.  Chest x-ray negative for acute cardiopulmonary disease.  Head CT and cervical spine CT are negative for acute findings.  Plain radiographs of the hip demonstrate acute right intertrochanteric femur fracture.  Radiographs of the right shoulder are concerning for anterior dislocation and fracture of the greater tuberosity of the right femoral head.   Orthopedic surgery was consulted by the ED physician, type and screen was performed, and the patient was treated with fentanyl and Zofran. S/p ORIF and closed reduction of right shoulder.  11/27: Hemodynamically stable.  Hemoglobin decreased to 6.7 this morning, it was decreased to 7 postoperatively from 11.3.  1 unit of PRBC ordered. PT/OT are recommending SNF-now had a bed offer-pending insurance authorization    Assessment and Plan: * Closed right hip fracture, initial encounter ([place]) S/p ORIF.  Pain seems well-controlled with current regimen. Home aspirin was resumed after surgery. Orthopedic surgery cleared her for discharge as she will be weightbearing on right lower extremity as tolerated. -PT/OT recommending short-term rehab -Continue with supportive care and pain management  Closed fracture dislocation of right  shoulder S/p closed reduction by orthopedic surgery in OR.  Patient was placed in sling. -Continue with sling -Outpatient orthopedic follow-up  Hypertensive urgency Blood pressure significantly elevated on arrival to ED, most likely secondary to pain and anxiety.  No evidence of endorgan injury. Currently seems stable. -Continue with verapamil and lisinopril -Continue with as needed labetalol  Anemia associated with acute blood loss Hemoglobin decreased to 6.7, they decreased to 7 from 11.3 after the surgery. -Check anemia panel -1 unit PRBC -Start her on iron supplement -Continue to monitor -Transfuse if below 7     Subjective: Patient was seen and examined today.  Resting comfortably in bed.  Receiving blood transfusion.  Pain seems well-controlled.  Physical Exam: Vitals:   02/09/22 0813 02/09/22 1023 02/09/22 1040 02/09/22 1419  BP: (!) 130/56 (!) 110/44 (!) 112/48 (!) 123/48  Pulse: 98 86 89 91  Resp: '16 16 16 16  '$ Temp: 97.9 F (36.6 C) 99.4 F (37.4 C) 98.6 F (37 C) 97.8 F (36.6 C)  TempSrc:  Oral Oral Oral  SpO2: 94% 94% 93% 94%  Weight:      Height:       General.  Frail, hard of hearing elderly lady, in no acute distress. Pulmonary.  Lungs clear bilaterally, normal respiratory effort. CV.  Regular rate and rhythm, positive murmur. Abdomen.  Soft, nontender, nondistended, BS positive. CNS.  Alert and oriented .  No focal neurologic deficit. Extremities.  Right upper extremity with sling, bruising and edema noted.  No lower extremity edema bilaterally Psychiatry.  Appears to have some cognitive impairment  Data Reviewed: Prior data reviewed  Family Communication: Discussed with son at bedside  Disposition: Status is:  Inpatient Remains inpatient appropriate because: Severity of illness  Planned Discharge Destination: Skilled nursing facility  DVT prophylaxis.  Lovenox Time spent: 45 minutes  This record has been created using Therapist, sports. Errors have been sought and corrected,but may not always be located. Such creation errors do not reflect on the standard of care.   Author: Lorella Nimrod, MD 02/09/2022 3:46 PM  For on call review [email or web address].

## 2022-02-09 NOTE — TOC Progression Note (Signed)
Transition of Care [facility]) - Progression Note    Patient Details  Name: Lisa Koch MRN: [id number] Date of Birth: 1925/02/14  Transition of Care [facility]) CM/SW [place], RN Phone Number: 02/09/2022, 1:14 PM  Clinical Narrative:     The patient and her son are agreeable for her to go to [facility]< They would like [facility], I sent the bedsearch obtained PASSR and completed Fl2  Expected Discharge Plan: [place] Barriers to Discharge: SNF Pending bed offer  Expected Discharge Plan and Services Expected Discharge Plan: [place]                                               Social Determinants of Health (SDOH) Interventions    Readmission Risk Interventions     No data to display

## 2022-02-09 NOTE — Progress Notes (Signed)
Occupational Therapy Treatment Patient Details Name: Lisa Koch MRN: [id number] DOB: Jun 12, 1924 Today's Date: 02/09/2022   History of present illness Pt is a 88 y/o F admitted on 02/06/22 after presenting to the ED with R hip pain & deformity following a fall. Pt found to have R hip fx. Pt underwent R IM nai placement. Pt also found to have anterior dislocation of R shoulder & fx of greater tuberosity of R humerus. Pt underwent closed reduction in the ED. PMH: HTN, GERD, RA. Per secure chat with Dr. Posey Pronto on 02/08/22, he cleared pt to use RW for mobility as long as RUE remained close to her side to minimize ROM.   OT comments  Pt received seated EOB with PT in room. Appearing alert and agreeable; son in room as well; willing to work with OT on t/f to [facility] next to bed, as pt reports needing to urinate and move bowels. T/f MOD A HHA x2. See flowsheet below for further details of session. Left seated in recliner with all needs in reach, son in room.     Recommendations for follow up therapy are one component of a multi-disciplinary discharge planning process, led by the attending physician.  Recommendations may be updated based on patient status, additional functional criteria and insurance authorization.    Follow Up Recommendations  Skilled nursing-short term rehab (<3 hours/day)     Assistance Recommended at Discharge Frequent or constant Supervision/Assistance  Patient can return home with the following  Two people to help with walking and/or transfers;A lot of help with bathing/dressing/bathroom;Assistance with cooking/housework;Assistance with feeding;Direct supervision/assist for medications management;Direct supervision/assist for financial management;Assist for transportation;Help with stairs or ramp for entrance   Equipment Recommendations  Other (comment) (defer to next level of care)    Recommendations for Other Services      Precautions / Restrictions  Precautions Precautions: Fall Restrictions Weight Bearing Restrictions: Yes RUE Weight Bearing: Partial weight bearing RUE Partial Weight Bearing Percentage or Pounds: Keep RUE close to the body in sling at all times; only WB if using for mobility, per MD RLE Weight Bearing: Weight bearing as tolerated       Mobility Bed Mobility                    Transfers                   General transfer comment: STS from EOB and recliner; MOD A x2 handheld. Pt able to t/f MOD A x1 with hemi-walker from seated on BSC, then chair brought around behind pt (in place of [facility]) for pt to sit in chair. Pt with difficulty transferring SST; pain in RLE with activity.     Balance           Standing balance support: Bilateral upper extremity supported, During functional activity Standing balance-Leahy Scale: Poor                             ADL either performed or assessed with clinical judgement   ADL Overall ADL's : Needs assistance/impaired                     Lower Body Dressing: Total assistance Lower Body Dressing Details (indicate cue type and reason): total assist to doff socks (soiled with urine from urinary incontience while standing) and total assist to re-don new socks. Toilet Transfer: Moderate assistance;+2 for physical assistance;Stand-pivot Toilet Transfer Details (indicate  cue type and reason): Pt transferred onto [facility] today next to bed, handheld x2 MOD A Toileting- Clothing Manipulation and Hygiene: Maximal assistance Toileting - Clothing Manipulation Details (indicate cue type and reason): Pt able to wipe the front from seated on BSC set up; OT assisted with hygiene for bowel movement while pt in standing with PT supporting and L hemi-walker, dependent bowel hygiene.            Extremity/Trunk Assessment Upper Extremity Assessment Upper Extremity Assessment: RUE deficits/detail RUE Deficits / Details: RUE in sling; R shoulder fx; was  dislocated and was reduced in ED. No shoulder surgery at this time. MD order states to keep in sling at all times, no ROM.   Lower Extremity Assessment Lower Extremity Assessment: Defer to PT evaluation;Generalized weakness        Vision       Perception     Praxis      Cognition Arousal/Alertness: Awake/alert Behavior During Therapy: WFL for tasks assessed/performed Overall Cognitive Status: Within Functional Limits for tasks assessed                                 General Comments: Had to use gestures a lot, as pt is extremely HOH. Hearing aides in.        Exercises      Shoulder Instructions       General Comments      Pertinent Vitals/ Pain       Pain Assessment Pain Assessment: Faces Faces Pain Scale: Hurts little more Pain Location: RLE with mobility Pain Descriptors / Indicators: Discomfort, Grimacing, Guarding Pain Intervention(s): Limited activity within patient's tolerance  Home Living                                          Prior Functioning/Environment              Frequency  Min 2X/week        Progress Toward Goals  OT Goals(current goals can now be found in the care plan section)  Progress towards OT goals: Progressing toward goals  Acute Rehab OT Goals Patient Stated Goal: pain to stop OT Goal Formulation: With patient/family Time For Goal Achievement: 02/22/22 Potential to Achieve Goals: Fair ADL Goals Pt Will Perform Grooming: with modified independence;standing Pt Will Perform Upper Body Dressing: with modified independence;sitting Pt Will Perform Lower Body Dressing: with modified independence;sit to/from stand Pt Will Transfer to Toilet: with modified independence;ambulating;grab bars Pt Will Perform Toileting - Clothing Manipulation and hygiene: with modified independence;sit to/from stand  Plan Discharge plan remains appropriate    Co-evaluation    PT/OT/SLP Co-Evaluation/Treatment:  Yes Reason for Co-Treatment: Complexity of the patient's impairments (multi-system involvement);For patient/therapist safety;To address functional/ADL transfers   OT goals addressed during session: ADL's and self-care      AM-PAC OT "6 Clicks" Daily Activity     Outcome Measure   Help from another person eating meals?: A Little Help from another person taking care of personal grooming?: A Little Help from another person toileting, which includes using toliet, bedpan, or urinal?: A Lot Help from another person bathing (including washing, rinsing, drying)?: A Lot Help from another person to put on and taking off regular upper body clothing?: A Lot Help from another person to put on and taking off regular lower body  clothing?: Total 6 Click Score: 13    End of Session Equipment Utilized During Treatment: Gait belt;Other (comment) (hemi-walker)  OT Visit Diagnosis: Unsteadiness on feet (R26.81);Muscle weakness (generalized) (M62.81);History of falling (Z91.81)   Activity Tolerance Patient limited by pain   Patient Left in chair;with family/visitor present   Nurse Communication Mobility status;Precautions        Time: 0340-3524 OT Time Calculation (min): 30 min  Charges: OT General Charges $OT Visit: 1 Visit OT Treatments $Self Care/Home Management : 8-22 mins  Waymon Amato, MS, OTR/L   Lisa Koch 02/09/2022, 1:08 PM

## 2022-02-09 NOTE — Assessment & Plan Note (Signed)
S/p ORIF.  Pain seems well-controlled with current regimen. Home aspirin was resumed after surgery. Orthopedic surgery cleared her for discharge as she will be weightbearing on right lower extremity as tolerated. -PT/OT recommending short-term rehab -Continue with supportive care and pain management

## 2022-02-09 NOTE — Progress Notes (Signed)
Nutrition Follow-up  DOCUMENTATION CODES:   Not applicable  INTERVENTION:   -Continue Ensure Enlive po BID, each supplement provides 350 kcal and 20 grams of protein.  -Magic cup BID with meals, each supplement provides 290 kcal and 9 grams of protein  -MVI with minerals daily  NUTRITION DIAGNOSIS:   Increased nutrient needs related to hip fracture, post-op healing as evidenced by estimated needs.  Ongoing  GOAL:   Patient will meet greater than or equal to 90% of their needs  Progressing   MONITOR:   PO intake, Supplement acceptance, Weight trends  REASON FOR ASSESSMENT:   Consult Hip fracture protocol  ASSESSMENT:   88 year old female who presented to the ED on 11/24 after a mechanical fall. PMH of HTN, GERD, rheumatoid arthritis, moderate tricuspid regurgitation. Pt admitted with R intertrochanteric hip fx, R shoulder glenohumeral dislocation, and fx of greater tuberosity.  11/25 - s/p IM nailing of R femur with cephalomedullary device, closed management of R greater tuberosity fx   Reviewed I/O's: -160 ml x 24 hours and +1.8 L since admission  UOP: 400 ml x 24 hours   Pt sitting up in bed at time of visit. Pt is very anxious and explains that she is hard of hearing and her hearing aids are "out of whack". Per pt, "I can hear you, but I can't understand you".   Observed breakfast tray- pt ate a few bites of egg and toast. No meal completion data available to assess at this time.   Per MD notes, plan to discharge to SNF once bed is available.   Medications reviewed and include colace, folic acid, potassium chloride, and senokot.   Labs reviewed.   NUTRITION - FOCUSED PHYSICAL EXAM:  Flowsheet Row Most Recent Value  Orbital Region No depletion  Upper Arm Region No depletion  Thoracic and Lumbar Region No depletion  Buccal Region No depletion  Temple Region Mild depletion  Clavicle Bone Region No depletion  Clavicle and Acromion Bone Region No depletion   Scapular Bone Region No depletion  Dorsal Hand Mild depletion  Patellar Region No depletion  Anterior Thigh Region No depletion  Posterior Calf Region No depletion  Edema (RD Assessment) Mild  Hair Reviewed  Eyes Reviewed  Mouth Reviewed  Skin Reviewed  Nails Reviewed       Diet Order:   Diet Order             Diet regular Room service appropriate? Yes; Fluid consistency: Thin  Diet effective now                   EDUCATION NEEDS:   No education needs have been identified at this time  Skin:  Skin Assessment: Skin Integrity Issues: Skin Integrity Issues:: Incisions Incisions: closed rt hip  Last BM:  02/06/22  Height:   Ht Readings from Last 1 Encounters:  02/06/22 5' (1.524 m)    Weight:   Wt Readings from Last 1 Encounters:  02/06/22 56.2 kg    Ideal Body Weight:  45.5 kg  BMI:  Body mass index is 24.22 kg/m.  Estimated Nutritional Needs:   Kcal:  1500-1700  Protein:  75-90 grams  Fluid:  > 1.5 L    Loistine Chance, RD, LDN, [place] Registered Dietitian II Certified Diabetes Care and Education Specialist Please refer to [facility] for RD and/or RD on-call/weekend/after hours pager

## 2022-02-09 NOTE — Hospital Course (Addendum)
Taken from prior notes.  Lisa Koch is a 88 y.o. female with medical history significant for hypertension, GERD, rheumatoid arthritis, and moderate tricuspid regurgitation who presents to the emergency department with right hip pain and deformity after a fall.  Patient was found to have right hip fracture.  Orthopedics was consulted and further management as below.    ED Course: Upon arrival to the ED, patient is found to be afebrile and saturating mid 90s on room air with severely elevated blood pressure.  EKG demonstrates sinus rhythm with PAC.  Chest x-ray negative for acute cardiopulmonary disease.  Head CT and cervical spine CT are negative for acute findings.  Plain radiographs of the hip demonstrate acute right intertrochanteric femur fracture.  Radiographs of the right shoulder are concerning for anterior dislocation and fracture of the greater tuberosity of the right femoral head.   Orthopedic surgery was consulted by the ED physician, type and screen was performed, and the patient was treated with fentanyl and Zofran. S/p ORIF and closed reduction of right shoulder.  11/27: Hemodynamically stable.  Hemoglobin decreased to 6.7 this morning, it was decreased to 7 postoperatively from 11.3.  1 unit of PRBC ordered. PT/OT are recommending SNF-now had a bed offer-pending insurance authorization.  11/28: Patient remained hemodynamically stable.  Hemoglobin stable at 8.6 after receiving 1 unit of PRBC.  She was also started on iron supplement.  She was also having mild hypophosphatemia which is being repleted before discharge. Obtained insurance authorization and patient is being discharged to skilled nursing facility for further rehab before going home.  She will continue with her current medications and need to have a close follow-up with her providers for further management.

## 2022-02-09 NOTE — Progress Notes (Addendum)
Patient has not urinated tonight. Bladder scanned only 192 mls in bladder. Notified NP Katy. Tech Dominique and myself attempted to get patient up to [facility] but she refused. Educated patient on how to use the squeeze/push call light to call us for the [facility].

## 2022-02-09 NOTE — Assessment & Plan Note (Signed)
S/p closed reduction by orthopedic surgery in OR.  Patient was placed in sling. -Continue with sling -Outpatient orthopedic follow-up

## 2022-02-09 NOTE — Progress Notes (Signed)
Physical Therapy Treatment Patient Details Name: Lisa Koch MRN: [id number] DOB: 1924/11/29 Today's Date: 02/09/2022   History of Present Illness Pt is a 88 y/o F admitted on 02/06/22 after presenting to the ED with R hip pain & deformity following a fall. Pt found to have R hip fx. Pt underwent R IM nai placement. Pt also found to have anterior dislocation of R shoulder & fx of greater tuberosity of R humerus. Pt underwent closed reduction in the ED. PMH: HTN, GERD, RA. Per secure chat with Dr. Posey Pronto on 02/08/22, he cleared pt to use RW for mobility as long as RUE remained close to her side to minimize ROM. (Simultaneous filing. User may not have seen previous data.)    PT Comments    Pt received in bed. No c/o pain at rest, nursing cleared pt to mobilize since she is asymptomatic. Pt tolerated AAROM to R hip with some grimacing prior to transferring to EOB with MaxA. No c/o dizziness with positional changes. Co-tx with OT for transfers and mobility in order to provide safety and support due to weight bearing restrictions of R UE, and recent surgery to R LE. Pt can weight bear through R forearm per Ortho, however did well using Hemi-walker for support. Continue PT per POC. Continue to recommend SNF once medically cleared prior to returning home with family support.   Recommendations for follow up therapy are one component of a multi-disciplinary discharge planning process, led by the attending physician.  Recommendations may be updated based on patient status, additional functional criteria and insurance authorization.  Follow Up Recommendations  Skilled nursing-short term rehab (<3 hours/day) Can patient physically be transported by private vehicle: No   Assistance Recommended at Discharge Frequent or constant Supervision/Assistance  Patient can return home with the following Two people to help with walking and/or transfers;Two people to help with bathing/dressing/bathroom;Help with stairs  or ramp for entrance;Assistance with feeding;Assistance with cooking/housework;Direct supervision/assist for financial management;Assist for transportation   Equipment Recommendations  None recommended by PT    Recommendations for Other Services       Precautions / Restrictions Precautions Precautions: Fall (Simultaneous filing. User may not have seen previous data.) Required Braces or Orthoses: Sling (R UE) Restrictions Weight Bearing Restrictions: Yes (Simultaneous filing. User may not have seen previous data.) RUE Weight Bearing: Partial weight bearing (Simultaneous filing. User may not have seen previous data.) RUE Partial Weight Bearing Percentage or Pounds: 50 (Simultaneous filing. User may not have seen previous data.) RLE Weight Bearing: Weight bearing as tolerated (Simultaneous filing. User may not have seen previous data.) Other Position/Activity Restrictions: TLSO for comfort PRN 2/2 T10 compression fx in October, ok to bear weight through Right forearm     Mobility  Bed Mobility Overal bed mobility: Needs Assistance Bed Mobility: Supine to Sit     Supine to sit: Max assist, HOB elevated     General bed mobility comments: Utilized linen to turn and scoot pt to edge of bed    Transfers Overall transfer level: Needs assistance Equipment used: 2 person hand held assist Transfers: Sit to/from Stand, Bed to chair/wheelchair/BSC Sit to Stand: Mod assist Stand pivot transfers: Mod assist, +2 physical assistance         General transfer comment: Pt also completed sit to stand transfer with hemi-walker and ModA (Simultaneous filing. User may not have seen previous data.)    Ambulation/Gait Ambulation/Gait assistance: Mod assist Gait Distance (Feet): 2 Feet Assistive device: 2 person hand held assist Gait  Pattern/deviations: Decreased stance time - right, Decreased dorsiflexion - right, Decreased dorsiflexion - left, Decreased step length - left, Decreased step  length - right, Decreased weight shift to right Gait velocity: decreased     General Gait Details: Decreased weight shift to RLE, decreased step length LLE, no dizziness with positional changes   Stairs             Wheelchair Mobility    Modified Rankin (Stroke Patients Only)       Balance Overall balance assessment: Needs assistance Sitting-balance support: Feet supported, Bilateral upper extremity supported Sitting balance-Leahy Scale: Fair     Standing balance support: Bilateral upper extremity supported, During functional activity (Simultaneous filing. User may not have seen previous data.) Standing balance-Leahy Scale: Poor (Simultaneous filing. User may not have seen previous data.) Standing balance comment: Pt stood statically for ~1 minute for hygiene purposes with MinA for balance and support of hemi-walker                            Cognition Arousal/Alertness: Awake/alert (Simultaneous filing. User may not have seen previous data.) Behavior During Therapy: [facility] for tasks assessed/performed (Simultaneous filing. User may not have seen previous data.) Overall Cognitive Status: Within Functional Limits for tasks assessed (Simultaneous filing. User may not have seen previous data.)                                 General Comments: Had to use gestures a lot, as pt is extremely HOH. Hard to assess full cognitive functioning due to hearing impairment. Pt coughing during session, so OT did not remove OT's mask for safety, although pt would likely have easier time communicating if she could see mouth of the speaker. (Simultaneous filing. User may not have seen previous data.)        Exercises General Exercises - Lower Extremity Ankle Circles/Pumps: AROM, Both, 10 reps Quad Sets: AROM, Both, 10 reps Heel Slides: AAROM, Right, 5 reps Hip ABduction/ADduction: AAROM, Right, 5 reps    General Comments General comments (skin integrity, edema,  etc.): R UE and LE edema      Pertinent Vitals/Pain Pain Assessment Pain Assessment: Faces (Simultaneous filing. User may not have seen previous data.) Faces Pain Scale: Hurts little more (Simultaneous filing. User may not have seen previous data.) Pain Location: RLE with mobility (Simultaneous filing. User may not have seen previous data.) Pain Descriptors / Indicators: Discomfort, Grimacing, Guarding (Simultaneous filing. User may not have seen previous data.) Pain Intervention(s): Monitored during session, Limited activity within patient's tolerance (Simultaneous filing. User may not have seen previous data.)    Home Living                          Prior Function            PT Goals (current goals can now be found in the care plan section) Acute Rehab PT Goals Patient Stated Goal: get better Progress towards PT goals: Progressing toward goals    Frequency    BID      PT Plan Current plan remains appropriate    Co-evaluation PT/OT/SLP Co-Evaluation/Treatment: Yes Reason for Co-Treatment: Complexity of the patient's impairments (multi-system involvement);For patient/therapist safety (Simultaneous filing. User may not have seen previous data.) PT goals addressed during session: Mobility/safety with mobility;Balance OT goals addressed during session: ADL's and self-care  AM-PAC PT "6 Clicks" Mobility   Outcome Measure  Help needed turning from your back to your side while in a flat bed without using bedrails?: A Lot Help needed moving from lying on your back to sitting on the side of a flat bed without using bedrails?: A Lot Help needed moving to and from a bed to a chair (including a wheelchair)?: Total Help needed standing up from a chair using your arms (e.g., wheelchair or bedside chair)?: Total Help needed to walk in hospital room?: Total Help needed climbing 3-5 steps with a railing? : Total 6 Click Score: 8    End of Session Equipment Utilized  During Treatment: Gait belt (R UE sling) Activity Tolerance: Patient tolerated treatment well Patient left: in chair;with family/visitor present;with nursing/sitter in room Nurse Communication: Mobility status PT Visit Diagnosis: Unsteadiness on feet (R26.81);Pain;Other abnormalities of gait and mobility (R26.89);Muscle weakness (generalized) (M62.81);Difficulty in walking, not elsewhere classified (R26.2) Pain - Right/Left: Right Pain - part of body: Hip     Time: 1120-1220 PT Time Calculation (min) (ACUTE ONLY): 60 min  Charges:  $Therapeutic Exercise: 8-22 mins $Therapeutic Activity: 23-37 mins                    Mikel Cella, PTA    Josie Dixon 02/09/2022, 1:21 PM

## 2022-02-09 NOTE — Assessment & Plan Note (Signed)
Blood pressure significantly elevated on arrival to ED, most likely secondary to pain and anxiety.  No evidence of endorgan injury. Currently seems stable. -Continue with verapamil and lisinopril -Continue with as needed labetalol

## 2022-02-09 NOTE — Assessment & Plan Note (Signed)
Hemoglobin decreased to 6.7, they decreased to 7 from 11.3 after the surgery. -Check anemia panel -1 unit PRBC -Start her on iron supplement -Continue to monitor -Transfuse if below 7

## 2022-02-09 NOTE — Progress Notes (Signed)
Physical Therapy Treatment Patient Details Name: Lisa Koch MRN: [id number] DOB: 1925/03/03 Today's Date: 02/09/2022   History of Present Illness Pt is a 88 y/o F admitted on 02/06/22 after presenting to the ED with R hip pain & deformity following a fall. Pt found to have R hip fx. Pt underwent R IM nai placement. Pt also found to have anterior dislocation of R shoulder & fx of greater tuberosity of R humerus. Pt underwent closed reduction in the ED. PMH: HTN, GERD, RA. Per secure chat with Dr. Posey Pronto on 02/08/22, he cleared pt to use RW for mobility as long as RUE remained close to her side to minimize ROM.    PT Comments    Pt tolerated ~2 hours up in chair and now requesting return to bed.  Chair positioned so pt could transfer towards Left side. MaxA to stand from chair, ModA to complete stand pivot and turn towards bed. Once sitting, pt required MaxA for sit to supine. Pillows utilized to support R UE and elevate LE's. Discussed POC with pt and family, possible transition to [facility] once medically stable.    Recommendations for follow up therapy are one component of a multi-disciplinary discharge planning process, led by the attending physician.  Recommendations may be updated based on patient status, additional functional criteria and insurance authorization.  Follow Up Recommendations  Skilled nursing-short term rehab (<3 hours/day) Can patient physically be transported by private vehicle: No   Assistance Recommended at Discharge Frequent or constant Supervision/Assistance  Patient can return home with the following Two people to help with walking and/or transfers;Two people to help with bathing/dressing/bathroom;Help with stairs or ramp for entrance;Assistance with feeding;Assistance with cooking/housework;Direct supervision/assist for financial management;Assist for transportation   Equipment Recommendations  None recommended by PT    Recommendations for Other Services        Precautions / Restrictions Precautions Precautions: Fall Required Braces or Orthoses: Sling Restrictions Weight Bearing Restrictions: Yes RUE Weight Bearing: Partial weight bearing RUE Partial Weight Bearing Percentage or Pounds: 50 RLE Weight Bearing: Weight bearing as tolerated Other Position/Activity Restrictions: TLSO for comfort PRN 2/2 T10 compression fx in October, ok to bear weight through Right forearm     Mobility  Bed Mobility Overal bed mobility: Needs Assistance Bed Mobility: Sit to Supine     Supine to sit: Max assist, HOB elevated Sit to supine: Max assist   General bed mobility comments: Utilized linen to turn and scoot pt to edge of bed    Transfers Overall transfer level: Needs assistance Equipment used: None Transfers: Sit to/from Stand, Bed to chair/wheelchair/BSC Sit to Stand: Max assist Stand pivot transfers: Max assist         General transfer comment: Pt also completed sit to stand transfer with hemi-walker and ModA (Simultaneous filing. User may not have seen previous data.)    Ambulation/Gait Ambulation/Gait assistance: Mod assist Gait Distance (Feet): 2 Feet Assistive device: 2 person hand held assist Gait Pattern/deviations: Decreased stance time - right, Decreased dorsiflexion - right, Decreased dorsiflexion - left, Decreased step length - left, Decreased step length - right, Decreased weight shift to right Gait velocity: decreased     General Gait Details: Decreased weight shift to RLE, decreased step length LLE, no dizziness with positional changes   Stairs             Wheelchair Mobility    Modified Rankin (Stroke Patients Only)       Balance Overall balance assessment: Needs assistance Sitting-balance support: Feet  supported, Bilateral upper extremity supported Sitting balance-Leahy Scale: Fair     Standing balance support: Bilateral upper extremity supported, During functional activity Standing balance-Leahy  Scale: Poor Standing balance comment: Pt stood statically for ~1 minute for hygiene purposes with MinA for balance and support of hemi-walker                            Cognition Arousal/Alertness: Awake/alert Behavior During Therapy: WFL for tasks assessed/performed Overall Cognitive Status: Within Functional Limits for tasks assessed                                 General Comments: Had to use gestures a lot, as pt is extremely HOH. Hard to assess full cognitive functioning due to hearing impairment. Pt coughing during session, so OT did not remove OT's mask for safety, although pt would likely have easier time communicating if she could see mouth of the speaker.        Exercises General Exercises - Lower Extremity Ankle Circles/Pumps: AROM, Both, 10 reps Quad Sets: AROM, Both, 10 reps Heel Slides: AAROM, Right, 5 reps Hip ABduction/ADduction: AAROM, Right, 5 reps    General Comments General comments (skin integrity, edema, etc.): R UE and LE edema      Pertinent Vitals/Pain Pain Assessment Pain Assessment: Faces Faces Pain Scale: Hurts little more Pain Location: RLE with mobility Pain Descriptors / Indicators: Discomfort, Grimacing, Guarding Pain Intervention(s): Monitored during session, Limited activity within patient's tolerance    Home Living                          Prior Function            PT Goals (current goals can now be found in the care plan section) Acute Rehab PT Goals Patient Stated Goal: get better Progress towards PT goals: Progressing toward goals    Frequency    BID      PT Plan Current plan remains appropriate    Co-evaluation PT/OT/SLP Co-Evaluation/Treatment: Yes Reason for Co-Treatment: Complexity of the patient's impairments (multi-system involvement);For patient/therapist safety (Simultaneous filing. User may not have seen previous data.) PT goals addressed during session: Mobility/safety with  mobility;Balance OT goals addressed during session: ADL's and self-care      AM-PAC PT "6 Clicks" Mobility   Outcome Measure  Help needed turning from your back to your side while in a flat bed without using bedrails?: A Lot Help needed moving from lying on your back to sitting on the side of a flat bed without using bedrails?: A Lot Help needed moving to and from a bed to a chair (including a wheelchair)?: A Lot Help needed standing up from a chair using your arms (e.g., wheelchair or bedside chair)?: A Lot Help needed to walk in hospital room?: Total Help needed climbing 3-5 steps with a railing? : Total 6 Click Score: 10    End of Session Equipment Utilized During Treatment: Gait belt Activity Tolerance: Patient tolerated treatment well Patient left: in bed;with call bell/phone within reach;with bed alarm set;with family/visitor present;with SCD's reapplied Nurse Communication: Mobility status PT Visit Diagnosis: Unsteadiness on feet (R26.81);Pain;Other abnormalities of gait and mobility (R26.89);Muscle weakness (generalized) (M62.81);Difficulty in walking, not elsewhere classified (R26.2) Pain - Right/Left: Right Pain - part of body: Hip     Time: 1430-1452 PT Time Calculation (min) (ACUTE ONLY): 22  min  Charges:  $Therapeutic Exercise: 8-22 mins $Therapeutic Activity: 8-22 mins                     Mikel Cella, PTA   Josie Dixon 02/09/2022, 3:17 PM

## 2022-02-10 DIAGNOSIS — S72001A Fracture of unspecified part of neck of right femur, initial encounter for closed fracture: Secondary | ICD-10-CM | POA: Diagnosis not present

## 2022-02-10 LAB — MAGNESIUM: Magnesium: 2.1 mg/dL (ref 1.7–2.4)

## 2022-02-10 LAB — CBC
HCT: 25.4 % — ABNORMAL LOW (ref 36.0–46.0)
Hemoglobin: 8.6 g/dL — ABNORMAL LOW (ref 12.0–15.0)
MCH: 33 pg (ref 26.0–34.0)
MCHC: 33.9 g/dL (ref 30.0–36.0)
MCV: 97.3 fL (ref 80.0–100.0)
Platelets: 171 10*3/uL (ref 150–400)
RBC: 2.61 MIL/uL — ABNORMAL LOW (ref 3.87–5.11)
RDW: 14.7 % (ref 11.5–15.5)
WBC: 8.1 10*3/uL (ref 4.0–10.5)
nRBC: 0 % (ref 0.0–0.2)

## 2022-02-10 LAB — BASIC METABOLIC PANEL
Anion gap: 3 — ABNORMAL LOW (ref 5–15)
BUN: 24 mg/dL — ABNORMAL HIGH (ref 8–23)
CO2: 23 mmol/L (ref 22–32)
Calcium: 8.2 mg/dL — ABNORMAL LOW (ref 8.9–10.3)
Chloride: 111 mmol/L (ref 98–111)
Creatinine, Ser: 0.66 mg/dL (ref 0.44–1.00)
GFR, Estimated: >60 mL/min (ref >60)
Glucose, Bld: 119 mg/dL — ABNORMAL HIGH (ref 70–99)
Potassium: 4.5 mmol/L (ref 3.5–5.1)
Sodium: 137 mmol/L (ref 135–145)

## 2022-02-10 LAB — TYPE AND SCREEN
ABO/RH(D): O POS
Antibody Screen: NEGATIVE
Unit division: 0

## 2022-02-10 LAB — BPAM RBC
Blood Product Expiration Date: 202312262359
ISSUE DATE / TIME: 202311271016
Unit Type and Rh: 5100

## 2022-02-10 LAB — PHOSPHORUS: Phosphorus: 1.8 mg/dL — ABNORMAL LOW (ref 2.5–4.6)

## 2022-02-10 MED ORDER — BISACODYL 10 MG RE SUPP: 10.0000 mg | Freq: Every day | RECTAL | 0 refills | Status: DC | PRN

## 2022-02-10 MED ORDER — ENSURE ENLIVE PO LIQD: 237.0000 mL | Freq: Two times a day (BID) | ORAL | 12 refills | Status: AC

## 2022-02-10 MED ORDER — ACETAMINOPHEN 325 MG PO TABS: 325.0000 mg | ORAL_TABLET | Freq: Four times a day (QID) | ORAL | Status: AC | PRN

## 2022-02-10 MED ORDER — POTASSIUM & SODIUM PHOSPHATES 280-160-250 MG PO PACK: 2.0000 | PACK | Freq: Once | ORAL | Status: DC

## 2022-02-10 MED ORDER — FERROUS SULFATE 325 (65 FE) MG PO TABS: 325.0000 mg | ORAL_TABLET | Freq: Two times a day (BID) | ORAL | 3 refills | Status: AC

## 2022-02-10 MED ORDER — SODIUM PHOSPHATES 45 MMOLE/15ML IV SOLN
30.0000 mmol | Freq: Once | INTRAVENOUS | Status: AC
  Administered 2022-02-10: 30 mmol via INTRAVENOUS
  Filled 2022-02-10: qty 10

## 2022-02-10 MED ORDER — METHOCARBAMOL 500 MG PO TABS: 500.0000 mg | ORAL_TABLET | Freq: Four times a day (QID) | ORAL | Status: AC | PRN

## 2022-02-10 MED ORDER — FERROUS SULFATE 325 (65 FE) MG PO TABS
325.0000 mg | ORAL_TABLET | Freq: Two times a day (BID) | ORAL | Status: DC
  Administered 2022-02-10 – 2022-02-11 (×2): 325 mg via ORAL
  Filled 2022-02-10 (×2): qty 1

## 2022-02-10 NOTE — Care Management Important Message (Signed)
Important Message  Patient Details  Name: Lisa Koch MRN: [id number] Date of Birth: May 04, 1924   Medicare Important Message Given:  Yes     [name] 02/10/2022, 11:19 AM

## 2022-02-10 NOTE — Discharge Summary (Signed)
Physician Discharge Summary   Patient: Lisa Koch MRN: [id number] DOB: [birth year]  Admit date:     02/06/2022  Discharge date: 02/11/22  Discharge Physician: Lorella Nimrod   PCP: Tracie Harrier, MD   Recommendations at discharge:  Please obtain CBC, BMP and phosphorus level in 1 week Follow-up with orthopedic surgery Follow-up with primary care provider  Discharge Diagnoses: Principal Problem:   Closed right hip fracture, initial encounter [facility]) Active Problems:   Closed fracture dislocation of right shoulder   Hypertensive urgency   Anemia associated with acute blood loss   Hospital Course: Taken from prior notes.  Lisa Koch is a 88 y.o. female with medical history significant for hypertension, GERD, rheumatoid arthritis, and moderate tricuspid regurgitation who presents to the emergency department with right hip pain and deformity after a fall.  Patient was found to have right hip fracture.  Orthopedics was consulted and further management as below.    ED Course: Upon arrival to the ED, patient is found to be afebrile and saturating mid 90s on room air with severely elevated blood pressure.  EKG demonstrates sinus rhythm with PAC.  Chest x-ray negative for acute cardiopulmonary disease.  Head CT and cervical spine CT are negative for acute findings.  Plain radiographs of the hip demonstrate acute right intertrochanteric femur fracture.  Radiographs of the right shoulder are concerning for anterior dislocation and fracture of the greater tuberosity of the right femoral head.   Orthopedic surgery was consulted by the ED physician, type and screen was performed, and the patient was treated with fentanyl and Zofran. S/p ORIF and closed reduction of right shoulder.  11/27: Hemodynamically stable.  Hemoglobin decreased to 6.7 this morning, it was decreased to 7 postoperatively from 11.3.  1 unit of PRBC ordered. PT/OT are recommending SNF-now had a bed offer-pending  insurance authorization.  11/28: Patient remained hemodynamically stable.  Hemoglobin stable at 8.6 after receiving 1 unit of PRBC.  She was also started on iron supplement.  She was also having mild hypophosphatemia which is being repleted before discharge. Obtained insurance authorization and patient is being discharged to skilled nursing facility for further rehab before going home.  She will continue with her current medications and need to have a close follow-up with her providers for further management.    Assessment and Plan: * Closed right hip fracture, initial encounter ([place]) S/p ORIF.  Pain seems well-controlled with current regimen. Home aspirin was resumed after surgery. Orthopedic surgery cleared her for discharge as she will be weightbearing on right lower extremity as tolerated. -PT/OT recommending short-term rehab -Continue with supportive care and pain management  Closed fracture dislocation of right shoulder S/p closed reduction by orthopedic surgery in OR.  Patient was placed in sling. -Continue with sling -Outpatient orthopedic follow-up  Hypertensive urgency Blood pressure significantly elevated on arrival to ED, most likely secondary to pain and anxiety.  No evidence of endorgan injury. Currently seems stable. -Continue with verapamil and lisinopril -Continue with as needed labetalol  Anemia associated with acute blood loss Hemoglobin decreased to 6.7, they decreased to 7 from 11.3 after the surgery. -Check anemia panel -1 unit PRBC -Start her on iron supplement -Continue to monitor -Transfuse if below 7        Pain control - [facility] Controlled Substance Reporting System database was reviewed. and patient was instructed, not to drive, operate heavy machinery, perform activities at heights, swimming or participation in water activities or provide baby-sitting services while on Pain, Sleep  and Anxiety Medications; until their outpatient Physician  has advised to do so again. Also recommended to not to take more than prescribed Pain, Sleep and Anxiety Medications.  Consultants: Orthopedic surgery Procedures performed: ORIF Disposition: Skilled nursing facility Diet recommendation:  Discharge Diet Orders (From admission, onward)     Start     Ordered   02/10/22 0000  Diet - low sodium heart healthy        02/10/22 1047           Cardiac diet DISCHARGE MEDICATION: Allergies as of 02/11/2022       Reactions   Nabumetone Swelling   Clindamycin Other (See Comments)   Other reaction(s): abdominal pain, Other (See Comments)        Medication List     TAKE these medications    acetaminophen 325 MG tablet Commonly known as: TYLENOL Take 1-2 tablets (325-650 mg total) by mouth every 6 (six) hours as needed for mild pain (pain score 1-3 or temp > 100.5).   aspirin EC 81 MG tablet Take 81 mg by mouth daily.   bisacodyl 10 MG suppository Commonly known as: DULCOLAX Place 1 suppository (10 mg total) rectally daily as needed for moderate constipation.   Calcium High Potency/Vitamin D 600-5 MG-MCG Tabs Generic drug: Calcium Carb-Cholecalciferol Take 1 tablet by mouth 2 (two) times daily with a meal.   CALCIUM PO Take by mouth daily.   diclofenac Sodium 1 % Gel Commonly known as: VOLTAREN Apply 4 g topically 4 (four) times daily as needed (MUSCLE/JOINT PAIN).   enoxaparin 40 MG/0.4ML injection Commonly known as: LOVENOX Inject 0.4 mLs (40 mg total) into the skin daily for 14 days.   feeding supplement Liqd Take 237 mLs by mouth 2 (two) times daily between meals.   ferrous sulfate 325 (65 FE) MG tablet Take 1 tablet (325 mg total) by mouth 2 (two) times daily with a meal.   folic acid 1 MG tablet Commonly known as: FOLVITE Take 2 mg by mouth daily.   lisinopril 40 MG tablet Commonly known as: ZESTRIL Take 40 mg by mouth daily.   loratadine 10 MG tablet Commonly known as: CLARITIN Take 10 mg by mouth  daily.   methocarbamol 500 MG tablet Commonly known as: ROBAXIN Take 1 tablet (500 mg total) by mouth every 6 (six) hours as needed for muscle spasms.   methotrexate 2.5 MG tablet Commonly known as: RHEUMATREX Take 10 mg by mouth once a week. Every Sunday   multivitamin capsule Take 1 capsule by mouth daily.   omeprazole 10 MG capsule Commonly known as: PRILOSEC Take 10 mg by mouth daily.   oxyCODONE 5 MG immediate release tablet Commonly known as: Oxy IR/ROXICODONE Take 0.5-1 tablets (2.5-5 mg total) by mouth every 4 (four) hours as needed for moderate pain (pain score 4-6).   Potassium Chloride ER 20 MEQ Tbcr Take 20 mEq by mouth daily.   PRESERVISION AREDS PO Take 2 tablets by mouth daily.   senna-docusate 8.6-50 MG tablet Commonly known as: Senokot-S Take 2 tablets by mouth 2 (two) times daily. Until stooling regularly   traMADol 50 MG tablet Commonly known as: ULTRAM Take 1 tablet (50 mg total) by mouth every 6 (six) hours as needed for moderate pain.   verapamil 240 MG CR tablet Commonly known as: CALAN-SR Take 240 mg by mouth daily.               Discharge Care Instructions  (From admission, onward)  Start     Ordered   02/11/22 0000  Leave dressing on - Keep it clean, dry, and intact until clinic visit        02/11/22 0814   02/10/22 0000  Leave dressing on - Keep it clean, dry, and intact until clinic visit        02/10/22 1047            Contact information for follow-up providers     Reche Dixon, PA-C Follow up on 02/20/2022.   Specialty: Orthopedic Surgery Why: For staple removal and x-rays of the right hip office to call with [id number] Contact information: [address] [place] [address] [phone]         Tracie Harrier, MD. Schedule an appointment as soon as possible for a visit on 02/20/2022.   Specialty: Internal Medicine Why: at 1045 Contact information: [address] [place] [address] [phone]              Contact information for after-discharge care     Destination     [facility] Preferred SNF .   Service: Skilled Nursing Contact information: [address] [place] [phone]                    Discharge Exam: Danley Danker Weights   02/06/22 2301  Weight: 56.2 kg   General.  Frail, hard of hearing elderly lady, in no acute distress. Pulmonary.  Lungs clear bilaterally, normal respiratory effort. CV.  Regular rate and rhythm, no JVD, rub or murmur. Abdomen.  Soft, nontender, nondistended, BS positive. CNS.  Alert and oriented .  No focal neurologic deficit. Extremities.  No edema, no cyanosis, pulses intact and symmetrical.  Right upper extremity in sling Psychiatry.  Appears to have some cognitive impairment.  Condition at discharge: stable  The results of significant diagnostics from this hospitalization (including imaging, microbiology, ancillary and laboratory) are listed below for reference.   Imaging Studies: DG HIP UNILAT WITH PELVIS 2-3 VIEWS RIGHT  Result Date: 02/07/2022 CLINICAL DATA:  Surgical internal fixation of right femur fracture. EXAM: DG HIP (WITH OR WITHOUT PELVIS) 2-3V RIGHT; DG C-ARM 1-60 MIN-NO REPORT Fluoroscopy time: 13 seconds. COMPARISON:  February 06, 2022. FINDINGS: Four intraoperative fluoroscopic images were obtained of the right hip. These images demonstrate surgical internal fixation of proximal right femoral intertrochanteric fracture. IMPRESSION: Fluoroscopic guidance provided during surgical internal fixation of proximal right femoral intertrochanteric fracture. Electronically Signed   By: Marijo Conception M.D.   On: 02/07/2022 12:49   DG C-Arm 1-60 Min-No Report  Result Date: 02/07/2022 Fluoroscopy was utilized by the requesting physician.  No radiographic interpretation.   CT Shoulder Right Wo  Contrast  Result Date: 02/07/2022 CLINICAL DATA:  Right shoulder fracture. EXAM: CT OF THE UPPER RIGHT EXTREMITY WITHOUT CONTRAST TECHNIQUE: Multidetector CT imaging of the upper right extremity was performed according to the standard protocol. RADIATION DOSE REDUCTION: This exam was performed according to the departmental dose-optimization program which includes automated exposure control, adjustment of the mA and/or kV according to patient size and/or use of iterative reconstruction technique. COMPARISON:  Radiographs, same date. FINDINGS: Comminuted and mildly displaced fractures of the greater tuberosity. No humeral neck fracture. The humeral head is normally located in the glenoid fossa. No glenoid fracture. The [facility] joint is intact. No clavicle or scapular fractures. The visualized right ribs are intact. Grossly by CT the rotator cuff tendons  are intact. IMPRESSION: 1. Comminuted and mildly displaced fractures of the greater tuberosity. 2. No humeral neck fracture. 3. Grossly by CT the rotator cuff tendons are intact. Electronically Signed   By: Marijo Sanes M.D.   On: 02/07/2022 08:51   DG Shoulder Right Portable  Result Date: 02/07/2022 CLINICAL DATA:  Status post reduction EXAM: RIGHT SHOULDER - 1 VIEW COMPARISON:  Films from earlier in the same day. FINDINGS: Humeral head is been reduced into the glenoid. Previously seen comminuted fractures involving the greater tuberosity are again noted and less displaced. No new fracture is seen. IMPRESSION: Status post reduction. Electronically Signed   By: Inez Catalina M.D.   On: 02/07/2022 02:09   DG Chest Portable 1 View  Result Date: 02/07/2022 CLINICAL DATA:  Status post fall. EXAM: PORTABLE CHEST 1 VIEW COMPARISON:  None Available. FINDINGS: The heart size and mediastinal contours are within normal limits. Low lung volumes are seen with stable moderate to marked severity elevation of the right hemidiaphragm. There is no evidence of an acute  infiltrate, pleural effusion or pneumothorax. Chronic sixth and seventh left rib fractures are seen. There is anterior dislocation of the right shoulder with an acute fracture of the greater tuberosity of the right humeral head. IMPRESSION: 1. Low lung volumes without acute cardiopulmonary disease. 2. Anterior dislocation of the right shoulder with and acute fracture of the greater tuberosity of the right humeral head. Electronically Signed   By: Virgina Norfolk M.D.   On: 02/07/2022 00:22   DG Forearm Right  Result Date: 02/07/2022 CLINICAL DATA:  Status post fall. EXAM: RIGHT FOREARM - 2 VIEW COMPARISON:  None Available. FINDINGS: There is no evidence of an acute fracture or dislocation. Chronic and degenerative changes are seen involving the lateral epicondyle of the distal right humerus. Soft tissues are unremarkable. IMPRESSION: Chronic and degenerative changes without evidence of an acute osseous abnormality. Electronically Signed   By: Virgina Norfolk M.D.   On: 02/07/2022 00:20   DG Hip Unilat  With Pelvis 2-3 Views Right  Result Date: 02/07/2022 CLINICAL DATA:  Status post fall. EXAM: DG HIP (WITH OR WITHOUT PELVIS) 2-3V RIGHT COMPARISON:  None Available. FINDINGS: There is an acute, comminuted fracture deformity seen extending through the inter trochanteric region of the proximal right femur. There is no evidence of dislocation. Moderate severity degenerative changes are seen in the form of joint space narrowing and acetabular sclerosis. IMPRESSION: Acute fracture of the proximal right femur. Electronically Signed   By: Virgina Norfolk M.D.   On: 02/07/2022 00:18   DG Humerus Right  Result Date: 02/07/2022 CLINICAL DATA:  Status post fall. EXAM: RIGHT HUMERUS - 2+ VIEW COMPARISON:  None Available. FINDINGS: There is anterior dislocation of the right humeral head with respect to the right glenoid. An acute, displaced fracture is also seen involving the greater tuberosity of the right  humeral head. Moderate severity soft tissue swelling is seen along the lateral aspect of the proximal to mid right humerus. IMPRESSION: Anterior dislocation of the right shoulder with an acute fracture of the greater tuberosity of the right humeral head. Electronically Signed   By: Virgina Norfolk M.D.   On: 02/07/2022 00:17   CT HEAD WO CONTRAST (5MM)  Result Date: 02/07/2022 CLINICAL DATA:  Head trauma, minor (Age >= 65y); Neck trauma (Age >= 65y). Per EMS pt has been staying with family for "rehab" after previous fall. Per EMS pt was walking and fell face first. Per EMS pt with +shortening and  rotation of R leg and possible deformation EXAM: CT HEAD WITHOUT CONTRAST CT CERVICAL SPINE WITHOUT CONTRAST TECHNIQUE: Multidetector CT imaging of the head and cervical spine was performed following the standard protocol without intravenous contrast. Multiplanar CT image reconstructions of the cervical spine were also generated. RADIATION DOSE REDUCTION: This exam was performed according to the departmental dose-optimization program which includes automated exposure control, adjustment of the mA and/or kV according to patient size and/or use of iterative reconstruction technique. COMPARISON:  CT head and C-spine 12/16/2021 FINDINGS: CT HEAD FINDINGS BRAIN: BRAIN Cerebral ventricle sizes are concordant with the degree of cerebral volume loss. Patchy and confluent areas of decreased attenuation are noted throughout the deep and periventricular white matter of the cerebral hemispheres bilaterally, compatible with chronic microvascular ischemic disease. No evidence of large-territorial acute infarction. No parenchymal hemorrhage. No mass lesion. No extra-axial collection. No mass effect or midline shift. No hydrocephalus. Basilar cisterns are patent. Vascular: No hyperdense vessel. Skull: No acute fracture or focal lesion. Sinuses/Orbits: Mucosal thickening of the maxillary sinuses. Paranasal sinuses and mastoid air  cells are clear. Bilateral lens replacement. Otherwise the orbits are unremarkable. Other: None. CT CERVICAL SPINE FINDINGS Alignment: Grade 1 anterolisthesis of C2 on C3, C3 on C4, C4 on C5. Mild retrolisthesis of C5 on C6. Skull base and vertebrae: Multilevel moderate severe degenerative changes spine most prominent at the C5-C6 level. Posterior longitudinal ligament calcification (2:45). Associated multilevel severe osseous neural foraminal stenosis. No severe osseous central canal stenosis. No acute fracture. No aggressive appearing focal osseous lesion or focal pathologic process. Soft tissues and spinal canal: No prevertebral fluid or swelling. No visible canal hematoma. Upper chest: Unremarkable. Other: Atherosclerotic plaque. Subcentimeter left thyroid gland hypodense nodule. Not clinically significant; no follow-up imaging recommended (ref: J Am Coll Radiol. 2015 Feb;12(2): 143-50). IMPRESSION: 1. No acute intracranial abnormality. 2. No acute displaced fracture or traumatic listhesis of the cervical spine. 3.  Aortic Atherosclerosis (ICD10-I70.0). Electronically Signed   By: Iven Finn M.D.   On: 02/07/2022 00:01   CT Cervical Spine Wo Contrast  Result Date: 02/07/2022 CLINICAL DATA:  Head trauma, minor (Age >= 65y); Neck trauma (Age >= 65y). Per EMS pt has been staying with family for "rehab" after previous fall. Per EMS pt was walking and fell face first. Per EMS pt with +shortening and rotation of R leg and possible deformation EXAM: CT HEAD WITHOUT CONTRAST CT CERVICAL SPINE WITHOUT CONTRAST TECHNIQUE: Multidetector CT imaging of the head and cervical spine was performed following the standard protocol without intravenous contrast. Multiplanar CT image reconstructions of the cervical spine were also generated. RADIATION DOSE REDUCTION: This exam was performed according to the departmental dose-optimization program which includes automated exposure control, adjustment of the mA and/or kV  according to patient size and/or use of iterative reconstruction technique. COMPARISON:  CT head and C-spine 12/16/2021 FINDINGS: CT HEAD FINDINGS BRAIN: BRAIN Cerebral ventricle sizes are concordant with the degree of cerebral volume loss. Patchy and confluent areas of decreased attenuation are noted throughout the deep and periventricular white matter of the cerebral hemispheres bilaterally, compatible with chronic microvascular ischemic disease. No evidence of large-territorial acute infarction. No parenchymal hemorrhage. No mass lesion. No extra-axial collection. No mass effect or midline shift. No hydrocephalus. Basilar cisterns are patent. Vascular: No hyperdense vessel. Skull: No acute fracture or focal lesion. Sinuses/Orbits: Mucosal thickening of the maxillary sinuses. Paranasal sinuses and mastoid air cells are clear. Bilateral lens replacement. Otherwise the orbits are unremarkable. Other: None. CT CERVICAL SPINE FINDINGS  Alignment: Grade 1 anterolisthesis of C2 on C3, C3 on C4, C4 on C5. Mild retrolisthesis of C5 on C6. Skull base and vertebrae: Multilevel moderate severe degenerative changes spine most prominent at the C5-C6 level. Posterior longitudinal ligament calcification (2:45). Associated multilevel severe osseous neural foraminal stenosis. No severe osseous central canal stenosis. No acute fracture. No aggressive appearing focal osseous lesion or focal pathologic process. Soft tissues and spinal canal: No prevertebral fluid or swelling. No visible canal hematoma. Upper chest: Unremarkable. Other: Atherosclerotic plaque. Subcentimeter left thyroid gland hypodense nodule. Not clinically significant; no follow-up imaging recommended (ref: J Am Coll Radiol. 2015 Feb;12(2): 143-50). IMPRESSION: 1. No acute intracranial abnormality. 2. No acute displaced fracture or traumatic listhesis of the cervical spine. 3.  Aortic Atherosclerosis (ICD10-I70.0). Electronically Signed   By: Iven Finn M.D.    On: 02/07/2022 00:01   DG Thoracic Spine 2 View  Result Date: 01/20/2022 CLINICAL DATA:  T10 fracture.  No pain currently. EXAM: THORACIC SPINE 2 VIEWS COMPARISON:  CT 12/16/2021. Thoracic spine 12/14/2021. Chest x-ray 12/14/2021. FINDINGS: Thoracic spine numbered as per prior CT. Diffuse osteopenia and degenerative change. Progressive compression of the previously identified T10 compression fracture noted. Stable probable chronic T4 compression fracture. Old left rib fractures again noted. No new fractures are identified. Bibasilar subsegmental atelectasis. Prominent gallstone again noted. IMPRESSION: Progressive compression of the previously identified T10 compression fracture. Stable probable chronic T4 compression fracture. Electronically Signed   By: Marcello Moores  Register M.D.   On: 01/20/2022 07:14    Microbiology: Results for orders placed or performed during the hospital encounter of 12/16/21  Blood culture (single)     Status: None   Collection Time: 12/16/21  7:54 PM   Specimen: BLOOD  Result Value Ref Range Status   Specimen Description BLOOD LEFT FOREARM  Final   Special Requests   Final    BOTTLES DRAWN AEROBIC AND ANAEROBIC Blood Culture results may not be optimal due to an inadequate volume of blood received in culture bottles   Culture   Final    NO GROWTH 5 DAYS Performed at [facility], [address] [place], [place] [address]    Report Status 12/21/2021 FINAL  Final    Labs: CBC: Recent Labs  Lab 02/06/22 2249 02/07/22 0429 02/08/22 0800 02/09/22 0523 02/09/22 1728 02/10/22 0619  WBC 12.0* 21.2* 14.8* 11.4* 11.3* 8.1  NEUTROABS 8.1*  --   --   --   --   --   HGB 12.5 11.3* 7.0* 6.7* 8.5* 8.6*  HCT 37.9 33.9* 20.9* 20.2* 25.7* 25.4*  MCV 99.0 99.7 98.6 100.0 97.7 97.3  PLT 248 212 144* 159 169 893   Basic Metabolic Panel: Recent Labs  Lab 02/06/22 2249 02/07/22 0428 02/07/22 0429 02/08/22 0508 02/09/22 0523 02/10/22 0619  NA 137  --  136 134*  137 137  K 3.5  --  4.6 4.2 4.1 4.5  CL 104  --  105 107 111 111  CO2 23  --  '23 23 22 23  '$ GLUCOSE 109*  --  152* 113* 112* 119*  BUN 21  --  18 18 26* 24*  CREATININE 0.74  --  0.63 0.73 0.87 0.66  CALCIUM 9.5  --  8.8* 7.6* 8.1* 8.2*  MG  --   --  1.7 1.8 2.2 2.1  PHOS  --  3.1  --  2.6 2.6 1.8*   Liver Function Tests: No results for input(s): "AST", "ALT", "ALKPHOS", "BILITOT", "PROT", "ALBUMIN" in the  last 168 hours. CBG: No results for input(s): "GLUCAP" in the last 168 hours.  Discharge time spent: greater than 30 minutes.  This record has been created using Systems analyst. Errors have been sought and corrected,but may not always be located. Such creation errors do not reflect on the standard of care.   Signed: Lorella Nimrod, MD [facility] 02/11/2022

## 2022-02-10 NOTE — Progress Notes (Signed)
Physical Therapy Treatment Patient Details Name: Lisa Koch MRN: [id number] DOB: [birth year] Today's Date: 02/10/2022   History of Present Illness Pt is a 88 y/o F admitted on 02/06/22 after presenting to the ED with R hip pain & deformity following a fall. Pt found to have R hip fx. Pt underwent R IM nai placement. Pt also found to have anterior dislocation of R shoulder & fx of greater tuberosity of R humerus. Pt underwent closed reduction in the ED. PMH: HTN, GERD, RA. Per secure chat with Dr. Posey Pronto on 02/08/22, he cleared pt to use RW for mobility as long as RUE remained close to her side to minimize ROM.    PT Comments    Pt received in chair, family at bedside. Answered questions/concerns regarding current POC and expectations at d/c to SNF. Pt c/o Right elbow "burning". Pt noted to have a dressing covering her Right forearm with dried drainage. Nursing notified. Pt seen this am for sit<>stand transfers with use of hemi-walker. Pt requires loud, clear verbal cues due to hearing impairment. Pt is also very fearful of falling which currently is self limiting along with poor tolerance for weight acceptance through R LE in standing. Pt may benefit from utilizing //bars once at rehab. Will continue PT this pm since pt is awaiting IV phosphorus prior to d/c.   Recommendations for follow up therapy are one component of a multi-disciplinary discharge planning process, led by the attending physician.  Recommendations may be updated based on patient status, additional functional criteria and insurance authorization.  Follow Up Recommendations  Skilled nursing-short term rehab (<3 hours/day) Can patient physically be transported by private vehicle: No   Assistance Recommended at Discharge Frequent or constant Supervision/Assistance  Patient can return home with the following Two people to help with walking and/or transfers;Two people to help with bathing/dressing/bathroom;Help with stairs or ramp  for entrance;Assistance with feeding;Assistance with cooking/housework;Direct supervision/assist for financial management;Assist for transportation   Equipment Recommendations  None recommended by PT    Recommendations for Other Services       Precautions / Restrictions Precautions Precautions: Fall Precaution Comments:  (High fear of falling) Required Braces or Orthoses: Sling Restrictions Weight Bearing Restrictions: Yes RUE Weight Bearing: Partial weight bearing RUE Partial Weight Bearing Percentage or Pounds: 50 RLE Weight Bearing: Weight bearing as tolerated Other Position/Activity Restrictions: TLSO for comfort PRN 2/2 T10 compression fx in October, ok to bear weight through Right forearm     Mobility  Bed Mobility               General bed mobility comments:  (Pt up in recliner pre/post session)    Transfers Overall transfer level: Needs assistance Equipment used: Hemi-walker Transfers: Sit to/from Stand Sit to Stand: Max assist           General transfer comment: Pt struggles with weight shifting forward for push off    Ambulation/Gait               General Gait Details: Several attempts to advance forward with support of hemi-walker, pt struggles with supporting self through walker on Left and accepting weight through R LE   Stairs             Wheelchair Mobility    Modified Rankin (Stroke Patients Only)       Balance Overall balance assessment: Needs assistance Sitting-balance support: Feet supported, No upper extremity supported Sitting balance-Leahy Scale: Good     Standing balance support: Single extremity supported, [place]  on assistive device for balance Standing balance-Leahy Scale: Poor Standing balance comment: Pt initially required ModA in standing, transitioning to CG/MinA with walker                            Cognition Arousal/Alertness: Awake/alert Behavior During Therapy: WFL for tasks  assessed/performed Overall Cognitive Status: Within Functional Limits for tasks assessed                                 General Comments: Pt is able to follow single step commands        Exercises General Exercises - Lower Extremity Ankle Circles/Pumps: AROM, Both, 15 reps, Seated Long Arc Quad: AROM, Both, 10 reps, Seated Hip Flexion/Marching: AAROM, Right, 10 reps    General Comments General comments (skin integrity, edema, etc.):  (Pt c/o "burning" R elbow which has a dressing with dried dressing on it. Nursing notified of concerns for possible dressing change/skin check)      Pertinent Vitals/Pain Pain Assessment Pain Assessment: Faces Faces Pain Scale: Hurts little more Pain Location: RLE with mobility and standing Pain Descriptors / Indicators: Discomfort, Grimacing, Guarding Pain Intervention(s): Monitored during session, Patient requesting pain meds-RN notified, Limited activity within patient's tolerance    Home Living                          Prior Function            PT Goals (current goals can now be found in the care plan section) Acute Rehab PT Goals Patient Stated Goal: get better    Frequency    BID      PT Plan Current plan remains appropriate    Co-evaluation              AM-PAC PT "6 Clicks" Mobility   Outcome Measure  Help needed turning from your back to your side while in a flat bed without using bedrails?: A Lot Help needed moving from lying on your back to sitting on the side of a flat bed without using bedrails?: A Lot Help needed moving to and from a bed to a chair (including a wheelchair)?: A Lot Help needed standing up from a chair using your arms (e.g., wheelchair or bedside chair)?: A Lot Help needed to walk in hospital room?: Total Help needed climbing 3-5 steps with a railing? : Total 6 Click Score: 10    End of Session Equipment Utilized During Treatment: Gait belt (R UE sling) Activity  Tolerance: Patient tolerated treatment well Patient left: in chair;with call bell/phone within reach;with family/visitor present Nurse Communication: Mobility status (R UE dressing assessment/concerns) PT Visit Diagnosis: Unsteadiness on feet (R26.81);Pain;Other abnormalities of gait and mobility (R26.89);Muscle weakness (generalized) (M62.81);Difficulty in walking, not elsewhere classified (R26.2) Pain - Right/Left: Right Pain - part of body: Hip     Time: 1130-1225 PT Time Calculation (min) (ACUTE ONLY): 55 min  Charges:  $Gait Training: 8-22 mins $Therapeutic Exercise: 8-22 mins $Therapeutic Activity: 23-37 mins                    Mikel Cella, PTA    Josie Dixon 02/10/2022, 2:12 PM

## 2022-02-10 NOTE — TOC Progression Note (Signed)
Transition of Care [facility]) - Progression Note    Patient Details  Name: Lisa Koch MRN: [id number] Date of Birth: 1924/03/30  Transition of Care [facility]) CM/SW [place], RN Phone Number: 02/10/2022, 9:00 AM  Clinical Narrative:    Holli Humbles pending   Expected Discharge Plan: [place] Barriers to Discharge: SNF Pending bed offer  Expected Discharge Plan and Services Expected Discharge Plan: [place]                                               Social Determinants of Health (SDOH) Interventions    Readmission Risk Interventions     No data to display

## 2022-02-10 NOTE — TOC Progression Note (Signed)
Transition of Care [facility]) - Progression Note    Patient Details  Name: Lisa Koch MRN: [id number] Date of Birth: [birth year]  Transition of Care [facility]) CM/SW [place], RN Phone Number: 02/10/2022, 9:57 AM  Clinical Narrative:    Ins auth  approved 12/16 - 24/4 with certification [id number]    Expected Discharge Plan: [place] Barriers to Discharge: SNF Pending bed offer  Expected Discharge Plan and Services Expected Discharge Plan: [place]                                               Social Determinants of Health (SDOH) Interventions    Readmission Risk Interventions     No data to display

## 2022-02-10 NOTE — Plan of Care (Signed)

## 2022-02-10 NOTE — TOC Progression Note (Signed)
Transition of Care [facility]) - Progression Note    Patient Details  Name: Lisa Koch MRN: [id number] Date of Birth: April 24, 1924  Transition of Care [facility]) CM/SW [place], RN Phone Number: 02/10/2022, 3:11 PM  Clinical Narrative:     Due to the patient having Phosphorus running and it will be so late this evening, she will dc tomorrow, I spoke to Jenny Reichmann the son and his is agreeable  Expected Discharge Plan: [place] Barriers to Discharge: SNF Pending bed offer  Expected Discharge Plan and Services Expected Discharge Plan: [place]         Expected Discharge Date: 02/10/22                                     Social Determinants of Health (SDOH) Interventions    Readmission Risk Interventions     No data to display

## 2022-02-10 NOTE — Progress Notes (Signed)
  Subjective: 3 Days Post-Op Procedure(s) (LRB): INTRAMEDULLARY (IM) NAIL INTERTROCHANTERIC (Right) Status post closed reduction anterior right shoulder dislocation with greater tuberosity fracture Patient reports pain as mild.   Patient is well, and has had no acute complaints or problems Plan is to go Skilled nursing facility after hospital stay. Negative for chest pain and shortness of breath Fever: no Gastrointestinal: Negative for nausea and vomiting  Objective: Vital signs in last 24 hours: Temp:  [97.8 F (36.6 C)-99.6 F (37.6 C)] 99.6 F (37.6 C) (11/27 2323) Pulse Rate:  [86-98] 91 (11/27 2323) Resp:  [16-18] 18 (11/27 2323) BP: (110-135)/(44-57) 135/57 (11/27 2323) SpO2:  [93 %-94 %] 94 % (11/27 2323)  Intake/Output from previous day:  Intake/Output Summary (Last 24 hours) at 02/10/2022 0707 Last data filed at 02/09/2022 1836 Gross per 24 hour  Intake 402.17 ml  Output --  Net 402.17 ml    Intake/Output this shift: No intake/output data recorded.  Labs: Recent Labs    02/08/22 0800 02/09/22 0523 02/09/22 1728 02/10/22 0619  HGB 7.0* 6.7* 8.5* 8.6*   Recent Labs    02/09/22 1728 02/10/22 0619  WBC 11.3* 8.1  RBC 2.63*  2.63* 2.61*  HCT 25.7* 25.4*  PLT 169 171   Recent Labs    02/09/22 0523 02/10/22 0619  NA 137 137  K 4.1 4.5  CL 111 111  CO2 22 23  BUN 26* 24*  CREATININE 0.87 0.66  GLUCOSE 112* 119*  CALCIUM 8.1* 8.2*   No results for input(s): "LABPT", "INR" in the last 72 hours.    EXAM General - Patient is Alert and Oriented with difficulty hearing Extremity - Neurovascular intact Dorsiflexion/Plantar flexion intact Compartment soft Dressing/Incision - clean, dry, no drainage Motor Function - intact, moving foot and toes well on exam.   Past Medical History:  Diagnosis Date   Anemia    Basal cell carcinoma    Benign neoplasm of colon    Diverticulosis    Hemorrhage of rectum and anus    Hx of adenomatous colonic  polyps    Hypertension    Inflammatory arthritis    Inflammatory polyarthropathy (HCC)    Mitral insufficiency    Psoriatic arthropathy (HCC)    Senile osteoporosis     Assessment/Plan: 3 Days Post-Op Procedure(s) (LRB): INTRAMEDULLARY (IM) NAIL INTERTROCHANTERIC (Right) Principal Problem:   Closed right hip fracture, initial encounter [facility]) Active Problems:   Hypertensive urgency   Closed fracture dislocation of right shoulder   Anemia associated with acute blood loss  Estimated body mass index is 24.22 kg/m as calculated from the following:   Height as of this encounter: 5' (1.524 m).   Weight as of this encounter: 56.2 kg. Advance diet Up with therapy  Acute blood loss anemia.  Received 1 unit transfused packed red blood cells yesterday.  Hemoglobin 8.6 this morning from 6.7 yesterday.  DVT Prophylaxis - Lovenox, Foot Pumps, and TED hose Weight-Bearing as tolerated to right leg.  She is to stay in the sling on the right arm.  Reche Dixon, PA-C Orthopaedic Surgery 02/10/2022, 7:07 AM

## 2022-02-10 NOTE — TOC Progression Note (Signed)
Transition of Care [facility]) - Progression Note    Patient Details  Name: Lisa Koch MRN: [id number] Date of Birth: February 05, 1925  Transition of Care [facility]) CM/SW [place], RN Phone Number: 02/10/2022, 12:09 PM  Clinical Narrative:     Spoke to the son Jenny Reichmann, Notified him that she will go to room 610 at [facility] today,., I let him know EMS will not provide a time that they pick up, he stated understanding  Expected Discharge Plan: Skilled Nursing Facility Barriers to Discharge: SNF Pending bed offer  Expected Discharge Plan and Services Expected Discharge Plan: [place]         Expected Discharge Date: 02/10/22                                     Social Determinants of Health (SDOH) Interventions    Readmission Risk Interventions     No data to display

## 2022-02-11 NOTE — Progress Notes (Signed)
No charge progress.  Patient was discharged yesterday.  Apparently her phosphorous infusion took long time and she was unable to go to rehab that late.  Patient remained stable and will be leaving in the morning. No acute changes were made. Discharge orders and summary refreshed with today's date as required by facility.

## 2022-02-11 NOTE — Plan of Care (Signed)

## 2022-02-11 NOTE — Progress Notes (Signed)
02/10/22 1550  PT Visit Information  Last PT Received On 02/10/22  Assistance Needed +2  History of Present Illness Pt is a 88 y/o F admitted on 02/06/22 after presenting to the ED with R hip pain & deformity following a fall. Pt found to have R hip fx. Pt underwent R IM nai placement. Pt also found to have anterior dislocation of R shoulder & fx of greater tuberosity of R humerus. Pt underwent closed reduction in the ED. PMH: HTN, GERD, RA. Per secure chat with Dr. Posey Pronto on 02/08/22, he cleared pt to use RW for mobility as long as RUE remained close to her side to minimize ROM.  Subjective Data  Subjective I wish they would have 2 people when they moved me  Patient Stated Goal get better  Precautions  Precautions Fall  Required Braces or Orthoses Sling  Restrictions  Weight Bearing Restrictions Yes  RUE Weight Bearing PWB  RUE Partial Weight Bearing Percentage or Pounds 50  RLE Weight Bearing WBAT  Other Position/Activity Restrictions TLSO for comfort PRN 2/2 T10 compression fx in October, ok to bear weight through Right forearm  Pain Assessment  Pain Assessment Faces  Faces Pain Scale 4  Pain Location RLE with mobility and standing  Pain Descriptors / Indicators Discomfort;Grimacing;Guarding  Pain Intervention(s) Monitored during session;Ice applied  Cognition  Arousal/Alertness Awake/alert  Behavior During Therapy WFL for tasks assessed/performed  Overall Cognitive Status Within Functional Limits for tasks assessed  General Comments Pt is able to follow single step commands  Bed Mobility  Overal bed mobility Needs Assistance  Bed Mobility Sit to Supine  Supine to sit Max assist;HOB elevated  Sit to supine Max assist  General bed mobility comments Utilized linen to turn and scoot pt to edge of bed  Transfers  Overall transfer level Needs assistance  Equipment used Hemi-walker  Transfers Sit to/from Stand  Sit to Stand Max assist  Stand pivot transfers Max assist  General  transfer comment Pt struggles with weight shifting forward for push off  Ambulation/Gait  Ambulation/Gait assistance Mod assist  Gait Distance (Feet) 2 Feet  Assistive device Hemi-walker;1 person hand held assist  Gait Pattern/deviations Decreased stance time - right;Decreased dorsiflexion - right;Decreased dorsiflexion - left;Decreased step length - left;Decreased step length - right;Decreased weight shift to right  General Gait Details Several attempts to advance forward with support of hemi-walker, pt struggles with supporting self through walker on Left and accepting weight through R LE  Gait velocity decreased  General Comments  General comments (skin integrity, edema, etc.) High fear of falling  Exercises  Exercises General Lower Extremity  General Exercises - Lower Extremity  Hip Flexion/Marching AAROM;Right;10 reps  Ankle Circles/Pumps AROM;Both;15 reps;Seated  Hip ABduction/ADduction AAROM;Right;5 reps  Long Arc Quad AROM;Both;10 reps;Seated  PT - End of Session  Equipment Utilized During Treatment Gait belt  Activity Tolerance Patient tolerated treatment well  Patient left in bed;with call bell/phone within reach;with bed alarm set;with nursing/sitter in room;with family/visitor present;with SCD's reapplied  Nurse Communication Mobility status   PT - Assessment/Plan  PT Plan Current plan remains appropriate  PT Visit Diagnosis Unsteadiness on feet (R26.81);Pain;Other abnormalities of gait and mobility (R26.89);Muscle weakness (generalized) (M62.81);Difficulty in walking, not elsewhere classified (R26.2)  Pain - Right/Left Right  Pain - part of body Hip  PT Frequency (ACUTE ONLY) BID  Follow Up Recommendations Skilled nursing-short term rehab (<3 hours/day)  Can patient physically be transported by private vehicle No  Assistance recommended at discharge Frequent or  constant Supervision/Assistance  Patient can return home with the following Two people to help with walking  and/or transfers;Two people to help with bathing/dressing/bathroom;Help with stairs or ramp for entrance;Assistance with feeding;Assistance with cooking/housework;Direct supervision/assist for financial management;Assist for transportation  PT equipment None recommended by PT  AM-PAC PT "6 Clicks" Mobility Outcome Measure (Version 2)  Help needed turning from your back to your side while in a flat bed without using bedrails? 2  Help needed moving from lying on your back to sitting on the side of a flat bed without using bedrails? 2  Help needed moving to and from a bed to a chair (including a wheelchair)? 2  Help needed standing up from a chair using your arms (e.g., wheelchair or bedside chair)? 2  Help needed to walk in hospital room? 1  Help needed climbing 3-5 steps with a railing?  1  6 Click Score 10  Consider Recommendation of Discharge To: CIR/SNF/LTACH  Progressive Mobility  What is the highest level of mobility based on the progressive mobility assessment? Level 3 (Stands with assist) - Balance while standing  and cannot march in place  Mobility Referral No  Activity Stood at bedside  PT Goal Progression  Progress towards PT goals Progressing toward goals  PT Time Calculation  PT Start Time (ACUTE ONLY) 1530  PT Stop Time (ACUTE ONLY) 1550  PT Time Calculation (min) (ACUTE ONLY) 20 min  PT General Charges  $$ ACUTE PT VISIT 1 Visit  PT Treatments  $Therapeutic Activity 8-22 mins   Pt progressing well, continues to struggle with accepting weight through R LE in standing which will improve with confidence. Pt awaiting d/c to [facility] once IV is complete.   Mikel Cella, PTA

## 2022-02-11 NOTE — Plan of Care (Signed)
  Problem: Activity: Goal: Risk for activity intolerance will decrease Outcome: Progressing   Problem: Pain Managment: Goal: General experience of comfort will improve Outcome: Progressing   Problem: Safety: Goal: Ability to remain free from injury will improve Outcome: Progressing   

## 2022-02-11 NOTE — Progress Notes (Signed)
Report called to [facility], Therapist, sports, at [employer].  Awaiting EMS transport.

## 2022-02-11 NOTE — Progress Notes (Signed)
  Subjective: 4 Days Post-Op Procedure(s) (LRB): INTRAMEDULLARY (IM) NAIL INTERTROCHANTERIC (Right) Status post closed reduction anterior right shoulder dislocation with greater tuberosity fracture Patient reports pain as mild.   Patient is well, and has had no acute complaints or problems Plan is to go Skilled nursing facility after hospital stay. Negative for chest pain and shortness of breath Fever: no Gastrointestinal: Negative for nausea and vomiting  Objective: Vital signs in last 24 hours: Temp:  [98.5 F (36.9 C)-98.8 F (37.1 C)] 98.8 F (37.1 C) (11/28 2336) Pulse Rate:  [97-98] 98 (11/28 2336) Resp:  [16-18] 16 (11/28 2336) BP: (146-175)/(65-83) 175/83 (11/28 2336) SpO2:  [95 %-97 %] 95 % (11/28 2336)  Intake/Output from previous day:  Intake/Output Summary (Last 24 hours) at 02/11/2022 0726 Last data filed at 02/10/2022 1442 Gross per 24 hour  Intake 240 ml  Output 250 ml  Net -10 ml    Intake/Output this shift: No intake/output data recorded.  Labs: Recent Labs    02/08/22 0800 02/09/22 0523 02/09/22 1728 02/10/22 0619  HGB 7.0* 6.7* 8.5* 8.6*   Recent Labs    02/09/22 1728 02/10/22 0619  WBC 11.3* 8.1  RBC 2.63*  2.63* 2.61*  HCT 25.7* 25.4*  PLT 169 171   Recent Labs    02/09/22 0523 02/10/22 0619  NA 137 137  K 4.1 4.5  CL 111 111  CO2 22 23  BUN 26* 24*  CREATININE 0.87 0.66  GLUCOSE 112* 119*  CALCIUM 8.1* 8.2*   No results for input(s): "LABPT", "INR" in the last 72 hours.    EXAM General - Patient is Alert and Oriented with difficulty hearing Extremity - Neurovascular intact Dorsiflexion/Plantar flexion intact Compartment soft Dressing/Incision - clean, dry, no drainage Motor Function - intact, moving foot and toes well on exam.  Bed to chair with physical therapy  Past Medical History:  Diagnosis Date   Anemia    Basal cell carcinoma    Benign neoplasm of colon    Diverticulosis    Hemorrhage of rectum and anus     Hx of adenomatous colonic polyps    Hypertension    Inflammatory arthritis    Inflammatory polyarthropathy (HCC)    Mitral insufficiency    Psoriatic arthropathy (HCC)    Senile osteoporosis     Assessment/Plan: 4 Days Post-Op Procedure(s) (LRB): INTRAMEDULLARY (IM) NAIL INTERTROCHANTERIC (Right) Principal Problem:   Closed right hip fracture, initial encounter [facility]) Active Problems:   Hypertensive urgency   Closed fracture dislocation of right shoulder   Anemia associated with acute blood loss  Estimated body mass index is 24.22 kg/m as calculated from the following:   Height as of this encounter: 5' (1.524 m).   Weight as of this encounter: 56.2 kg. Advance diet Up with therapy  Acute blood loss anemia.  Received 1 unit transfused packed red blood cells Monday.  Hemoglobin 8.6 and more stable  DVT Prophylaxis - Lovenox, Foot Pumps, and TED hose Weight-Bearing as tolerated to right leg.  She is to stay in the sling on the right arm.  Reche Dixon, PA-C Orthopaedic Surgery 02/11/2022, 7:26 AM

## 2022-02-11 NOTE — TOC Progression Note (Signed)
Transition of Care [facility]) - Progression Note    Patient Details  Name: Lisa Koch MRN: [id number] Date of Birth: May 15, 1924  Transition of Care [facility]) CM/SW [place], RN Phone Number: 02/11/2022, 9:51 AM  Clinical Narrative:     Called EMS to set up transport to go to [facility] to make     Expected Discharge Plan: [place] Barriers to Discharge: SNF Pending bed offer  Expected Discharge Plan and Services Expected Discharge Plan: [place]         Expected Discharge Date: 02/11/22                                     Social Determinants of Health (SDOH) Interventions    Readmission Risk Interventions     No data to display

## 2022-02-11 NOTE — Progress Notes (Signed)
Physical Therapy Treatment Patient Details Name: Lisa Koch MRN: [id number] DOB: 1924/06/08 Today's Date: 02/11/2022   History of Present Illness Pt is a 88 y/o F admitted on 02/06/22 after presenting to the ED with R hip pain & deformity following a fall. Pt found to have R hip fx. Pt underwent R IM nai placement. Pt also found to have anterior dislocation of R shoulder & fx of greater tuberosity of R humerus. Pt underwent closed reduction in the ED. PMH: HTN, GERD, RA. Per secure chat with Dr. Posey Pronto on 02/08/22, he cleared pt to use RW for mobility as long as RUE remained close to her side to minimize ROM.    PT Comments    Pt less fearful of falling this session. Appears more confident with mobility. Pt able to transfer supine to sit with ModA and HOB raised. Good static sitting balance, no dizziness noted. Sit to stand at hemi-walker with ModA. Pt able to turn and take several steps backwards to sit in chair. Pt appears ready to transition to SNF for continued PT services.   Recommendations for follow up therapy are one component of a multi-disciplinary discharge planning process, led by the attending physician.  Recommendations may be updated based on patient status, additional functional criteria and insurance authorization.  Follow Up Recommendations  Skilled nursing-short term rehab (<3 hours/day) Can patient physically be transported by private vehicle: No   Assistance Recommended at Discharge Frequent or constant Supervision/Assistance  Patient can return home with the following Two people to help with walking and/or transfers;Two people to help with bathing/dressing/bathroom;Help with stairs or ramp for entrance;Assistance with feeding;Assistance with cooking/housework;Direct supervision/assist for financial management;Assist for transportation   Equipment Recommendations  None recommended by PT    Recommendations for Other Services       Precautions / Restrictions  Precautions Precautions: Fall Required Braces or Orthoses: Sling Restrictions Weight Bearing Restrictions: Yes RUE Weight Bearing: Partial weight bearing RUE Partial Weight Bearing Percentage or Pounds: 50 RLE Weight Bearing: Weight bearing as tolerated Other Position/Activity Restrictions: TLSO for comfort PRN 2/2 T10 compression fx in October, ok to bear weight through Right forearm     Mobility  Bed Mobility Overal bed mobility: Needs Assistance Bed Mobility: Sit to Supine     Supine to sit: Mod assist, HOB elevated     General bed mobility comments: Pt required less physical assist to transition to edge of bed this session    Transfers Overall transfer level: Needs assistance Equipment used: Hemi-walker Transfers: Sit to/from Stand Sit to Stand: Mod assist           General transfer comment: vc's for upright posture and use of hemi-walker for support.    Ambulation/Gait               General Gait Details: Pt demonstrated ability to step backwards ~2 feet with ModA and use of hemi-walker prior to sitting in chair. Good weight shifting onto R LE with vc's.   Stairs             Wheelchair Mobility    Modified Rankin (Stroke Patients Only)       Balance Overall balance assessment: Needs assistance Sitting-balance support: Feet supported, No upper extremity supported Sitting balance-Leahy Scale: Good     Standing balance support: Single extremity supported, Reliant on assistive device for balance Standing balance-Leahy Scale: Fair Standing balance comment: Pt able to stand with support of hemi-walker and CG/MinA for 1 minute  Cognition Arousal/Alertness: Awake/alert Behavior During Therapy: WFL for tasks assessed/performed Overall Cognitive Status: Within Functional Limits for tasks assessed                                 General Comments: Pt is able to follow single step commands         Exercises General Exercises - Lower Extremity Ankle Circles/Pumps: AROM, Both, 10 reps Heel Slides: AAROM, Right, 10 reps Hip ABduction/ADduction: AAROM, Right, 10 reps    General Comments        Pertinent Vitals/Pain Pain Assessment Pain Assessment: Faces Faces Pain Scale: Hurts little more Pain Location: RLE with mobility and standing Pain Descriptors / Indicators: Discomfort, Grimacing, Guarding Pain Intervention(s): Monitored during session, Ice applied    Home Living                          Prior Function            PT Goals (current goals can now be found in the care plan section) Acute Rehab PT Goals Patient Stated Goal: get better Progress towards PT goals: Progressing toward goals    Frequency    BID      PT Plan Current plan remains appropriate    Co-evaluation              AM-PAC PT "6 Clicks" Mobility   Outcome Measure  Help needed turning from your back to your side while in a flat bed without using bedrails?: A Lot Help needed moving from lying on your back to sitting on the side of a flat bed without using bedrails?: A Lot Help needed moving to and from a bed to a chair (including a wheelchair)?: A Lot Help needed standing up from a chair using your arms (e.g., wheelchair or bedside chair)?: A Lot Help needed to walk in hospital room?: Total Help needed climbing 3-5 steps with a railing? : Total 6 Click Score: 10    End of Session Equipment Utilized During Treatment: Gait belt Activity Tolerance: Patient tolerated treatment well Patient left: in bed;with call bell/phone within reach;with bed alarm set;with nursing/sitter in room;with family/visitor present;with SCD's reapplied Nurse Communication: Mobility status PT Visit Diagnosis: Unsteadiness on feet (R26.81);Pain;Other abnormalities of gait and mobility (R26.89);Muscle weakness (generalized) (M62.81);Difficulty in walking, not elsewhere classified (R26.2) Pain -  Right/Left: Right Pain - part of body: Hip     Time: 0950-1020 PT Time Calculation (min) (ACUTE ONLY): 30 min  Charges:  $Therapeutic Exercise: 8-22 mins $Therapeutic Activity: 8-22 mins                    Mikel Cella, PTA    Josie Dixon 02/11/2022, 11:30 AM

## 2022-02-20 ENCOUNTER — Ambulatory Visit: Payer: Medicare HMO | Admitting: Orthopedic Surgery

## 2022-03-05 NOTE — Progress Notes (Deleted)
Referring Physician:  Tracie Harrier, MD [address] [facility] [place],  [place] [address]  Primary Physician:  Tracie Harrier, MD  History of Present Illness: 03/05/2022 [name] was last seen by me on 01/16/22 for T10 compression fracture s/p fall on 12/17/21.   Xrays showed that T10 fracture has progressed slightly since previous CT scan. She had no pain and exam showed she was neurologically intact. I recommended that she wear her TLSO brace when up and walking. No bending, twisting, or lifting.   She is here for follow up and repeat xrays.       seen for hospital consult by Dr. Izora Ribas on 12/17/21 for T10 acute compression fracture. Also with known chronic T4 compression fracture. She was admitted for hyponatremia, weakness, fatigue, and confusion. He recommended TLSO brace and she is here for follow up.   She is not wearing her brace. She has no back pain. No leg pain. No numbness, tingling, or weakness. She is taking prn tylenol and hasn't taken that in last 10 days or so.   She is very hard of hearing. Her son and daughter in law help with her history.   Review of Systems:  A 10 point review of systems is negative, except for the pertinent positives and negatives detailed in the HPI.  Past Medical History: Past Medical History:  Diagnosis Date   Anemia    Basal cell carcinoma    Benign neoplasm of colon    Diverticulosis    Hemorrhage of rectum and anus    Hx of adenomatous colonic polyps    Hypertension    Inflammatory arthritis    Inflammatory polyarthropathy (HCC)    Mitral insufficiency    Psoriatic arthropathy ([place])    Senile osteoporosis     Past Surgical History: Past Surgical History:  Procedure Laterality Date   ABDOMINAL HYSTERECTOMY     BREAST BIOPSY Left 2012   core - neg   BREAST BIOPSY Left 2013   excisional - neg   BREAST EXCISIONAL BIOPSY     BREAST EXCISIONAL BIOPSY     COLONOSCOPY      ESOPHAGOGASTRODUODENOSCOPY     INTRAMEDULLARY (IM) NAIL INTERTROCHANTERIC Right 02/07/2022   Procedure: INTRAMEDULLARY (IM) NAIL INTERTROCHANTERIC;  Surgeon: Leim Fabry, MD;  Location: [facility] ORS;  Service: Orthopedics;  Laterality: Right;   SKIN BIOPSY      Allergies: Allergies as of 03/17/2022 - Review Complete 02/07/2022  Allergen Reaction Noted   Nabumetone Swelling 08/17/2013   Clindamycin Other (See Comments) 01/08/2016    Medications: Outpatient Encounter Medications as of 03/17/2022  Medication Sig   acetaminophen (TYLENOL) 325 MG tablet Take 1-2 tablets (325-650 mg total) by mouth every 6 (six) hours as needed for mild pain (pain score 1-3 or temp > 100.5).   aspirin 81 MG EC tablet Take 81 mg by mouth daily.   bisacodyl (DULCOLAX) 10 MG suppository Place 1 suppository (10 mg total) rectally daily as needed for moderate constipation.   Calcium Carb-Cholecalciferol (CALCIUM HIGH POTENCY/VITAMIN D) 600-5 MG-MCG TABS Take 1 tablet by mouth 2 (two) times daily with a meal.   CALCIUM PO Take by mouth daily.   diclofenac Sodium (VOLTAREN) 1 % GEL Apply 4 g topically 4 (four) times daily as needed (MUSCLE/JOINT PAIN).   enoxaparin (LOVENOX) 40 MG/0.4ML injection Inject 0.4 mLs (40 mg total) into the skin daily for 14 days.   feeding supplement (ENSURE ENLIVE / ENSURE PLUS) LIQD Take 237 mLs by mouth 2 (  two) times daily between meals.   ferrous sulfate 325 (65 FE) MG tablet Take 1 tablet (325 mg total) by mouth 2 (two) times daily with a meal.   folic acid (FOLVITE) 1 MG tablet Take 2 mg by mouth daily.   lisinopril (ZESTRIL) 40 MG tablet Take 40 mg by mouth daily.   loratadine (CLARITIN) 10 MG tablet Take 10 mg by mouth daily.   methocarbamol (ROBAXIN) 500 MG tablet Take 1 tablet (500 mg total) by mouth every 6 (six) hours as needed for muscle spasms.   methotrexate (RHEUMATREX) 2.5 MG tablet Take 10 mg by mouth once a week. Every Sunday   Multiple Vitamin (MULTIVITAMIN) capsule Take 1  capsule by mouth daily.   Multiple Vitamins-Minerals (PRESERVISION AREDS PO) Take 2 tablets by mouth daily.   omeprazole (PRILOSEC) 10 MG capsule Take 10 mg by mouth daily.   oxyCODONE (OXY IR/ROXICODONE) 5 MG immediate release tablet Take 0.5-1 tablets (2.5-5 mg total) by mouth every 4 (four) hours as needed for moderate pain (pain score 4-6).   Potassium Chloride ER 20 MEQ TBCR Take 20 mEq by mouth daily.   senna-docusate (SENOKOT-S) 8.6-50 MG tablet Take 2 tablets by mouth 2 (two) times daily. Until stooling regularly   traMADol (ULTRAM) 50 MG tablet Take 1 tablet (50 mg total) by mouth every 6 (six) hours as needed for moderate pain.   verapamil (CALAN-SR) 240 MG CR tablet Take 240 mg by mouth daily.   No facility-administered encounter medications on file as of 03/17/2022.    Social History: Social History   Tobacco Use   Smoking status: Never   Smokeless tobacco: Never  Substance Use Topics   Alcohol use: Never   Drug use: Never    Family Medical History: Family History  Problem Relation Age of Onset   Breast cancer Neg Hx     Physical Examination: There were no vitals filed for this visit.  General: Patient is well developed, well nourished, calm, collected, and in no apparent distress. Attention to examination is appropriate.  Respiratory: Patient is breathing without any difficulty.   NEUROLOGICAL:     Awake, alert, oriented to person, place, and time.  Speech is clear and fluent. Fund of knowledge is appropriate. Very hard of hearing.   Cranial Nerves: Pupils equal round and reactive to light.  Facial tone is symmetric.  Facial sensation is symmetric.  ROM TL spine not tested.  No tenderness TL junction.   No abnormal lesions on exposed skin.   Strength: Side Biceps Triceps Deltoid Interossei Grip Wrist Ext. Wrist Flex.  R '5 5 5 5 5 5 5  '$ L '5 5 5 5 5 5 5   '$ Side Iliopsoas Quads Hamstring PF DF EHL  R '5 5 5 5 5 5  '$ L '5 5 5 5 5 5   '$ Reflexes are 2+ and  symmetric at the biceps, triceps, brachioradialis, patella and achilles.   Hoffman's is absent.  Clonus is not present.   Bilateral upper and lower extremity sensation is intact to light touch.     Gait not tested.      Medical Decision Making  Imaging:   Thoracic xrays dated ***:  ***   I have personally reviewed the images and agree with the above interpretation.  Assessment and Plan: Ms. Drinkard is a pleasant 88 y.o. female with improvement in her pain from T10 compression fracture on 12/17/21. She has not worn brace at all. She has no back pain. No leg pain.  No numbness, tingling, or weakness.   No new imaging to review. CT from the hospital showed acute T10 compression fracture.   Treatment options discussed with patient and her family. Following plan made:   - She will get thoracic xrays today. Will call her daughter in law Freda Munro with results (okay per patient). [phone] - Recommend she wear TLSO brace when up and walking.  - No bending, twisting, or lifting.  - BP was elevated, it was 168/82. Recheck was 170/83. No symptoms of chest pain, shortness of breath, blurry vision, or headaches. She checks BP at home and it generally runs normal. Will recheck at home and call PCP if not improved. If she develops CP, SOB, blurry vision, or headaches, then she will go to ED. Reviewed with her son and daughter in law.  - Follow up in 4 weeks with repeat AP/lat thoracic xrays.   I spent a total of 30 minutes in face-to-face and non-face-to-face activities related to this patient's care toda including review of outside records, review of imaging, review of symptoms, physical exam, discussion of differential diagnosis, discussion of treatment options, and documentation.   Thank you for involving me in the care of this patient.   ADDENDUM 01/20/22:  Xrays of thoracic spine dated 01/16/22:  FINDINGS: Thoracic spine numbered as per prior CT. Diffuse osteopenia and degenerative change.  Progressive compression of the previously identified T10 compression fracture noted. Stable probable chronic T4 compression fracture. Old left rib fractures again noted. No new fractures are identified. Bibasilar subsegmental atelectasis. Prominent gallstone again noted.   IMPRESSION: Progressive compression of the previously identified T10 compression fracture. Stable probable chronic T4 compression fracture.     Electronically Signed   By: Marcello Moores  Register M.D.   On: 01/20/2022 07:14  I have personally reviewed the images and agree with the above interpretation.  Xrays show that T10 fracture has progressed slightly since CT scan. As above, she has no pain and exam shows she is neurologically intact.   I spoke with Freda Munro (daughter in law) regarding above xray results. I still recommend that she wear her TLSO brace when up and walking. No bending, twisting, or lifting.   Keep scheduled follow up in 4 weeks with repeat xrays. Call if any concerns prior to this.    Geronimo Boot PA-C Dept. of Neurosurgery

## 2022-03-17 ENCOUNTER — Ambulatory Visit: Payer: Medicare HMO | Admitting: Orthopedic Surgery

## 2022-03-26 NOTE — Progress Notes (Signed)
Referring Physician:  Tracie Harrier, MD [address] [facility] [place],   [address]  Primary Physician:  Tracie Harrier, MD  History of Present Illness: 04/07/2022 [name] was last seen by me on 01/16/22 for T10 compression fracture s/p fall on 12/17/21.   Xrays at last visit showed that T10 fracture has progressed slightly since previous CT scan. She had no pain and exam showed she was neurologically intact. I recommended that she wear her TLSO brace when up and walking. No bending, twisting, or lifting.   She had a fall on 02/07/22 with right hip fracture and right shoulder fracture/dislocation. She had IM nailing of right femur on 02/07/22. Non operative treatment of her right shoulder.   She is here for follow up and repeat xrays.   She has no pain in her mid back. No numbness or tingling. She is doing well regarding her hip. Just started PT for her shoulder. Still with some right shoulder pain. No radicular pain in cervical, thoracic, or lumbar spine.   She is at [facility]. Transferring to assisted living on Monday.   She talked to PCP (Hande) about her blood pressure- he is fine if it is elevated at doctor visits.   She is very hard of hearing. Her son and daughter in law help with her history.   Review of Systems:  A 10 point review of systems is negative, except for the pertinent positives and negatives detailed in the HPI.  Past Medical History: Past Medical History:  Diagnosis Date   Anemia    Basal cell carcinoma    Benign neoplasm of colon    Diverticulosis    Hemorrhage of rectum and anus    Hx of adenomatous colonic polyps    Hypertension    Inflammatory arthritis    Inflammatory polyarthropathy (HCC)    Mitral insufficiency    Psoriatic arthropathy ([place])    Senile osteoporosis     Past Surgical History: Past Surgical History:  Procedure Laterality Date   ABDOMINAL HYSTERECTOMY     BREAST BIOPSY Left 2012   core - neg    BREAST BIOPSY Left 2013   excisional - neg   BREAST EXCISIONAL BIOPSY     BREAST EXCISIONAL BIOPSY     COLONOSCOPY     ESOPHAGOGASTRODUODENOSCOPY     INTRAMEDULLARY (IM) NAIL INTERTROCHANTERIC Right 02/07/2022   Procedure: INTRAMEDULLARY (IM) NAIL INTERTROCHANTERIC;  Surgeon: Leim Fabry, MD;  Location: [facility] ORS;  Service: Orthopedics;  Laterality: Right;   SKIN BIOPSY      Allergies: Allergies as of 04/07/2022 - Review Complete 04/07/2022  Allergen Reaction Noted   Nabumetone Swelling 08/17/2013   Clindamycin Other (See Comments) 01/08/2016    Medications: Outpatient Encounter Medications as of 04/07/2022  Medication Sig   acetaminophen (TYLENOL) 325 MG tablet Take 1-2 tablets (325-650 mg total) by mouth every 6 (six) hours as needed for mild pain (pain score 1-3 or temp > 100.5).   aspirin 81 MG EC tablet Take 81 mg by mouth daily.   Calcium Carb-Cholecalciferol (CALCIUM HIGH POTENCY/VITAMIN D) 600-5 MG-MCG TABS Take 1 tablet by mouth 2 (two) times daily with a meal.   diclofenac Sodium (VOLTAREN) 1 % GEL Apply 4 g topically 4 (four) times daily as needed (MUSCLE/JOINT PAIN).   feeding supplement (ENSURE ENLIVE / ENSURE PLUS) LIQD Take 237 mLs by mouth 2 (two) times daily between meals.   ferrous sulfate 325 (65 FE) MG tablet Take 1 tablet (325 mg total) by  mouth 2 (two) times daily with a meal.   folic acid (FOLVITE) 1 MG tablet Take 2 mg by mouth daily.   lisinopril (ZESTRIL) 40 MG tablet Take 40 mg by mouth daily.   loratadine (CLARITIN) 10 MG tablet Take 10 mg by mouth daily.   methocarbamol (ROBAXIN) 500 MG tablet Take 1 tablet (500 mg total) by mouth every 6 (six) hours as needed for muscle spasms.   methotrexate (RHEUMATREX) 2.5 MG tablet Take 10 mg by mouth once a week. Every Sunday   Multiple Vitamin (MULTIVITAMIN) capsule Take 1 capsule by mouth daily.   Multiple Vitamins-Minerals (PRESERVISION AREDS PO) Take 2 tablets by mouth daily.   omeprazole (PRILOSEC) 10 MG  capsule Take 10 mg by mouth daily.   Potassium Chloride ER 20 MEQ TBCR Take 20 mEq by mouth daily.   senna-docusate (SENOKOT-S) 8.6-50 MG tablet Take 2 tablets by mouth 2 (two) times daily. Until stooling regularly   verapamil (CALAN-SR) 240 MG CR tablet Take 240 mg by mouth daily.   [DISCONTINUED] bisacodyl (DULCOLAX) 10 MG suppository Place 1 suppository (10 mg total) rectally daily as needed for moderate constipation.   [DISCONTINUED] CALCIUM PO Take by mouth daily.   [DISCONTINUED] enoxaparin (LOVENOX) 40 MG/0.4ML injection Inject 0.4 mLs (40 mg total) into the skin daily for 14 days.   [DISCONTINUED] oxyCODONE (OXY IR/ROXICODONE) 5 MG immediate release tablet Take 0.5-1 tablets (2.5-5 mg total) by mouth every 4 (four) hours as needed for moderate pain (pain score 4-6).   [DISCONTINUED] traMADol (ULTRAM) 50 MG tablet Take 1 tablet (50 mg total) by mouth every 6 (six) hours as needed for moderate pain.   No facility-administered encounter medications on file as of 04/07/2022.    Social History: Social History   Tobacco Use   Smoking status: Never   Smokeless tobacco: Never  Substance Use Topics   Alcohol use: Never   Drug use: Never    Family Medical History: Family History  Problem Relation Age of Onset   Breast cancer Neg Hx     Physical Examination: Vitals:   04/07/22 1334  BP: (!) 157/74  Pulse: 73    General: Patient is well developed, well nourished, calm, collected, and in no apparent distress. Attention to examination is appropriate.  Respiratory: Patient is breathing without any difficulty.   NEUROLOGICAL:     Awake, alert, oriented to person, place, and time.  Speech is clear and fluent. Fund of knowledge is appropriate. Very hard of hearing.   Cranial Nerves: Pupils equal round and reactive to light.  Facial tone is symmetric.    ROM TL spine not tested.  No tenderness TL junction.   No abnormal lesions on exposed skin.   Strength: Side Biceps  Triceps Deltoid Interossei Grip Wrist Ext. Wrist Flex.  R '5 5 5 5 5 5 5  '$ L '5 5 5 5 5 5 5   '$ Side Iliopsoas Quads Hamstring PF DF EHL  R '5 5 5 5 5 5  '$ L '5 5 5 5 5 5   '$ Hoffman's is absent.  Clonus is not present.   Bilateral upper and lower extremity sensation is intact to light touch.     Gait not tested.      Medical Decision Making  Imaging: Thoracic xrays dated 04/07/22:  Stable T10 compression fracture in comparison to previous imaging on 01/16/22.    Radiology report for above images not available.   Assessment and Plan: Ms. Imm is a pleasant 89 y.o. female with  improvement in her pain from T10 compression fracture on 12/17/21. She has no back pain. No leg pain. No numbness, tingling, or weakness.   Xrays from today show a stable T10 compression fracture.   Treatment options discussed with patient and her family. Following plan made:   - She can be released to regular activity. Would still be careful with bending, twisting, lifting.  - BP today was 157/74- family states this was discussed with Dr. Ginette Pitman previously and he is okay with this. No symptoms.  - She will follow up with me prn.   I spent a total of 20 minutes in face-to-face and non-face-to-face activities related to this patient's care toda including review of outside records, review of imaging, review of symptoms, physical exam, discussion of differential diagnosis, discussion of treatment options, and documentation.    Geronimo Boot PA-C Dept. of Neurosurgery

## 2022-04-07 ENCOUNTER — Ambulatory Visit (INDEPENDENT_AMBULATORY_CARE_PROVIDER_SITE_OTHER): Payer: Medicare HMO | Admitting: Orthopedic Surgery

## 2022-04-07 ENCOUNTER — Ambulatory Visit
Admission: RE | Admit: 2022-04-07 | Discharge: 2022-04-07 | Disposition: A | Discharge status: Home / Self Care | Payer: Medicare HMO | Source: Ambulatory Visit | Attending: Orthopedic Surgery | Admitting: Orthopedic Surgery

## 2022-04-07 ENCOUNTER — Ambulatory Visit
Admission: RE | Admit: 2022-04-07 | Discharge: 2022-04-07 | Disposition: A | Discharge status: Home / Self Care | Payer: Medicare HMO | Attending: Orthopedic Surgery | Admitting: Orthopedic Surgery

## 2022-04-07 ENCOUNTER — Encounter: Payer: Self-pay | Admitting: Orthopedic Surgery

## 2022-04-07 VITALS — BP 157/74 | HR 73 | Wt 123.0 lb

## 2022-04-07 DIAGNOSIS — S22070A Wedge compression fracture of T9-T10 vertebra, initial encounter for closed fracture: Secondary | ICD-10-CM

## 2022-04-07 DIAGNOSIS — S22070D Wedge compression fracture of T9-T10 vertebra, subsequent encounter for fracture with routine healing: Secondary | ICD-10-CM | POA: Diagnosis not present

## 2022-04-08 ENCOUNTER — Telehealth: Payer: Self-pay | Admitting: Orthopedic Surgery

## 2022-04-08 NOTE — Telephone Encounter (Signed)
Created in error

## 2022-07-18 ENCOUNTER — Emergency Department
Admission: EM | Admit: 2022-07-18 | Discharge: 2022-07-18 | Disposition: A | Discharge status: Home / Self Care | Payer: Medicare HMO | Attending: Emergency Medicine | Admitting: Emergency Medicine

## 2022-07-18 ENCOUNTER — Emergency Department: Payer: Medicare HMO

## 2022-07-18 ENCOUNTER — Other Ambulatory Visit: Payer: Self-pay

## 2022-07-18 DIAGNOSIS — S300XXA Contusion of lower back and pelvis, initial encounter: Secondary | ICD-10-CM | POA: Diagnosis not present

## 2022-07-18 DIAGNOSIS — W1830XA Fall on same level, unspecified, initial encounter: Secondary | ICD-10-CM | POA: Diagnosis not present

## 2022-07-18 DIAGNOSIS — M533 Sacrococcygeal disorders, not elsewhere classified: Secondary | ICD-10-CM | POA: Diagnosis present

## 2022-07-18 DIAGNOSIS — I10 Essential (primary) hypertension: Secondary | ICD-10-CM | POA: Diagnosis not present

## 2022-07-18 MED ORDER — ACETAMINOPHEN 325 MG PO TABS
650.0000 mg | ORAL_TABLET | Freq: Once | ORAL | Status: AC
  Administered 2022-07-18: 650 mg via ORAL
  Filled 2022-07-18: qty 2

## 2022-07-18 NOTE — ED Provider Notes (Signed)
[facility] Provider Note    Event Date/Time   First MD Initiated Contact with Patient 07/18/22 [phone]     (approximate)   History   Fall   HPI  OMUNIQUE OSTRAND is a 89 y.o. female with a history of hypertension, GERD, rheumatoid arthritis, and tricuspid regurgitation who presents with sacral area pain after a fall.  Per EMS, the fall was unwitnessed by facility staff although the patient stated that she slid down.  She denies hitting her head.  She denies any pain in her neck or upper back.  She denies any acute hip pain.  I reviewed the past medical records.  Per the hospitalist discharge summary from 02/11/2022 the patient was admitted at that time with a right hip fracture treated with ORIF.   Physical Exam   Triage Vital Signs: ED Triage Vitals  Enc Vitals Group     BP 07/18/22 0946 (!) 173/126     Pulse Rate 07/18/22 0946 93     Resp 07/18/22 0946 16     Temp 07/18/22 0946 98.6 F (37 C)     Temp Source 07/18/22 0946 Oral     SpO2 07/18/22 0946 93 %     Weight --      Height --      Head Circumference --      Peak Flow --      Pain Score 07/18/22 0947 5     Pain Loc --      Pain Edu? --      Excl. in GC? --     Most recent vital signs: Vitals:   07/18/22 0946  BP: (!) 173/126  Pulse: 93  Resp: 16  Temp: 98.6 F (37 C)  SpO2: 93%     General: Awake, no distress.  CV:  Good peripheral perfusion.  Resp:  Normal effort.  Abd:  No distention.  Other:  Full range of motion of bilateral hips and knees.  Cervical, thoracic, and upper lumbar spine nontender.  Mild sacral area tenderness.  Mild tenderness to the coccyx with no step-off or crepitus.  No visible bruising.  Motor intact in all extremities.  Normal coordination.  Normal speech.   ED Results / Procedures / Treatments   Labs (all labs ordered are listed, but only abnormal results are displayed) Labs Reviewed - No data to display   EKG     RADIOLOGY  XR pelvis: I  independently viewed and interpreted the images; no acute fracture  CT lumbar spine: No acute fracture    PROCEDURES:  Critical Care performed: No  Procedures   MEDICATIONS ORDERED IN ED: Medications  acetaminophen (TYLENOL) tablet 650 mg (has no administration in time range)     IMPRESSION / MDM / ASSESSMENT AND PLAN / ED COURSE  I reviewed the triage vital signs and the nursing notes.  89 year old female with PMH as noted above presents with sacral/coccygeal area pain after a fall.  The patient denies hitting her head or neck and no other pain.  Differential diagnosis includes, but is not limited to, lumbosacral or coccygeal fracture versus contusion.  Will obtain CT of the lumbar spine, x-rays of the pelvis, and reassess.  There is no indication for imaging of the brain or C-spine.  Patient's presentation is most consistent with acute complicated illness / injury requiring diagnostic workup.  ----------------------------------------- 12:08 PM on 07/18/2022 -----------------------------------------  Imaging is negative.  The patient continues to appear comfortable.  She is stable for  discharge at this time.  I counseled her and her family members on the results of the workup.  I gave him strict return precautions and they expressed understanding.  FINAL CLINICAL IMPRESSION(S) / ED DIAGNOSES   Final diagnoses:  Contusion of coccyx, initial encounter     Rx / DC Orders   ED Discharge Orders     None        Note:  This document was prepared using Dragon voice recognition software and may include unintentional dictation errors.    Dionne Bucy, MD 07/18/22 1209

## 2022-07-18 NOTE — ED Triage Notes (Signed)
Pt brought in via ems due to unwitnessed fall. Pt co of sacral pain. Pt is A&Ox4.

## 2022-07-18 NOTE — Discharge Instructions (Addendum)
Lisa Koch had X-rays and CT scans which do not show any signs of a fracture.  She may receive Tylenol 650 mg every 6 hours as needed for pain.  She should return to the ER for new, worsening, or persistent severe pain, any weakness or numbness in the legs, inability to walk, or or any other new or worsening symptoms that are concerning.

## 2023-08-06 ENCOUNTER — Other Ambulatory Visit
Admission: RE | Admit: 2023-08-06 | Discharge: 2023-08-06 | Disposition: A | Discharge status: Home / Self Care | Attending: Student | Admitting: Student

## 2023-08-06 DIAGNOSIS — R238 Other skin changes: Secondary | ICD-10-CM | POA: Insufficient documentation

## 2023-08-06 DIAGNOSIS — M7989 Other specified soft tissue disorders: Secondary | ICD-10-CM | POA: Diagnosis present

## 2023-08-06 LAB — D-DIMER, QUANTITATIVE: D-Dimer, Quant: 0.58 ug{FEU}/mL — ABNORMAL HIGH (ref 0.00–0.50)

## 2024-02-15 IMAGING — MG MM DIGITAL SCREENING BILAT W/ TOMO AND CAD
6 of 10 series · 6 of 30 positions shown · non-contrast
Comparison: Previous exam(s).

CLINICAL DATA: Screening.

EXAM:
DIGITAL SCREENING BILATERAL MAMMOGRAM WITH TOMOSYNTHESIS AND CAD
TECHNIQUE: Bilateral screening digital craniocaudal and mediolateral oblique
mammograms were obtained. Bilateral screening digital breast
tomosynthesis was performed. The images were evaluated with
computer-aided detection.

[R CC synth-2D]
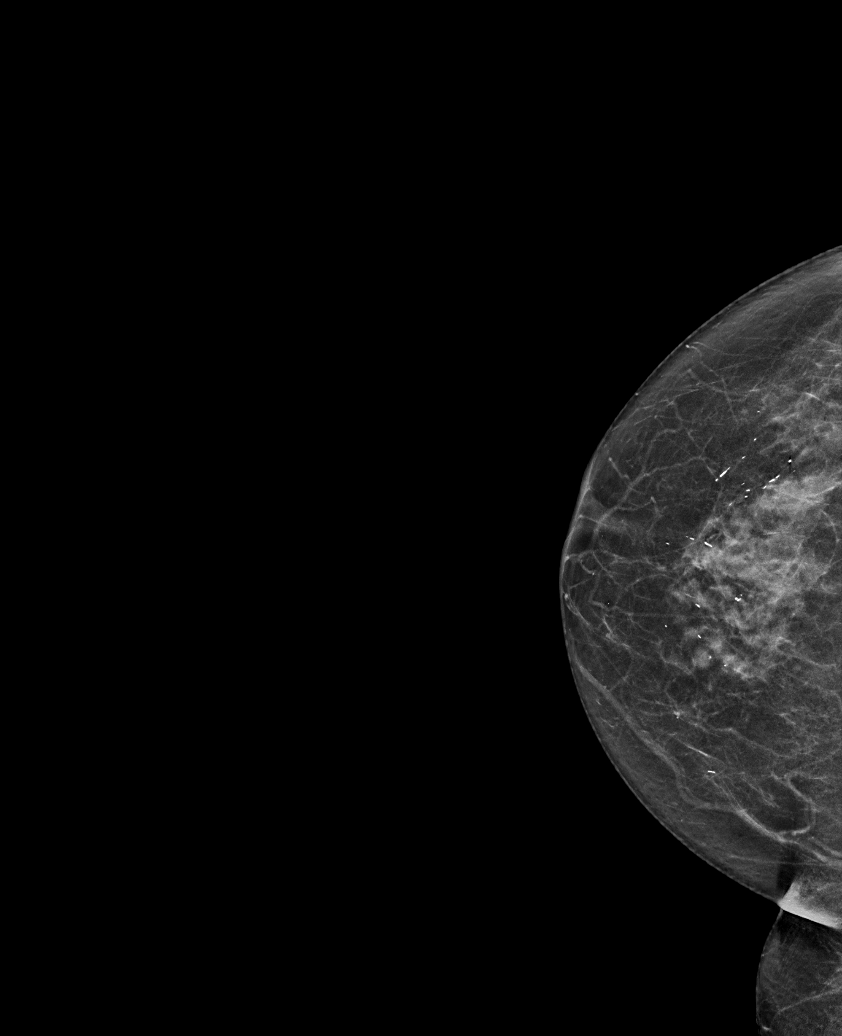

[L MLO synth-2D (1 of 2)]
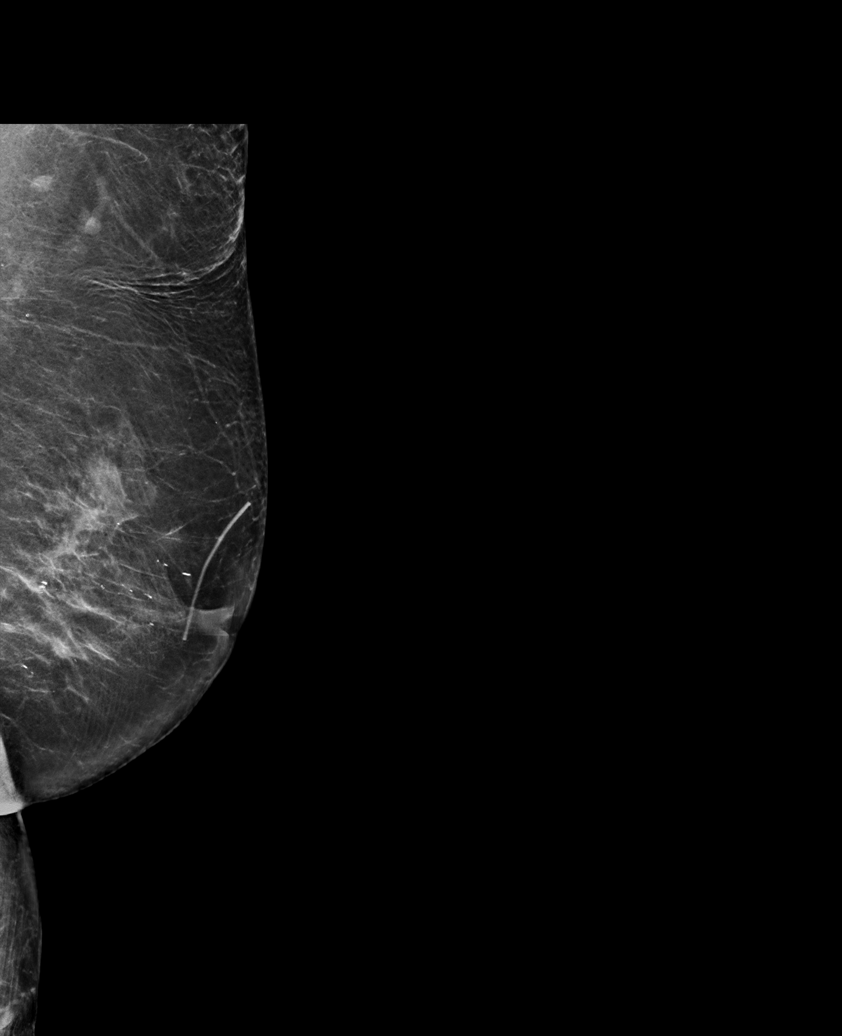

[R MLO synth-2D]
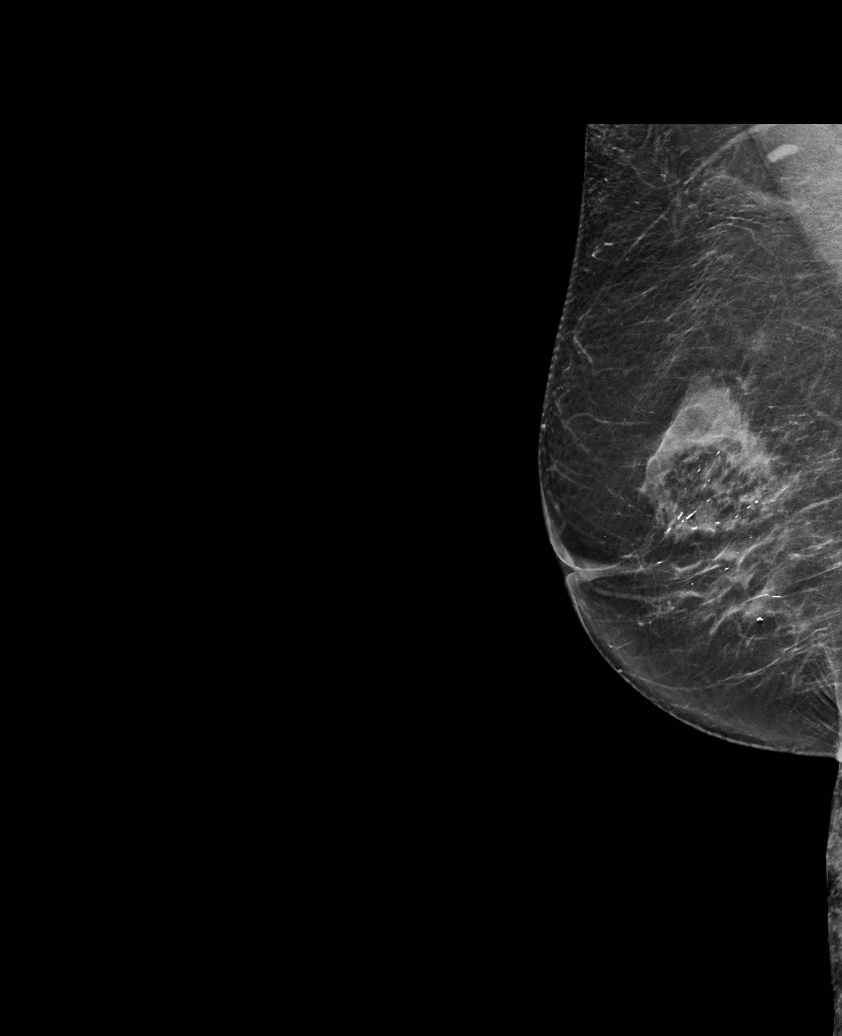

[L MLO synth-2D (2 of 2)]
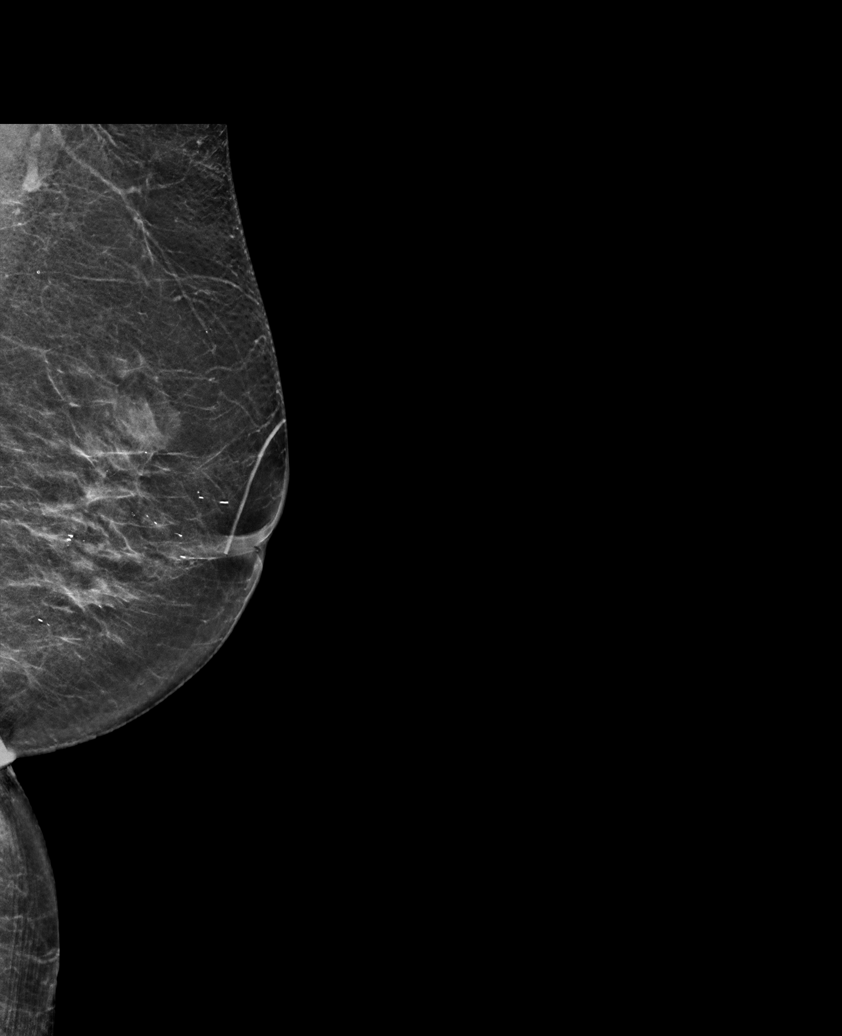

[L CC synth-2D]
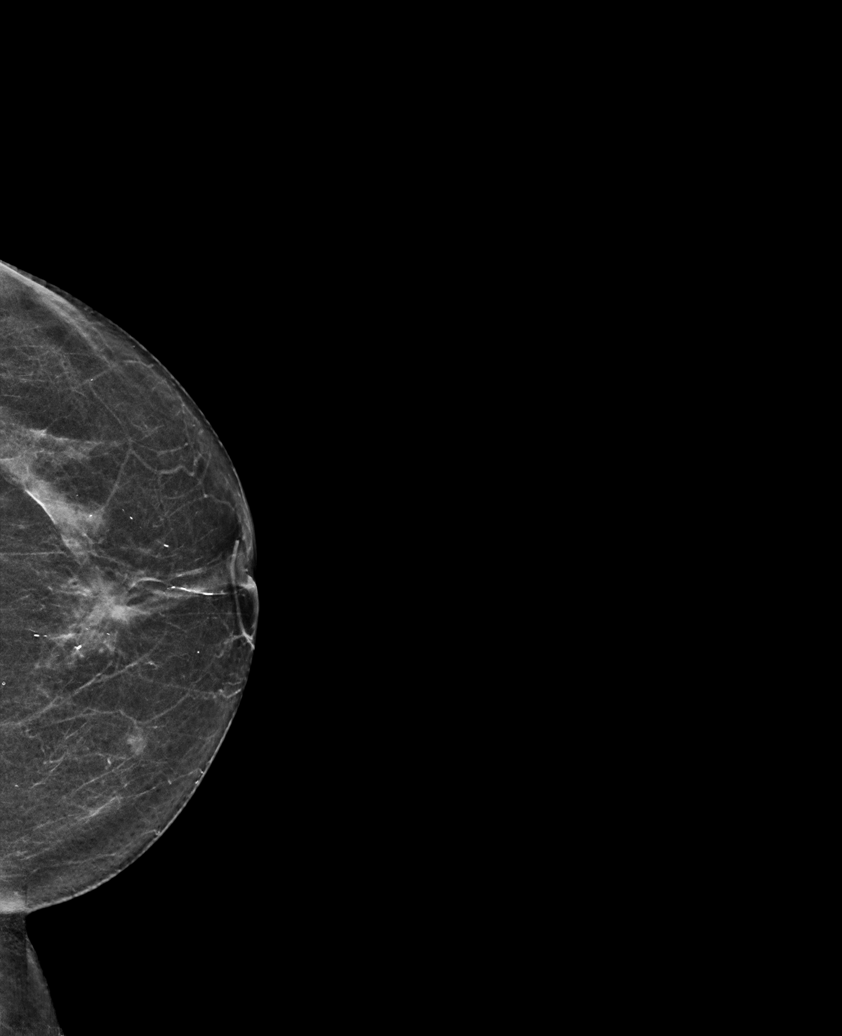

[R CC tomo · tomo slice 30/59.0]
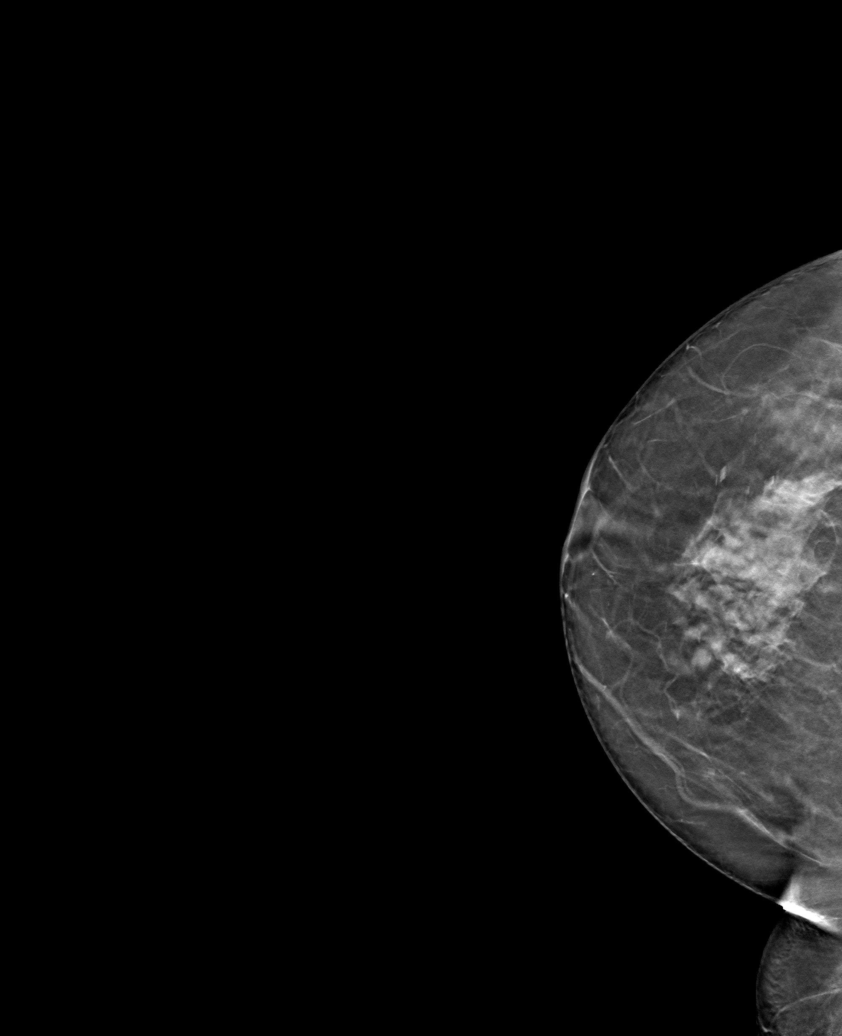

[6 of 30 positions shown; findings below may reference images not displayed]

ACR Breast Density Category c: The breast tissue is heterogeneously
dense, which may obscure small masses.
FINDINGS: There are no findings suspicious for malignancy.
IMPRESSION: No mammographic evidence of malignancy. A result letter of this
screening mammogram will be mailed directly to the patient.

RECOMMENDATION:
Screening mammogram in one year. (Code:Q3-W-BC3)

BI-RADS CATEGORY  1: Negative.
